# Patient Record
Sex: Male | Born: 1950 | Race: White | Hispanic: No | Marital: Single | Smoking: Former smoker
Health system: Southern US, Community
[De-identification: ages and names within clinical notes are randomized; demographics above are authoritative.]

## PROBLEM LIST (undated history)

## (undated) DIAGNOSIS — F1010 Alcohol abuse, uncomplicated: Secondary | ICD-10-CM

## (undated) DIAGNOSIS — I10 Essential (primary) hypertension: Secondary | ICD-10-CM

## (undated) DIAGNOSIS — K859 Acute pancreatitis without necrosis or infection, unspecified: Secondary | ICD-10-CM

## (undated) HISTORY — PX: PANCREAS SURGERY: SHX731

## (undated) HISTORY — PX: KNEE SURGERY: SHX244

---

## 2010-11-09 ENCOUNTER — Emergency Department (HOSPITAL_COMMUNITY)
Admission: EM | Admit: 2010-11-09 | Discharge: 2010-11-09 | Payer: Self-pay | Source: Home / Self Care | Admitting: Emergency Medicine

## 2011-01-17 ENCOUNTER — Emergency Department (HOSPITAL_BASED_OUTPATIENT_CLINIC_OR_DEPARTMENT_OTHER)
Admission: EM | Admit: 2011-01-17 | Discharge: 2011-01-17 | Disposition: A | Discharge status: Home / Self Care | Payer: BC Managed Care – PPO | Attending: Emergency Medicine | Admitting: Emergency Medicine

## 2011-01-17 DIAGNOSIS — R197 Diarrhea, unspecified: Secondary | ICD-10-CM | POA: Insufficient documentation

## 2011-01-17 DIAGNOSIS — R1110 Vomiting, unspecified: Secondary | ICD-10-CM | POA: Insufficient documentation

## 2011-01-17 DIAGNOSIS — F1010 Alcohol abuse, uncomplicated: Secondary | ICD-10-CM | POA: Insufficient documentation

## 2011-01-17 DIAGNOSIS — E860 Dehydration: Secondary | ICD-10-CM | POA: Insufficient documentation

## 2011-01-17 DIAGNOSIS — E119 Type 2 diabetes mellitus without complications: Secondary | ICD-10-CM | POA: Insufficient documentation

## 2011-01-17 DIAGNOSIS — F17200 Nicotine dependence, unspecified, uncomplicated: Secondary | ICD-10-CM | POA: Insufficient documentation

## 2011-01-17 LAB — CBC
HCT: 45 % (ref 39.0–52.0)
MCH: 33 pg (ref 26.0–34.0)
MCV: 90.5 fL (ref 78.0–100.0)
RBC: 4.97 MIL/uL (ref 4.22–5.81)
RDW: 12.3 % (ref 11.5–15.5)
WBC: 7.4 10*3/uL (ref 4.0–10.5)

## 2011-01-17 LAB — URINALYSIS, ROUTINE W REFLEX MICROSCOPIC
Bilirubin Urine: NEGATIVE
Ketones, ur: 15 mg/dL — AB
Protein, ur: 100 mg/dL — AB
Urobilinogen, UA: 0.2 mg/dL (ref 0.0–1.0)

## 2011-01-17 LAB — COMPREHENSIVE METABOLIC PANEL
Alkaline Phosphatase: 188 U/L — ABNORMAL HIGH (ref 39–117)
BUN: 20 mg/dL (ref 6–23)
Chloride: 97 mEq/L (ref 96–112)
Creatinine, Ser: 0.7 mg/dL (ref 0.4–1.5)
Glucose, Bld: 227 mg/dL — ABNORMAL HIGH (ref 70–99)
Potassium: 5 mEq/L (ref 3.5–5.1)
Total Bilirubin: 0.8 mg/dL (ref 0.3–1.2)

## 2011-01-17 LAB — DIFFERENTIAL
Eosinophils Absolute: 0 10*3/uL (ref 0.0–0.7)
Eosinophils Relative: 1 % (ref 0–5)
Lymphocytes Relative: 19 % (ref 12–46)
Lymphs Abs: 1.4 10*3/uL (ref 0.7–4.0)
Monocytes Relative: 9 % (ref 3–12)

## 2011-01-17 LAB — URINE MICROSCOPIC-ADD ON

## 2011-01-17 LAB — GLUCOSE, CAPILLARY
Glucose-Capillary: 246 mg/dL — ABNORMAL HIGH (ref 70–99)
Glucose-Capillary: 198 mg/dL — ABNORMAL HIGH (ref 70–99)

## 2011-01-17 LAB — POCT TOXICOLOGY PANEL

## 2011-01-17 LAB — ETHANOL: Alcohol, Ethyl (B): 241 mg/dL — ABNORMAL HIGH (ref 0–10)

## 2011-01-17 LAB — LIPASE, BLOOD: Lipase: 37 U/L (ref 23–300)

## 2011-01-19 LAB — URINE CULTURE
Colony Count: NO GROWTH
Culture  Setup Time: 201202122012

## 2012-07-06 ENCOUNTER — Inpatient Hospital Stay (HOSPITAL_COMMUNITY)
Admission: EM | Admit: 2012-07-06 | Discharge: 2012-07-11 | DRG: 750 | Disposition: A | Discharge status: Home / Self Care | Payer: BC Managed Care – PPO | Attending: Internal Medicine | Admitting: Internal Medicine

## 2012-07-06 ENCOUNTER — Encounter (HOSPITAL_COMMUNITY): Payer: Self-pay | Admitting: *Deleted

## 2012-07-06 DIAGNOSIS — Z794 Long term (current) use of insulin: Secondary | ICD-10-CM

## 2012-07-06 DIAGNOSIS — F10931 Alcohol use, unspecified with withdrawal delirium: Principal | ICD-10-CM | POA: Diagnosis present

## 2012-07-06 DIAGNOSIS — R195 Other fecal abnormalities: Secondary | ICD-10-CM | POA: Diagnosis present

## 2012-07-06 DIAGNOSIS — J690 Pneumonitis due to inhalation of food and vomit: Secondary | ICD-10-CM | POA: Diagnosis present

## 2012-07-06 DIAGNOSIS — Z9119 Patient's noncompliance with other medical treatment and regimen: Secondary | ICD-10-CM

## 2012-07-06 DIAGNOSIS — F1020 Alcohol dependence, uncomplicated: Secondary | ICD-10-CM | POA: Diagnosis present

## 2012-07-06 DIAGNOSIS — E119 Type 2 diabetes mellitus without complications: Secondary | ICD-10-CM | POA: Diagnosis present

## 2012-07-06 DIAGNOSIS — W64XXXA Exposure to other animate mechanical forces, initial encounter: Secondary | ICD-10-CM | POA: Diagnosis present

## 2012-07-06 DIAGNOSIS — F17200 Nicotine dependence, unspecified, uncomplicated: Secondary | ICD-10-CM | POA: Diagnosis present

## 2012-07-06 DIAGNOSIS — W5503XA Scratched by cat, initial encounter: Secondary | ICD-10-CM

## 2012-07-06 DIAGNOSIS — Z79899 Other long term (current) drug therapy: Secondary | ICD-10-CM

## 2012-07-06 DIAGNOSIS — F1010 Alcohol abuse, uncomplicated: Secondary | ICD-10-CM | POA: Diagnosis present

## 2012-07-06 DIAGNOSIS — Z91199 Patient's noncompliance with other medical treatment and regimen due to unspecified reason: Secondary | ICD-10-CM

## 2012-07-06 DIAGNOSIS — H109 Unspecified conjunctivitis: Secondary | ICD-10-CM | POA: Diagnosis present

## 2012-07-06 DIAGNOSIS — IMO0002 Reserved for concepts with insufficient information to code with codable children: Secondary | ICD-10-CM | POA: Diagnosis present

## 2012-07-06 DIAGNOSIS — E871 Hypo-osmolality and hyponatremia: Secondary | ICD-10-CM | POA: Diagnosis present

## 2012-07-06 DIAGNOSIS — F10231 Alcohol dependence with withdrawal delirium: Principal | ICD-10-CM | POA: Diagnosis present

## 2012-07-06 LAB — CBC WITH DIFFERENTIAL/PLATELET
Basophils Absolute: 0 10*3/uL (ref 0.0–0.1)
Basophils Relative: 1 % (ref 0–1)
Eosinophils Relative: 1 % (ref 0–5)
HCT: 40.8 % (ref 39.0–52.0)
MCHC: 36 g/dL (ref 30.0–36.0)
Monocytes Absolute: 0.3 10*3/uL (ref 0.1–1.0)
Neutro Abs: 3 10*3/uL (ref 1.7–7.7)
Platelets: 138 10*3/uL — ABNORMAL LOW (ref 150–400)
RDW: 12.7 % (ref 11.5–15.5)

## 2012-07-06 LAB — POCT I-STAT, CHEM 8
Calcium, Ion: 0.89 mmol/L — ABNORMAL LOW (ref 1.12–1.23)
Glucose, Bld: 154 mg/dL — ABNORMAL HIGH (ref 70–99)
HCT: 45 % (ref 39.0–52.0)
Hemoglobin: 15.3 g/dL (ref 13.0–17.0)
Potassium: 4.6 mEq/L (ref 3.5–5.1)

## 2012-07-06 MED ORDER — SODIUM CHLORIDE 0.9 % IV BOLUS (SEPSIS)
1000.0000 mL | Freq: Once | INTRAVENOUS | Status: AC
  Administered 2012-07-06: 1000 mL via INTRAVENOUS

## 2012-07-06 NOTE — ED Notes (Signed)
ONG:[id number]<WU> Expected date:<BR> Expected time:<BR> Means of arrival:<BR> Comments:<BR> EMS/found unresponsive

## 2012-07-06 NOTE — ED Notes (Signed)
Per EMS: pt coming from home. Pt works at [employer], employee called 911 because pt had not shown up to work in three days. Upon EMS arrival pt was sitting on couch watching TV. Pt was very lethargic, slowed to respond, cgb 156, smell of ETOH. Pt admitted to 3-4 glasses of wine today. Pt is alert and oriented. EMS reports positive orthostatic changes. Pt given 700 mL of NS en route. Pt has no complaints. EMS reports negative stroke scale

## 2012-07-06 NOTE — ED Provider Notes (Addendum)
History     CSN: [id number]  Arrival date & time 07/06/12  2025   First MD Initiated Contact with Patient 07/06/12 2136      Chief Complaint  Patient presents with  . Dehydration    (Consider location/radiation/quality/duration/timing/severity/associated sxs/prior treatment) HPI Comments: Patient states, that he was placed on FLMA. by his employer.  On Sunday because of labile blood sugars that he is treating on his own by changing his insulin which he states he does not need a prescription for, his employer became concerned because he did not show up to work today or for the past 2, days.  When EMS arrived, they could see the patient.  Sitting, in the chair, but he was not responding to the air.  [place], police arrived, and broke in the door.  Patient was responsive but very blas about the intrusion.  He admits to drinking 3-4 glasses of wine a day.  He denies any fever or headache, weakness, nausea, vomiting, diarrhea, URI symptoms, chest pain, cough.  States his blood sugar has been between 140 and 160.   The history is provided by the patient.    Past Medical History  Diagnosis Date  . Diabetes mellitus     History reviewed. No pertinent past surgical history.  History reviewed. No pertinent family history.  History  Substance Use Topics  . Smoking status: Current Everyday Smoker  . Smokeless tobacco: Not on file  . Alcohol Use: Yes      Review of Systems  Constitutional: Negative for fever.  HENT: Negative for congestion, sore throat and rhinorrhea.   Gastrointestinal: Negative for nausea, vomiting, abdominal pain, diarrhea and constipation.  Genitourinary: Negative for dysuria.  Musculoskeletal: Negative for myalgias and joint swelling.  Skin: Positive for wound.  Neurological: Positive for headaches. Negative for dizziness, speech difficulty and weakness.  Psychiatric/Behavioral: Negative for confusion.    Allergies  Review of patient's allergies indicates no  known allergies.  Home Medications   Current Outpatient Rx  Name Route Sig Dispense Refill  . INSULIN ISOPHANE HUMAN 100 UNIT/ML [place] SUSP Subcutaneous Inject 6 Units into the skin 2 (two) times daily.    . INSULIN REGULAR HUMAN 100 UNIT/ML IJ SOLN Subcutaneous Inject 10 Units into the skin 2 (two) times daily before a meal.      BP 130/69  Pulse 73  Temp 97.9 F (36.6 C) (Oral)  Resp 16  SpO2 95%  Physical Exam  Constitutional: He is oriented to person, place, and time. He appears well-developed and well-nourished. No distress.       Patient is very disheveled  HENT:  Head: Normocephalic.  Eyes: Pupils are equal, round, and reactive to light.  Neck: Normal range of motion.  Cardiovascular: Normal rate.   Pulmonary/Chest: Effort normal.  Abdominal: Soft. There is no tenderness.  Musculoskeletal: Normal range of motion. He exhibits no edema.  Neurological: He is alert and oriented to person, place, and time.  Skin: Skin is warm. He is not diaphoretic.       There are multiple scratches on the anterior surface of the right, forearm.  Patient states this is from his cat  Psychiatric: He has a normal mood and affect.    ED Course  Procedures (including critical care time)  Labs Reviewed  CBC WITH DIFFERENTIAL - Abnormal; Notable for the following:    Platelets 138 (*)     All other components within normal limits  ETHANOL - Abnormal; Notable for the following:  Alcohol, Ethyl (B) 345 (*)     All other components within normal limits  POCT I-STAT, CHEM 8 - Abnormal; Notable for the following:    Sodium 130 (*)     Chloride 94 (*)     Glucose, Bld 154 (*)     Calcium, Ion 0.89 (*)     All other components within normal limits  GLUCOSE, CAPILLARY - Abnormal; Notable for the following:    Glucose-Capillary 153 (*)     All other components within normal limits  ETHANOL   No results found.   No diagnosis found. ED ECG REPORT   Date: 09/07/2012  EKG Time: 8:30 PM   Rate: 85  Rhythm: normal sinus rhythm,  there are no previous tracings available for comparison  Axis: normal  Intervals:none  ST&T Change:borderline T wave changes  Narrative Interpretation: borderline EKG             MDM   Call 345, will hydrate patient and redraw alcohol level in the morning, when he is sober.  Discharge home        Arman Filter, NP 07/07/12 0981  Arman Filter, NP 09/07/12 2031

## 2012-07-07 ENCOUNTER — Encounter (HOSPITAL_COMMUNITY): Payer: Self-pay

## 2012-07-07 DIAGNOSIS — IMO0002 Reserved for concepts with insufficient information to code with codable children: Secondary | ICD-10-CM

## 2012-07-07 DIAGNOSIS — W64XXXA Exposure to other animate mechanical forces, initial encounter: Secondary | ICD-10-CM

## 2012-07-07 DIAGNOSIS — E871 Hypo-osmolality and hyponatremia: Secondary | ICD-10-CM

## 2012-07-07 DIAGNOSIS — F1010 Alcohol abuse, uncomplicated: Secondary | ICD-10-CM | POA: Diagnosis present

## 2012-07-07 DIAGNOSIS — E119 Type 2 diabetes mellitus without complications: Secondary | ICD-10-CM

## 2012-07-07 LAB — URINE MICROSCOPIC-ADD ON

## 2012-07-07 LAB — GLUCOSE, CAPILLARY: Glucose-Capillary: 152 mg/dL — ABNORMAL HIGH (ref 70–99)

## 2012-07-07 LAB — URINALYSIS, ROUTINE W REFLEX MICROSCOPIC
Bilirubin Urine: NEGATIVE
Nitrite: NEGATIVE
Protein, ur: 100 mg/dL — AB
Urobilinogen, UA: 1 mg/dL (ref 0.0–1.0)

## 2012-07-07 MED ORDER — CIPROFLOXACIN HCL 0.3 % OP SOLN
2.0000 [drp] | OPHTHALMIC | Status: DC
  Filled 2012-07-07: qty 2.5

## 2012-07-07 MED ORDER — THIAMINE HCL 100 MG/ML IJ SOLN
INTRAVENOUS | Status: DC
  Administered 2012-07-07: 15:00:00 via INTRAVENOUS
  Filled 2012-07-07 (×2): qty 1000

## 2012-07-07 MED ORDER — ADULT MULTIVITAMIN W/MINERALS CH
1.0000 | ORAL_TABLET | Freq: Every day | ORAL | Status: DC
  Administered 2012-07-07 – 2012-07-11 (×5): 1 via ORAL
  Filled 2012-07-07 (×5): qty 1

## 2012-07-07 MED ORDER — CIPROFLOXACIN HCL 0.3 % OP SOLN
2.0000 [drp] | OPHTHALMIC | Status: DC
  Administered 2012-07-09 – 2012-07-11 (×11): 2 [drp] via OPHTHALMIC
  Filled 2012-07-07: qty 2.5

## 2012-07-07 MED ORDER — AMOXICILLIN-POT CLAVULANATE 875-125 MG PO TABS
1.0000 | ORAL_TABLET | Freq: Once | ORAL | Status: AC
  Administered 2012-07-07: 1 via ORAL
  Filled 2012-07-07: qty 1

## 2012-07-07 MED ORDER — SODIUM CHLORIDE 0.9 % IV SOLN
INTRAVENOUS | Status: DC
  Administered 2012-07-08: 06:00:00 via INTRAVENOUS

## 2012-07-07 MED ORDER — LORAZEPAM 1 MG PO TABS
1.0000 mg | ORAL_TABLET | Freq: Once | ORAL | Status: AC
  Administered 2012-07-07: 1 mg via ORAL
  Filled 2012-07-07: qty 1

## 2012-07-07 MED ORDER — AMOXICILLIN-POT CLAVULANATE 500-125 MG PO TABS: 1.0000 | ORAL_TABLET | Freq: Three times a day (TID) | ORAL | Status: DC

## 2012-07-07 MED ORDER — DEXTROSE 5 % IV SOLN
500.0000 mg | Freq: Once | INTRAVENOUS | Status: AC
  Administered 2012-07-07: 500 mg via INTRAVENOUS
  Filled 2012-07-07: qty 500

## 2012-07-07 MED ORDER — ONDANSETRON HCL 4 MG/2ML IJ SOLN: 4.0000 mg | Freq: Four times a day (QID) | INTRAMUSCULAR | Status: DC | PRN

## 2012-07-07 MED ORDER — TETANUS-DIPHTH-ACELL PERTUSSIS 5-2.5-18.5 LF-MCG/0.5 IM SUSP
0.5000 mL | Freq: Once | INTRAMUSCULAR | Status: AC
  Administered 2012-07-07: 0.5 mL via INTRAMUSCULAR
  Filled 2012-07-07: qty 0.5

## 2012-07-07 MED ORDER — LORAZEPAM 2 MG/ML IJ SOLN
0.0000 mg | Freq: Four times a day (QID) | INTRAMUSCULAR | Status: AC
  Administered 2012-07-07 – 2012-07-08 (×2): 1 mg via INTRAVENOUS
  Filled 2012-07-07 (×2): qty 1

## 2012-07-07 MED ORDER — DEXTROSE 5 % IV SOLN
500.0000 mg | INTRAVENOUS | Status: DC
  Administered 2012-07-08: 500 mg via INTRAVENOUS
  Filled 2012-07-07 (×2): qty 500

## 2012-07-07 MED ORDER — CHLORDIAZEPOXIDE HCL 5 MG PO CAPS
10.0000 mg | ORAL_CAPSULE | Freq: Four times a day (QID) | ORAL | Status: DC
  Administered 2012-07-07 – 2012-07-09 (×11): 10 mg via ORAL
  Filled 2012-07-07 (×2): qty 2
  Filled 2012-07-07 (×4): qty 1
  Filled 2012-07-07: qty 2
  Filled 2012-07-07: qty 1
  Filled 2012-07-07: qty 2
  Filled 2012-07-07: qty 1
  Filled 2012-07-07: qty 2
  Filled 2012-07-07 (×2): qty 1

## 2012-07-07 MED ORDER — HYDROCODONE-ACETAMINOPHEN 5-325 MG PO TABS: 1.0000 | ORAL_TABLET | ORAL | Status: DC | PRN

## 2012-07-07 MED ORDER — SODIUM CHLORIDE 0.9 % IV SOLN
3.0000 g | Freq: Four times a day (QID) | INTRAVENOUS | Status: DC
  Administered 2012-07-07 – 2012-07-09 (×8): 3 g via INTRAVENOUS
  Filled 2012-07-07 (×10): qty 3

## 2012-07-07 MED ORDER — SODIUM CHLORIDE 0.9 % IV SOLN: INTRAVENOUS | Status: DC

## 2012-07-07 MED ORDER — THIAMINE HCL 100 MG/ML IJ SOLN
INTRAVENOUS | Status: DC
  Filled 2012-07-07 (×2): qty 1000

## 2012-07-07 MED ORDER — LORAZEPAM 2 MG/ML IJ SOLN: 1.0000 mg | Freq: Four times a day (QID) | INTRAMUSCULAR | Status: DC | PRN

## 2012-07-07 MED ORDER — SODIUM CHLORIDE 0.9 % IV BOLUS (SEPSIS)
1000.0000 mL | Freq: Once | INTRAVENOUS | Status: AC
  Administered 2012-07-07: 1000 mL via INTRAVENOUS

## 2012-07-07 MED ORDER — SODIUM CHLORIDE 0.9 % IJ SOLN
3.0000 mL | Freq: Two times a day (BID) | INTRAMUSCULAR | Status: DC
  Administered 2012-07-07 – 2012-07-11 (×5): 3 mL via INTRAVENOUS

## 2012-07-07 MED ORDER — FOLIC ACID 1 MG PO TABS
1.0000 mg | ORAL_TABLET | Freq: Every day | ORAL | Status: DC
  Administered 2012-07-07: 1 mg via ORAL
  Filled 2012-07-07: qty 1

## 2012-07-07 MED ORDER — THIAMINE HCL 100 MG/ML IJ SOLN: 100.0000 mg | Freq: Every day | INTRAMUSCULAR | Status: DC

## 2012-07-07 MED ORDER — LORAZEPAM 1 MG PO TABS: 1.0000 mg | ORAL_TABLET | Freq: Four times a day (QID) | ORAL | Status: AC | PRN

## 2012-07-07 MED ORDER — PNEUMOCOCCAL VAC POLYVALENT 25 MCG/0.5ML IJ INJ
0.5000 mL | INJECTION | INTRAMUSCULAR | Status: AC
  Administered 2012-07-08: 0.5 mL via INTRAMUSCULAR
  Filled 2012-07-07: qty 0.5

## 2012-07-07 MED ORDER — CIPROFLOXACIN HCL 0.3 % OP SOLN
2.0000 [drp] | OPHTHALMIC | Status: AC
  Administered 2012-07-07 – 2012-07-09 (×22): 2 [drp] via OPHTHALMIC
  Filled 2012-07-07 (×2): qty 2.5

## 2012-07-07 MED ORDER — GUAIFENESIN-DM 100-10 MG/5ML PO SYRP: 5.0000 mL | ORAL_SOLUTION | ORAL | Status: DC | PRN

## 2012-07-07 MED ORDER — ALBUTEROL SULFATE (5 MG/ML) 0.5% IN NEBU: 2.5000 mg | INHALATION_SOLUTION | RESPIRATORY_TRACT | Status: DC | PRN

## 2012-07-07 MED ORDER — VITAMIN B-1 100 MG PO TABS
100.0000 mg | ORAL_TABLET | Freq: Every day | ORAL | Status: DC
  Administered 2012-07-07: 100 mg via ORAL
  Filled 2012-07-07: qty 1

## 2012-07-07 MED ORDER — LORAZEPAM 2 MG/ML IJ SOLN
0.0000 mg | Freq: Two times a day (BID) | INTRAMUSCULAR | Status: DC
  Administered 2012-07-09: 1 mg via INTRAVENOUS
  Filled 2012-07-07: qty 1

## 2012-07-07 MED ORDER — INSULIN ASPART 100 UNIT/ML ~~LOC~~ SOLN
0.0000 [IU] | SUBCUTANEOUS | Status: DC
  Administered 2012-07-07: 2 [IU] via SUBCUTANEOUS

## 2012-07-07 MED ORDER — LORAZEPAM 2 MG/ML IJ SOLN
1.0000 mg | Freq: Once | INTRAMUSCULAR | Status: AC
  Administered 2012-07-07: 1 mg via INTRAVENOUS
  Filled 2012-07-07: qty 1

## 2012-07-07 MED ORDER — ONDANSETRON HCL 4 MG PO TABS: 4.0000 mg | ORAL_TABLET | Freq: Four times a day (QID) | ORAL | Status: DC | PRN

## 2012-07-07 NOTE — H&P (Addendum)
[facility]                                                                                    Patient Demographics  Benjamin [name], is a 61 y.o. male  CSN: [id number]  MRN: [id number]  DOB - [birth year]  Admit Date - 07/06/2012  Outpatient Primary MD for the patient is No primary provider on file.   With History of -  Past Medical History  Diagnosis Date  . Diabetes mellitus       History reviewed. No pertinent past surgical history.  in for   Chief Complaint  Patient presents with  . Dehydration     HPI  Benjamin [name]  is a 61 y.o. male, history of severe alcohol abuse who drinks about 1-1/2-2 bottles of vodka everyday who was brought by EMS yesterday after he did not show for work for couple of days, his employer then contacted the authorities, EMS and police broke his house door and found patient sitting in a chair did not have any subjective complaints however he appeared to be intoxicated, he was brought to [facility] where his serum alcohol level was 345, he should and does admit to drinking 1-1/2-2 bottles of vodka daily the past several years, he also says that he has been attacked by his cat and that his cat has been eating his flesh from time to time, various scratch marks were noted on his body all over. His lab work was suggestive of hyponatremia mild leukocytosis with possible aspiration pneumonia.  Patient was to be discharged home but he appeared to be going in early DTs, likely due to heavy alcohol intake on a daily basis which has lowered his DT threshold.   Review of Systems  -  although unreliable but current review of systems is negative  In addition to the HPI above,   No Fever-chills, No Headache, No changes with Vision or hearing, No problems swallowing food or Liquids, No Chest pain, Cough or Shortness of Breath, No Abdominal pain, No Nausea or Vommitting, Bowel movements are regular, No Blood in stool or Urine, No dysuria, No new skin  rashes or bruises, No new joints pains-aches,  No new weakness, tingling, numbness in any extremity, No recent weight gain or loss, No polyuria, polydypsia or polyphagia, No significant Mental Stressors.  A full 10 point Review of Systems was done, except as stated above, all other Review of Systems were negative.   Social History History  Substance Use Topics  . Smoking status: Current Everyday Smoker  . Smokeless tobacco: Not on file  . Alcohol Use: Yes     Family History His father was an alcoholic per patient.  Prior to Admission medications   Medication Sig Start Date End Date Taking? Authorizing Provider  insulin NPH (HUMULIN N,NOVOLIN N) 100 UNIT/ML injection Inject 6 Units into the skin 2 (two) times daily.   Yes Historical Provider, MD  insulin regular (NOVOLIN R,HUMULIN R) 100 units/mL injection Inject 10 Units into the skin 2 (two) times daily before a meal.   Yes Historical Provider, MD  amoxicillin-clavulanate (AUGMENTIN) 500-125 MG per tablet Take 1 tablet (500 mg  total) by mouth every 8 (eight) hours. 07/07/12 07/17/12  Dorthula Matas, PA    No Known Allergies  Physical Exam  Vitals  Blood pressure 159/80, pulse 89, temperature 97.9 F (36.6 C), temperature source Oral, resp. rate 21, SpO2 98.00%.   1. General poorly kept middle-aged Caucasian male lying in bed in NAD,    2. Normal affect and insight, Not Suicidal or Homicidal, Awake Alert, Oriented X 2  3. No F.N deficits, ALL C.Nerves Intact, Strength 5/5 all 4 extremities, Sensation intact all 4 extremities, Plantars down going.  4. Ears appear Normal, he has evidence of bilateral conjunctivitis with severe crusting in both eyes, Conjunctivae clear, PERRLA. Moist Oral Mucosa.  5. Supple Neck, No JVD, No cervical lymphadenopathy appriciated, No Carotid Bruits.  6. Symmetrical Chest wall movement, Good air movement bilaterally, CTAB.  7. RRR, No Gallops, Rubs or Murmurs, No Parasternal Heave.  8.  Positive Bowel Sounds, Abdomen Soft, Non tender, No organomegaly appriciated,No rebound -guarding or rigidity.  9.  No Cyanosis, Normal Skin Turgor, multiple chronic skin aberrations noted in all 4 extremities mostly in his right arm  10. Good muscle tone,  joints appear normal , no effusions, Normal ROM.  11. No Palpable Lymph Nodes in Neck or Axillae    Data Review  CBC  Lab 07/06/12 2232 07/06/12 2215  WBC -- 4.2  HGB 15.3 14.7  HCT 45.0 40.8  PLT -- 138*  MCV -- 90.5  MCH -- 32.6  MCHC -- 36.0  RDW -- 12.7  LYMPHSABS -- 0.8  MONOABS -- 0.3  EOSABS -- 0.0  BASOSABS -- 0.0  BANDABS -- --   ------------------------------------------------------------------------------------------------------------------  Chemistries   Lab 07/06/12 2232  NA 130*  K 4.6  CL 94*  CO2 --  GLUCOSE 154*  BUN 11  CREATININE 1.20  CALCIUM --  MG --  AST --  ALT --  ALKPHOS --  BILITOT --   ------------------------------------------------------------------------------------------------------------------ CrCl is unknown because there is no height on file for the current visit. ------------------------------------------------------------------------------------------------------------------ No results found for this basename: TSH,T4TOTAL,FREET3,T3FREE,THYROIDAB in the last 72 hours   Coagulation profile No results found for this basename: INR:5,PROTIME:5 in the last 168 hours ------------------------------------------------------------------------------------------------------------------- No results found for this basename: DDIMER:2 in the last 72 hours -------------------------------------------------------------------------------------------------------------------  Cardiac Enzymes No results found for this basename: CK:3,CKMB:3,TROPONINI:3,MYOGLOBIN:3 in the last 168  hours ------------------------------------------------------------------------------------------------------------------ No components found with this basename: POCBNP:3   ---------------------------------------------------------------------------------------------------------------  Urinalysis    Component Value Date/Time   COLORURINE YELLOW 01/17/2011 1427   APPEARANCEUR CLEAR 01/17/2011 1427   LABSPEC 1.016 01/17/2011 1427   PHURINE 5.0 01/17/2011 1427   HGBUR MODERATE* 01/17/2011 1427   BILIRUBINUR NEGATIVE 01/17/2011 1427   KETONESUR 15* 01/17/2011 1427   PROTEINUR 100* 01/17/2011 1427   UROBILINOGEN 0.2 01/17/2011 1427   NITRITE NEGATIVE 01/17/2011 1427   LEUKOCYTESUR NEGATIVE 01/17/2011 1427       Assessment & Plan   1. Early DTs in a patient who drinks heavy amounts of alcohol on a daily basis- plan is to keep the patient and step-down overnight, he will be placed on IV banana bag, on scheduled oral Librium along with IV Ativan per CIWA protocol, fall and aspiration precautions were now n.p.o. except for medications. Have counseled the patient to quit alcohol and smoking.    2. Aspiration pneumonia - continue aspiration precautions, n.p.o. except medications, will place him on IV Unasyn and azithromycin. He is afebrile if temperature is over 100 blood cultures will be obtained,  sputum cultures if he produces sputum.   3. Diabetes mellitus type 2- for now every 6 hours sliding scale insulin.   4. Hyponatremia- secondary to heavy alcohol use, will check serum osmolality, urine osmolality and urine sodium, gentle IV fluids, repeat BMP in the morning.   5. Severe bilateral conjunctivitis- patient will be placed on Cipro eyedrops every 2 hours while awake.    DVT Prophylaxis  SCDs   AM Labs Ordered, also please review Full Orders  Family Communication: Admission, patients condition and plan of care including tests being ordered have been discussed with the patient who  indicates understanding and agree with the plan and Code Status.  Code Status full  Disposition Plan: home  Time spent in minutes : 35  Condition GUARDED   Susa Raring K M.D on 07/07/2012 at 1:50 PM  Between 7am to 7pm - Pager - [phone]  After 7pm go to [email or web address] - password [email or web address]  And look for the night coverage person covering me after hours  [facility] Office  [phone]

## 2012-07-07 NOTE — Consult Note (Signed)
WOC consult Note Reason for Consult:multiple scratches and abraisons (Patient states he has a cat which occasionally scratches and bites him.  The cat has had "all of its shots".) Noted that patient has poor hygiene and will benefit from our standard care provision of bathing and moisturzing. Wound type:traumatic Pressure Ulcer POA: No Measurement:4cm x 2.5cm x .2cm Wound RUE:[id number] thickness skin injury, moist, pink, granulating Drainage (amount, consistency, odor) scant amount serosanguinous exudate Periwound:intact with scattered healing scratches noted. Dressing procedure/placement/frequency: a moisture retentive dressing is indicated over the most severe of scratches-the right FA presents with partial thickness tissue loss.  I will continue the previously placed petrolatum dressing and have ordered the same, daily.  Other superficial areas will benefit from cleansing and lubrication using our house emollient products. I will not follow.  Please re-consult if needed. Thanks, Ladona Mow, MSN, RN, [facility], CWOCN [phone])

## 2012-07-07 NOTE — Progress Notes (Signed)
Benjamin [name], is a 61 y.o. male,   MRN: [id number]  -  DOB - 07-19-1951  Outpatient Primary MD for the patient is No primary provider on file.  in for    Chief Complaint  Patient presents with  . Dehydration     Blood pressure 159/80, pulse 89, temperature 97.9 F (36.6 C), temperature source Oral, resp. rate 21, SpO2 98.00%.  Active Problems:  Hyponatremia  ETOH abuse  Diabetes mellitus  61 yo hx DM, was found in home yesterday with altered mental status. Had not been to work for 3 days so employer sent someone to home to check on him. Was found altered and transported to ED. ETOH level 345. Given fluids and plan was to discharge in am. This am developed tremors, LE weakness, HTN, tachycardia, agitation. CIWA protocol initiated. In addition, sodium 130. Pt also with several wounds to arms reportedly from cat scratches and bites. Augmentin started in Ed.   Will admit to SD   [name], NP

## 2012-07-07 NOTE — Progress Notes (Addendum)
ANTIBIOTIC CONSULT NOTE - INITIAL  Pharmacy Consult for Unasyn Indication: r/o PNA (possible aspiration)  No Known Allergies  Patient Measurements:     Vital Signs: BP: 159/80 mmHg (08/02 1310) Pulse Rate: 89  (08/02 1310) Intake/Output from previous day:   Intake/Output from this shift:    Labs:  [facility] 07/06/12 2232 07/06/12 2215  WBC -- 4.2  HGB 15.3 14.7  PLT -- 138*  LABCREA -- --  CREATININE 1.20 --   CrCl is unknown because there is no height on file for the current visit. No results found for this basename: VANCOTROUGH:2,VANCOPEAK:2,VANCORANDOM:2,GENTTROUGH:2,GENTPEAK:2,GENTRANDOM:2,TOBRATROUGH:2,TOBRAPEAK:2,TOBRARND:2,AMIKACINPEAK:2,AMIKACINTROU:2,AMIKACIN:2, in the last 72 hours   Microbiology: No results found for this or any previous visit (from the past 720 hour(s)).  Medical History: Past Medical History  Diagnosis Date  . Diabetes mellitus     Medications:  Anti-infectives     Start     Dose/Rate Route Frequency Ordered Stop   07/07/12 1400   Ampicillin-Sulbactam (UNASYN) 3 g in sodium chloride 0.9 % 100 mL IVPB        3 g 100 mL/hr over 60 Minutes Intravenous Every 6 hours 07/07/12 1350     07/07/12 0900  amoxicillin-clavulanate (AUGMENTIN) 875-125 MG per tablet 1 tablet       1 tablet Oral  Once 07/07/12 0856 07/07/12 0915   07/07/12 0000   amoxicillin-clavulanate (AUGMENTIN) 500-125 MG per tablet  Status:  Discontinued        1 tablet Oral Every 8 hours 07/07/12 0953 07/07/12    07/07/12 0000  amoxicillin-clavulanate (AUGMENTIN) 500-125 MG per tablet       1 tablet Oral Every 8 hours 07/07/12 0955 07/17/12 2359         Assessment:  61 yo male found at home, intoxicated, semi-responsive.   Numerous cat bites/scratches noted. Pt has hx DM  Unasyn ordered for r/o PNA, risk of aspiration with intoxication.  Renal fx wnl, Cl about 67 ml/min  Augmentin 875 mg po given in ED this am  Unable to d/c patient due to tremors. Admit for  detox.  Goal of Therapy:  Medication dose/schedule appropriate for renal function, treatment.  Plan:  Unasyn 3gm q6h Follow renal function  Otho Bellows PharmD Pager [phone] 07/07/2012,1:52 PM   RN released consult which stated patient needs Unasyn and Azithromycin for CAP with aspiration.  Patient already on Unasyn as above and Azithromycin 500 mg IV x 1 ordered by MD for 1400 today (not charted as given yet).  Plan: Azithromycin 500 mg IV q24h (first dose tomorrow at 1400).  F/u LOT.   Geoffry Paradise, PharmD, BCPS Pager: [phone] 3:15 PM

## 2012-07-07 NOTE — ED Notes (Signed)
Pt still shaking to the point he is unable to walk.  Could get to standing position, but was unsteady and fell back onto bed after one step.  Will inform PA.

## 2012-07-07 NOTE — ED Provider Notes (Signed)
Contact info from patients employer Emergency Contact listed in his applications Name unknown (sister maybe?) [phone]  Employer ; [employer]: [phone]     Pt handed off to me by Sabino Dick, NP.  Pt brought to ER by EMS after employer became concerned about him not showing up to work for three days. Pt was drunk in house. Upon arrival to ED very intoxicated. Cat scratches to arms noted. Pt drinks 1 bottle of pinot Grigio per day.  9:41 AM 07/07/2012  Pt says he is feeling shaky, i believe he is withdrawing from alcohol. Symptoms are very mild. Will order Ativan. Pt informs me that his cat is weird about his cat food and whenever his bowl is empty he chew and scratches on his arms. Pt is not currently taking abx and tetanus status is unknown. Pt is diabetic as well, which is concerning for multiple wounds to arms. I offered patient detox from alcohol and he declined but will think about it. Pt given resources for alcohol detox.   [employer] informed me that pt had streaks of blood in stool. Hemoccult test ordered and it is positive. Hemoglobin is 14.7. Will give patient referral to GI. No abdominal pain.    Given in ED by me: Augmentin Tetanus shot Ativan   Dx: Cat scratches Alcohol abuse Diabetes Type I Hemoccult positive   12:30 PM When patient was supposed to be discharged the patient was too shaky and unable to walk. His ride, which is his employer does not feel comfortable taking him home. Pt unable to ambulate, therefore being admitted for alcohol withdrawal.   I have initiated withdrawal protocol.   Admit to [facility], Team 8, Dr. Virginia Rochester, Step-Down   Dorthula Matas, PA 07/07/12 [address], [place] 07/07/12 1651

## 2012-07-07 NOTE — ED Notes (Signed)
Pt shaking unable to walk at this time.

## 2012-07-08 LAB — GLUCOSE, CAPILLARY
Glucose-Capillary: 113 mg/dL — ABNORMAL HIGH (ref 70–99)
Glucose-Capillary: 114 mg/dL — ABNORMAL HIGH (ref 70–99)
Glucose-Capillary: 195 mg/dL — ABNORMAL HIGH (ref 70–99)

## 2012-07-08 LAB — HEMOGLOBIN A1C
Hgb A1c MFr Bld: 8.4 % — ABNORMAL HIGH (ref <5.7)
Mean Plasma Glucose: 194 mg/dL — ABNORMAL HIGH (ref <117)

## 2012-07-08 LAB — CBC
HCT: 33.9 % — ABNORMAL LOW (ref 39.0–52.0)
Hemoglobin: 12.1 g/dL — ABNORMAL LOW (ref 13.0–17.0)
MCH: 32.8 pg (ref 26.0–34.0)
MCHC: 35.5 g/dL (ref 30.0–36.0)
MCV: 92.4 fL (ref 78.0–100.0)

## 2012-07-08 LAB — OSMOLALITY: Osmolality: 274 mOsm/kg — ABNORMAL LOW (ref 275–300)

## 2012-07-08 LAB — BASIC METABOLIC PANEL
BUN: 9 mg/dL (ref 6–23)
Chloride: 91 mEq/L — ABNORMAL LOW (ref 96–112)
Glucose, Bld: 115 mg/dL — ABNORMAL HIGH (ref 70–99)
Potassium: 3.4 mEq/L — ABNORMAL LOW (ref 3.5–5.1)

## 2012-07-08 LAB — OSMOLALITY, URINE: Osmolality, Ur: 492 mOsm/kg (ref 390–1090)

## 2012-07-08 LAB — TSH: TSH: 2.164 u[IU]/mL (ref 0.350–4.500)

## 2012-07-08 MED ORDER — CLONIDINE HCL 0.2 MG PO TABS
0.2000 mg | ORAL_TABLET | Freq: Three times a day (TID) | ORAL | Status: DC
  Administered 2012-07-08 – 2012-07-09 (×2): 0.2 mg via ORAL
  Filled 2012-07-08 (×4): qty 1

## 2012-07-08 MED ORDER — FUROSEMIDE 20 MG PO TABS
20.0000 mg | ORAL_TABLET | Freq: Once | ORAL | Status: AC
  Administered 2012-07-08: 20 mg via ORAL
  Filled 2012-07-08: qty 1

## 2012-07-08 MED ORDER — INSULIN ASPART 100 UNIT/ML ~~LOC~~ SOLN
0.0000 [IU] | Freq: Three times a day (TID) | SUBCUTANEOUS | Status: DC
  Administered 2012-07-08: 2 [IU] via SUBCUTANEOUS
  Administered 2012-07-08 – 2012-07-09 (×2): 5 [IU] via SUBCUTANEOUS
  Administered 2012-07-09: 2 [IU] via SUBCUTANEOUS
  Administered 2012-07-09: 5 [IU] via SUBCUTANEOUS
  Administered 2012-07-10: 9 [IU] via SUBCUTANEOUS
  Administered 2012-07-10 (×2): 3 [IU] via SUBCUTANEOUS
  Administered 2012-07-11: 5 [IU] via SUBCUTANEOUS
  Administered 2012-07-11: 2 [IU] via SUBCUTANEOUS

## 2012-07-08 MED ORDER — MAGNESIUM SULFATE IN D5W 10-5 MG/ML-% IV SOLN
1.0000 g | Freq: Once | INTRAVENOUS | Status: AC
  Administered 2012-07-08: 1 g via INTRAVENOUS
  Filled 2012-07-08: qty 100

## 2012-07-08 MED ORDER — VITAMIN B-1 100 MG PO TABS
100.0000 mg | ORAL_TABLET | Freq: Every day | ORAL | Status: DC
  Administered 2012-07-08 – 2012-07-11 (×4): 100 mg via ORAL
  Filled 2012-07-08 (×4): qty 1

## 2012-07-08 MED ORDER — FOLIC ACID 1 MG PO TABS
1.0000 mg | ORAL_TABLET | Freq: Every day | ORAL | Status: DC
  Administered 2012-07-08 – 2012-07-11 (×4): 1 mg via ORAL
  Filled 2012-07-08 (×4): qty 1

## 2012-07-08 MED ORDER — DEXTROSE 50 % IV SOLN
INTRAVENOUS | Status: AC
  Administered 2012-07-08: 25 mL
  Filled 2012-07-08: qty 50

## 2012-07-08 NOTE — Evaluation (Signed)
Clinical/Bedside Swallow Evaluation Patient Details  Name: Benjamin [name] MRN: [id number] Date of Birth: 03-May-1951  Today's Date: 07/08/2012 Time: 1150- 1200; 10 minutes    Past Medical History:  Past Medical History  Diagnosis Date  . Diabetes mellitus    Past Surgical History: History reviewed. No pertinent past surgical history. HPI:  61 y.o. male, history of severe alcohol abuse with early alcohol withdrawal, aspiration pna, hyponatremia.     Assessment / Plan / Recommendation Clinical Impression  Pt with no evidence of an oropharyngeal dysphagia as an etiology of possible aspiration pna.  Rec continuing current diet/thin liquids.  no f/u recommended.    Diet Recommendation Regular;Thin liquid   Medication Administration: Whole meds with liquid Supervision: Patient able to self feed    Other  Recommendations   none  Follow Up Recommendations  None     Benjamin [name] 07/08/2012,12:00 PM

## 2012-07-08 NOTE — Progress Notes (Signed)
[facility]                                                                                Patient Demographics  Benjamin [name], is a 61 y.o. male  YTK:[id number]  [id number]  DOB - [birth year]  Admit date - 07/06/2012  Admitting Physician Hollice Espy, MD  Outpatient Primary MD for the patient is No primary provider on file.  LOS - 2   Chief Complaint  Patient presents with  . Dehydration        Assessment & Plan    1. Early DTs in a patient who drinks heavy amounts of alcohol on a daily basis- much better on Po Librium, no IV Ativan needed, change to Po Vitamins, CIWA protocol.    2. Aspiration pneumonia - continue aspiration precautions, more alert awake, Po diet with assistance, will place him on IV Unasyn and azithromycin. Follow cultures.     3. Diabetes mellitus type 2- change to QAC-HS sliding scale.     4. Hyponatremia- secondary to heavy alcohol use, Ur Na >100, stop IVF, fluid restriction, gentle lasix x 1.     5. Severe bilateral conjunctivitis- patient will be placed on Cipro eyedrops continue.Imrpoved     6. One loose BM last night - apparently C. difficile PCR has been sent to rule out C. Difficile, no further loose stools since then, doubt C. difficile clinically we'll monitor.     7. Multiple skin abrasions continue wound care as before. Wound care following.     Code Status: Full  Family Communication: Fair  Disposition Plan: Home    Procedures    Consults  None   Antibiotics  Unasyn and azithro  - 07-07-12   Time Spent in minutes  35   DVT Prophylaxis  SCDs     Susa Raring K M.D on 07/08/2012 at 7:35 AM  Between 7am to 7pm - Pager - [phone]  After 7pm go to [email or web address] - password [email or web address]  And look for the night coverage person covering for me after hours  [facility] Office  [phone]    Subjective:   Benjamin [name] today has, No headache, No chest pain, No abdominal  pain - No Nausea, No new weakness tingling or numbness, No Cough - SOB. 1 loose BM 07-07-12  Objective:   Filed Vitals:   07/07/12 2200 07/08/12 0000 07/08/12 0400 07/08/12 0600  BP: 132/70 144/67 140/72 143/68  Pulse: 82 72 67 74  Temp:  99.4 F (37.4 C) 98.2 F (36.8 C)   TempSrc:  Oral Oral   Resp: 17 18 17 16   Height:      Weight:  69.1 kg (152 lb 5.4 oz)    SpO2: 97% 96% 98% 97%    Wt Readings from Last 3 Encounters:  07/08/12 69.1 kg (152 lb 5.4 oz)     Intake/Output Summary (Last 24 hours) at 07/08/12 0735 Last data filed at 07/08/12 0600  Gross per 24 hour  Intake   1400 ml  Output   1850 ml  Net   -450 ml    Exam Awake Alert, Oriented X 3, No new F.N deficits, Normal affect [place].AT,PERRAL Supple Neck,No  JVD, No cervical lymphadenopathy appriciated.  Symmetrical Chest wall movement, Good air movement bilaterally, CTAB RRR,No Gallops,Rubs or new Murmurs, No Parasternal Heave +ve B.Sounds, Abd Soft, Non tender, No organomegaly appriciated, No rebound - guarding or rigidity. No Cyanosis, Clubbing or edema, No new Rash or bruise, multiple abrasions stable   Data Review   CBC  Lab 07/08/12 0345 07/06/12 2232 07/06/12 2215  WBC 3.4* -- 4.2  HGB 12.1* 15.3 14.7  HCT 33.9* 45.0 40.8  PLT 83* -- 138*  MCV 92.4 -- 90.5  MCH 32.8 -- 32.6  MCHC 35.5 -- 36.0  RDW 12.8 -- 12.7  LYMPHSABS -- -- 0.8  MONOABS -- -- 0.3  EOSABS -- -- 0.0  BASOSABS -- -- 0.0  BANDABS -- -- --    Chemistries   Lab 07/08/12 0345 07/07/12 1510 07/06/12 2232  NA 128* -- 130*  K 3.4* -- 4.6  CL 91* -- 94*  CO2 21 -- --  GLUCOSE 115* -- 154*  BUN 9 -- 11  CREATININE 0.47* -- 1.20  CALCIUM 7.7* -- --  MG -- 1.7 --  AST -- -- --  ALT -- -- --  ALKPHOS -- -- --  BILITOT -- -- --   ------------------------------------------------------------------------------------------------------------------ estimated creatinine clearance is 97.2 ml/min (by C-G formula based on Cr of  0.47). ------------------------------------------------------------------------------------------------------------------  [facility] 07/07/12 1510  HGBA1C 8.4*   ------------------------------------------------------------------------------------------------------------------ No results found for this basename: CHOL:2,HDL:2,LDLCALC:2,TRIG:2,CHOLHDL:2,LDLDIRECT:2 in the last 72 hours ------------------------------------------------------------------------------------------------------------------  Basename 07/07/12 1510  TSH 2.164  T4TOTAL --  T3FREE --  THYROIDAB --   ------------------------------------------------------------------------------------------------------------------ No results found for this basename: VITAMINB12:2,FOLATE:2,FERRITIN:2,TIBC:2,IRON:2,RETICCTPCT:2 in the last 72 hours  Coagulation profile  Lab 07/08/12 0345  INR 0.96  PROTIME --    No results found for this basename: DDIMER:2 in the last 72 hours  Cardiac Enzymes No results found for this basename: CK:3,CKMB:3,TROPONINI:3,MYOGLOBIN:3 in the last 168 hours ------------------------------------------------------------------------------------------------------------------ No components found with this basename: POCBNP:3  Micro Results Recent Results (from the past 240 hour(s))  MRSA PCR SCREENING     Status: Normal   Collection Time   07/07/12  2:19 PM      Component Value Range Status Comment   MRSA by PCR NEGATIVE  NEGATIVE Final     Radiology Reports No results found.  Scheduled Meds:   . amoxicillin-clavulanate  1 tablet Oral Once  . ampicillin-sulbactam (UNASYN) IV  3 g Intravenous Q6H  . azithromycin  500 mg Intravenous Once  . azithromycin  500 mg Intravenous Q24H  . chlordiazePOXIDE  10 mg Oral QID  . ciprofloxacin  2 drop Both Eyes Q2H  . ciprofloxacin  2 drop Both Eyes Q4H  . dextrose      . insulin aspart  0-9 Units Subcutaneous Q4H  . LORazepam  0-4 mg Intravenous Q6H    Followed by  . LORazepam  0-4 mg Intravenous Q12H  . LORazepam  1 mg Intravenous Once  . LORazepam  1 mg Oral Once  . LORazepam  1 mg Oral Once  . magnesium sulfate 1 - 4 g bolus IVPB  1 g Intravenous Once  . multivitamin with minerals  1 tablet Oral Daily  . pneumococcal 23 valent vaccine  0.5 mL Intramuscular Tomorrow-1000  . banana bag IV fluid 1000 mL   Intravenous Q24H  . sodium chloride  3 mL Intravenous Q12H  . TDaP  0.5 mL Intramuscular Once  . DISCONTD: sodium chloride   Intravenous STAT  . DISCONTD: sodium chloride   Intravenous Q24H  .  DISCONTD: ciprofloxacin  2 drop Both Eyes Q2H  . DISCONTD: folic acid  1 mg Oral Daily  . DISCONTD: thiamine  100 mg Intravenous Daily  . DISCONTD: thiamine  100 mg Oral Daily   Continuous Infusions:   . DISCONTD: banana bag IV fluid 1000 mL 100 mL/hr at 07/07/12 1500  . DISCONTD: banana bag IV fluid 1000 mL     PRN Meds:.albuterol, guaiFENesin-dextromethorphan, HYDROcodone-acetaminophen, LORazepam, LORazepam, ondansetron (ZOFRAN) IV, ondansetron

## 2012-07-08 NOTE — Progress Notes (Addendum)
Benjamin [name] 1240 TRANSFERRED TO 1431- NO CHANGE IN CONDITION- FOR TELE.- ON RA. REPORT CALLED TO TESS RN. C-DIFF NEG.- ? PINK EYE- ON CONTACT.

## 2012-07-09 LAB — BASIC METABOLIC PANEL
BUN: 10 mg/dL (ref 6–23)
GFR calc non Af Amer: >90 mL/min (ref >90)
Glucose, Bld: 209 mg/dL — ABNORMAL HIGH (ref 70–99)
Potassium: 3.1 mEq/L — ABNORMAL LOW (ref 3.5–5.1)

## 2012-07-09 LAB — GLUCOSE, CAPILLARY
Glucose-Capillary: 179 mg/dL — ABNORMAL HIGH (ref 70–99)
Glucose-Capillary: 285 mg/dL — ABNORMAL HIGH (ref 70–99)

## 2012-07-09 LAB — OSMOLALITY: Osmolality: 277 mOsm/kg (ref 275–300)

## 2012-07-09 LAB — SODIUM, URINE, RANDOM: Sodium, Ur: 106 mEq/L

## 2012-07-09 MED ORDER — AMOXICILLIN-POT CLAVULANATE 875-125 MG PO TABS
1.0000 | ORAL_TABLET | Freq: Two times a day (BID) | ORAL | Status: DC
  Administered 2012-07-09 – 2012-07-11 (×5): 1 via ORAL
  Filled 2012-07-09 (×6): qty 1

## 2012-07-09 MED ORDER — GATORADE (BH): 240.0000 mL | Freq: Four times a day (QID) | ORAL | Status: DC

## 2012-07-09 MED ORDER — FUROSEMIDE 10 MG/ML IJ SOLN
20.0000 mg | Freq: Once | INTRAMUSCULAR | Status: AC
  Administered 2012-07-09: 20 mg via INTRAVENOUS
  Filled 2012-07-09: qty 2

## 2012-07-09 MED ORDER — CLONIDINE HCL 0.1 MG PO TABS
0.1000 mg | ORAL_TABLET | Freq: Three times a day (TID) | ORAL | Status: DC
  Administered 2012-07-09: 1 mg via ORAL
  Administered 2012-07-09 – 2012-07-11 (×4): 0.1 mg via ORAL
  Filled 2012-07-09 (×8): qty 1

## 2012-07-09 MED ORDER — AZITHROMYCIN 250 MG PO TABS
250.0000 mg | ORAL_TABLET | Freq: Every day | ORAL | Status: DC
  Administered 2012-07-09 – 2012-07-11 (×3): 250 mg via ORAL
  Filled 2012-07-09 (×3): qty 1

## 2012-07-09 MED ORDER — MAGNESIUM SULFATE IN D5W 10-5 MG/ML-% IV SOLN
1.0000 g | Freq: Once | INTRAVENOUS | Status: AC
  Administered 2012-07-09: 1 g via INTRAVENOUS
  Filled 2012-07-09: qty 100

## 2012-07-09 MED ORDER — POTASSIUM CHLORIDE CRYS ER 20 MEQ PO TBCR
40.0000 meq | EXTENDED_RELEASE_TABLET | Freq: Two times a day (BID) | ORAL | Status: AC
  Administered 2012-07-09 (×2): 40 meq via ORAL
  Filled 2012-07-09 (×2): qty 2

## 2012-07-09 MED ORDER — MAGNESIUM SULFATE 50 % IJ SOLN: 1.0000 g | Freq: Once | INTRAMUSCULAR | Status: DC

## 2012-07-09 NOTE — Progress Notes (Signed)
[facility]                                                                                Patient Demographics  Benjamin [name], is a 61 y.o. male  ZHY:[id number]  [id number]:[id number]  DOB - [birth year]  Admit date - 07/06/2012  Admitting Physician Hollice Espy, MD  Outpatient Primary MD for the patient is No primary provider on file.  LOS - 3   Chief Complaint  Patient presents with  . Dehydration        Assessment & Plan    1. Early DTs in a patient who drinks heavy amounts of alcohol on a daily basis- much better on Po Librium, no IV Ativan needed, change to Po Vitamins, CIWA protocol. Much stable DT seem to be in good control.     2. Aspiration pneumonia - continue aspiration precautions, more alert awake, Po diet with assistance, will place him on IV Unasyn and azithromycin. Follow cultures.     3. Diabetes mellitus type 2- change to QAC-HS sliding scale.  CBG (last 3)   Basename 07/09/12 0805 07/08/12 2041 07/08/12 1644  GLUCAP 179* 227* 257*    Lab Results  Component Value Date   HGBA1C 8.4* 07/07/2012        4. 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    5. Severe bilateral conjunctivitis- patient will be placed on Cipro eyedrops continue, much improved now.     6. One loose BM last night - resolved, C. difficile negative.    7. Multiple skin abrasions continue wound care as before. Wound care following.     Code Status: Full  Family Communication: Fair  Disposition Plan: Home    Procedures    Consults  None   Antibiotics  Unasyn and azithro  - 07-07-12   Time Spent in minutes  35   DVT Prophylaxis  SCDs     Susa Raring K M.D on 07/09/2012 at 11:18  AM  Between 7am to 7pm - Pager - [phone]  After 7pm go to [email or web address] - password [email or web address]  And look for the night coverage person covering for me after hours  [facility] Office  [phone]    Subjective:   Benjamin [name] today has, No headache, No chest pain, No abdominal pain - No Nausea, No new weakness tingling or numbness, No Cough - SOB. 1 loose BM 07-07-12  Objective:   Filed Vitals:   07/08/12 1200 07/08/12 1259 07/08/12 2204 07/09/12 0529  BP:  160/84 161/81 151/79  Pulse:  95 77 62  Temp: 98.4 F (36.9 C) 98.2 F (36.8 C) 98.7 F (37.1 C) 98.4 F (36.9 C)  TempSrc: Oral Oral Oral Oral  Resp:  20 18 20   Height:      Weight:    63.6 kg (140 lb 3.4 oz)  SpO2:  96% 97% 98%    Wt Readings from Last 3 Encounters:  07/09/12 63.6 kg (140 lb 3.4 oz)     Intake/Output Summary (Last 24 hours) at 07/09/12 1118 Last data filed at 07/09/12 1100  Gross per 24 hour  Intake   1460 ml  Output   4101 ml  Net  -2641 ml    Exam Awake Alert, Oriented X 3, No new F.N deficits, Normal affect [place].AT,PERRAL Supple Neck,No JVD, No cervical lymphadenopathy appriciated.  Symmetrical Chest wall movement, Good air movement bilaterally, CTAB RRR,No Gallops,Rubs or new Murmurs, No Parasternal Heave +ve B.Sounds, Abd Soft, Non tender, No organomegaly appriciated, No rebound - guarding or rigidity. No Cyanosis, Clubbing or edema, No new Rash or bruise, multiple abrasions stable   Data Review   CBC  Lab 07/08/12 0345 07/06/12 2232 07/06/12 2215  WBC 3.4* -- 4.2  HGB 12.1* 15.3 14.7  HCT 33.9* 45.0 40.8  PLT 83* -- 138*  MCV 92.4 -- 90.5  MCH 32.8 -- 32.6  MCHC 35.5 -- 36.0  RDW 12.8 -- 12.7  LYMPHSABS -- -- 0.8  MONOABS -- -- 0.3  EOSABS -- -- 0.0  BASOSABS -- -- 0.0  BANDABS -- -- --    Chemistries   Lab 07/09/12 0428 07/08/12 0345 07/07/12 1510 07/06/12 2232  NA 125* 128* -- 130*  K 3.1* 3.4* -- 4.6  CL 87* 91* -- 94*  CO2 25 21 -- --  GLUCOSE  209* 115* -- 154*  BUN 10 9 -- 11  CREATININE 0.45* 0.47* -- 1.20  CALCIUM 8.5 7.7* -- --  MG 1.5 -- 1.7 --  AST -- -- -- --  ALT -- -- -- --  ALKPHOS -- -- -- --  BILITOT -- -- -- --   ------------------------------------------------------------------------------------------------------------------ estimated creatinine clearance is 89.4 ml/min (by C-G formula based on Cr of 0.45). ------------------------------------------------------------------------------------------------------------------  [facility] 07/07/12 1510  HGBA1C 8.4*   ------------------------------------------------------------------------------------------------------------------ No results found for this basename: CHOL:2,HDL:2,LDLCALC:2,TRIG:2,CHOLHDL:2,LDLDIRECT:2 in the last 72 hours ------------------------------------------------------------------------------------------------------------------  Basename 07/07/12 1510  TSH 2.164  T4TOTAL --  T3FREE --  THYROIDAB --   ------------------------------------------------------------------------------------------------------------------ No results found for this basename: VITAMINB12:2,FOLATE:2,FERRITIN:2,TIBC:2,IRON:2,RETICCTPCT:2 in the last 72 hours  Coagulation profile  Lab 07/08/12 0345  INR 0.96  PROTIME --    No results found for this basename: DDIMER:2 in the last 72 hours  Cardiac Enzymes No results found for this basename: CK:3,CKMB:3,TROPONINI:3,MYOGLOBIN:3 in the last 168 hours ------------------------------------------------------------------------------------------------------------------ No components found with this basename: POCBNP:3  Micro Results Recent Results (from the past 240 hour(s))  MRSA PCR SCREENING     Status: Normal   Collection Time   07/07/12  2:19 PM      Component Value Range Status Comment   MRSA by PCR NEGATIVE  NEGATIVE Final   CULTURE, BLOOD (ROUTINE X 2)     Status: Normal (Preliminary result)   Collection Time    07/07/12  3:10 PM      Component Value Range Status Comment   Specimen Description BLOOD LEFT ARM   Final    Special Requests BOTTLES DRAWN AEROBIC AND ANAEROBIC    Final    Culture  Setup Time 07/07/2012 20:45   Final    Culture     Final    Value:        BLOOD CULTURE RECEIVED NO GROWTH TO DATE CULTURE WILL BE HELD FOR 5 DAYS BEFORE ISSUING A FINAL NEGATIVE REPORT   Report Status PENDING   Incomplete   CULTURE, BLOOD (ROUTINE X 2)     Status: Normal (Preliminary result)  Collection Time   07/07/12  3:14 PM      Component Value Range Status Comment   Specimen Description BLOOD LEFT ARM   Final    Special Requests BOTTLES DRAWN AEROBIC ONLY    Final    Culture  Setup Time 07/07/2012 20:47   Final    Culture     Final    Value:        BLOOD CULTURE RECEIVED NO GROWTH TO DATE CULTURE WILL BE HELD FOR 5 DAYS BEFORE ISSUING A FINAL NEGATIVE REPORT   Report Status PENDING   Incomplete   CLOSTRIDIUM DIFFICILE BY PCR     Status: Normal   Collection Time   07/07/12  5:12 PM      Component Value Range Status Comment   C difficile by pcr NEGATIVE  NEGATIVE Final     Radiology Reports No results found.  Scheduled Meds:    . amoxicillin-clavulanate  1 tablet Oral BID  . azithromycin  250 mg Oral Daily  . chlordiazePOXIDE  10 mg Oral QID  . ciprofloxacin  2 drop Both Eyes Q2H  . ciprofloxacin  2 drop Both Eyes Q4H  . cloNIDine  0.2 mg Oral TID  . folic acid  1 mg Oral Daily  . insulin aspart  0-9 Units Subcutaneous TID WC  . LORazepam  0-4 mg Intravenous Q6H   Followed by  . LORazepam  0-4 mg Intravenous Q12H  . magnesium sulfate  1 g Intravenous Once  . magnesium sulfate 1 - 4 g bolus IVPB  1 g Intravenous Once  . multivitamin with minerals  1 tablet Oral Daily  . potassium chloride  40 mEq Oral BID  . sodium chloride  3 mL Intravenous Q12H  . thiamine  100 mg Oral Daily  . DISCONTD: ampicillin-sulbactam (UNASYN) IV  3 g Intravenous Q6H  . DISCONTD: azithromycin  500 mg  Intravenous Q24H   Continuous Infusions:  PRN Meds:.albuterol, guaiFENesin-dextromethorphan, HYDROcodone-acetaminophen, LORazepam, LORazepam, ondansetron (ZOFRAN) IV, ondansetron

## 2012-07-10 LAB — GLUCOSE, CAPILLARY: Glucose-Capillary: 380 mg/dL — ABNORMAL HIGH (ref 70–99)

## 2012-07-10 LAB — BASIC METABOLIC PANEL
BUN: 14 mg/dL (ref 6–23)
Chloride: 91 mEq/L — ABNORMAL LOW (ref 96–112)
Creatinine, Ser: 0.63 mg/dL (ref 0.50–1.35)
GFR calc non Af Amer: >90 mL/min (ref >90)
Glucose, Bld: 258 mg/dL — ABNORMAL HIGH (ref 70–99)
Potassium: 4.5 mEq/L (ref 3.5–5.1)

## 2012-07-10 MED ORDER — CHLORDIAZEPOXIDE HCL 5 MG PO CAPS
10.0000 mg | ORAL_CAPSULE | Freq: Two times a day (BID) | ORAL | Status: DC
  Administered 2012-07-10 – 2012-07-11 (×3): 10 mg via ORAL
  Filled 2012-07-10: qty 1
  Filled 2012-07-10: qty 2
  Filled 2012-07-10 (×2): qty 1
  Filled 2012-07-10: qty 2

## 2012-07-10 MED ORDER — INSULIN GLARGINE 100 UNIT/ML ~~LOC~~ SOLN
8.0000 [IU] | Freq: Every day | SUBCUTANEOUS | Status: DC
  Administered 2012-07-10 – 2012-07-11 (×2): 8 [IU] via SUBCUTANEOUS

## 2012-07-10 MED ORDER — FUROSEMIDE 10 MG/ML IJ SOLN
20.0000 mg | Freq: Once | INTRAMUSCULAR | Status: AC
  Administered 2012-07-10: 20 mg via INTRAVENOUS
  Filled 2012-07-10: qty 2

## 2012-07-10 NOTE — Progress Notes (Signed)
   CARE MANAGEMENT NOTE 07/10/2012  Patient:  Benjamin [name], Benjamin [name]   Account Number:  [id number]  Date Initiated:  07/10/2012  Documentation initiated by:  Jiles Crocker  Subjective/Objective Assessment:   ADMITTED WITH EARLY DT, Aspiration pneumonia     Action/Plan:   INDEPENDENT PRIOR TO ADMISSION, CONTINUES TO WORK, HAS PRIVATE INSURANCE; SOC WORK REFERRAL FOR ETOH ABUSE   Anticipated DC Date:  07/13/2012   Anticipated DC Plan:  HOME/SELF CARE  In-house referral  Clinical Social Worker      DC Planning Services  CM consult               Status of service:  In process, will continue to follow Medicare Important Message given?  NA - LOS <3 / Initial given by admissions (If response is "NO", the following Medicare IM given date fields will be blank)  Per UR Regulation:  Reviewed for med. necessity/level of care/duration of stay  Comments:  07/10/2012- B Pharrah Rottman RN, BSN, MHA

## 2012-07-10 NOTE — Progress Notes (Signed)
[facility]                                                                                Patient Demographics  Benjamin [name], is a 61 y.o. male  [id number]:[id number]  QVZ:[id number]  DOB - 1951-07-17  Admit date - 07/06/2012  Admitting Physician Hollice Espy, MD  Outpatient Primary MD for the patient is No primary provider on file.  LOS - 4   Chief Complaint  Patient presents with  . Dehydration        Assessment & Plan    1. Early DTs in a patient who drinks heavy amounts of alcohol on a daily basis- much better on Po Librium, changed to Po Vitamins, monitor per CIWA protocol. DTs seemed to have resolved, Reduce Librium does DC IV Ativan.     2. Aspiration pneumonia - continue aspiration precautions, more alert awake, Po diet with assistance, will place him on IV Unasyn and azithromycin. Follow cultures. Clinically much better.     3. Diabetes mellitus type 2- change to QAC-HS sliding scale. Added low-dose Lantus for better control.  CBG (last 3)   Basename 07/10/12 0724 07/09/12 2208 07/09/12 1659  GLUCAP 242* 285* 243*    Lab Results  Component Value Date   HGBA1C 8.4* 07/07/2012        4. 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 Sodium is improving.     5. Severe bilateral conjunctivitis- patient will be placed on Cipro eyedrops continue, much improved now.     6. One loose BM - resolved, C. difficile negative.    7. Multiple skin abrasions continue wound care as before. Wound care following.     Code Status: Full  Family Communication: Fair  Disposition Plan: Home    Procedures    Consults  None   Antibiotics Augmentin and azithromycin from  07-07-2012   Time Spent in minutes  35   DVT Prophylaxis  SCDs     Leroy Sea M.D on 07/10/2012 at 10:45 AM  Between 7am to 7pm - Pager - [phone]  After 7pm go to [email or web address] - password [email or web address]  And look for the night coverage person covering for me after hours  [facility] Office  [phone]    Subjective:   Benjamin [name] today has, No headache, No chest pain, No abdominal pain - No Nausea, No new weakness tingling or numbness, No Cough - SOB.   Objective:   Filed Vitals:   07/09/12 0529 07/09/12 1345 07/09/12 2210 07/10/12 0610  BP: 151/79 106/63 149/78 147/82  Pulse: 62 78 67 63  Temp: 98.4 F (36.9 C) 98.4 F (36.9 C) 98.3 F (36.8 C) 98.3 F (36.8 C)  TempSrc: Oral Oral Oral Oral  Resp: 20 16 16 16   Height:      Weight: 63.6 kg (140 lb 3.4 oz)   63.5 kg (139 lb 15.9 oz)  SpO2: 98% 98%  97% 98%    Wt Readings from Last 3 Encounters:  07/10/12 63.5 kg (139 lb 15.9 oz)     Intake/Output Summary (Last 24 hours) at 07/10/12 1045 Last data filed at 07/10/12 0900  Gross per 24 hour  Intake   1720 ml  Output   2651 ml  Net   -931 ml    Exam Awake Alert, Oriented X 3, No new F.N deficits, Normal affect [place].AT,PERRAL Supple Neck,No JVD, No cervical lymphadenopathy appriciated.  Symmetrical Chest wall movement, Good air movement bilaterally, CTAB RRR,No Gallops,Rubs or new Murmurs, No Parasternal Heave +ve B.Sounds, Abd Soft, Non tender, No organomegaly appriciated, No rebound - guarding or rigidity. No Cyanosis, Clubbing or edema, No new Rash or bruise, multiple abrasions stable   Data Review   CBC  Lab 07/08/12 0345 07/06/12 2232 07/06/12 2215  WBC 3.4* -- 4.2  HGB 12.1* 15.3 14.7  HCT 33.9* 45.0 40.8  PLT 83* -- 138*  MCV 92.4 -- 90.5  MCH 32.8 -- 32.6  MCHC 35.5 -- 36.0  RDW 12.8 -- 12.7  LYMPHSABS -- -- 0.8  MONOABS -- -- 0.3  EOSABS -- -- 0.0  BASOSABS -- -- 0.0  BANDABS -- -- --    Chemistries   Lab 07/10/12 0425  07/09/12 0428 07/08/12 0345 07/07/12 1510 07/06/12 2232  NA 128* 125* 128* -- 130*  K 4.5 3.1* 3.4* -- 4.6  CL 91* 87* 91* -- 94*  CO2 28 25 21  -- --  GLUCOSE 258* 209* 115* -- 154*  BUN 14 10 9  -- 11  CREATININE 0.63 0.45* 0.47* -- 1.20  CALCIUM 8.9 8.5 7.7* -- --  MG -- 1.5 -- 1.7 --  AST -- -- -- -- --  ALT -- -- -- -- --  ALKPHOS -- -- -- -- --  BILITOT -- -- -- -- --   ------------------------------------------------------------------------------------------------------------------ estimated creatinine clearance is 89.3 ml/min (by C-G formula based on Cr of 0.63). ------------------------------------------------------------------------------------------------------------------  [facility] 07/07/12 1510  HGBA1C 8.4*   ------------------------------------------------------------------------------------------------------------------ No results found for this basename: CHOL:2,HDL:2,LDLCALC:2,TRIG:2,CHOLHDL:2,LDLDIRECT:2 in the last 72 hours ------------------------------------------------------------------------------------------------------------------  Basename 07/07/12 1510  TSH 2.164  T4TOTAL --  T3FREE --  THYROIDAB --   ------------------------------------------------------------------------------------------------------------------ No results found for this basename: VITAMINB12:2,FOLATE:2,FERRITIN:2,TIBC:2,IRON:2,RETICCTPCT:2 in the last 72 hours  Coagulation profile  Lab 07/08/12 0345  INR 0.96  PROTIME --    No results found for this basename: DDIMER:2 in the last 72 hours  Cardiac Enzymes No results found for this basename: CK:3,CKMB:3,TROPONINI:3,MYOGLOBIN:3 in the last 168 hours ------------------------------------------------------------------------------------------------------------------ No components found with this basename: POCBNP:3  Micro Results Recent Results (from the past 240 hour(s))  MRSA PCR SCREENING     Status: Normal   Collection Time    07/07/12  2:19 PM      Component Value Range Status Comment   MRSA by PCR NEGATIVE  NEGATIVE Final   CULTURE, BLOOD (ROUTINE X 2)     Status: Normal (Preliminary result)   Collection Time   07/07/12  3:10 PM      Component Value Range Status Comment   Specimen Description BLOOD LEFT ARM   Final    Special Requests BOTTLES DRAWN AEROBIC AND ANAEROBIC    Final    Culture  Setup Time 07/07/2012 20:45   Final    Culture     Final    Value:        BLOOD CULTURE RECEIVED NO GROWTH TO DATE CULTURE WILL BE HELD FOR 5 DAYS BEFORE ISSUING A  FINAL NEGATIVE REPORT   Report Status PENDING   Incomplete   CULTURE, BLOOD (ROUTINE X 2)     Status: Normal (Preliminary result)   Collection Time   07/07/12  3:14 PM      Component Value Range Status Comment   Specimen Description BLOOD LEFT ARM   Final    Special Requests BOTTLES DRAWN AEROBIC ONLY    Final    Culture  Setup Time 07/07/2012 20:47   Final    Culture     Final    Value:        BLOOD CULTURE RECEIVED NO GROWTH TO DATE CULTURE WILL BE HELD FOR 5 DAYS BEFORE ISSUING A FINAL NEGATIVE REPORT   Report Status PENDING   Incomplete   CLOSTRIDIUM DIFFICILE BY PCR     Status: Normal   Collection Time   07/07/12  5:12 PM      Component Value Range Status Comment   C difficile by pcr NEGATIVE  NEGATIVE Final     Radiology Reports No results found.  Scheduled Meds:    . amoxicillin-clavulanate  1 tablet Oral BID  . azithromycin  250 mg Oral Daily  . chlordiazePOXIDE  10 mg Oral BID  . ciprofloxacin  2 drop Both Eyes Q2H  . ciprofloxacin  2 drop Both Eyes Q4H  . cloNIDine  0.1 mg Oral TID  . folic acid  1 mg Oral Daily  . furosemide  20 mg Intravenous Once  . furosemide  20 mg Intravenous Once  . insulin aspart  0-9 Units Subcutaneous TID WC  . insulin glargine  8 Units Subcutaneous Daily  . LORazepam  0-4 mg Intravenous Q6H  . magnesium sulfate 1 - 4 g bolus IVPB  1 g Intravenous Once  . multivitamin with minerals  1 tablet Oral Daily   . potassium chloride  40 mEq Oral BID  . sodium chloride  3 mL Intravenous Q12H  . thiamine  100 mg Oral Daily  . DISCONTD: chlordiazePOXIDE  10 mg Oral QID  . DISCONTD: cloNIDine  0.2 mg Oral TID  . DISCONTD: gatorade (BH)  240 mL Oral QID  . DISCONTD: LORazepam  0-4 mg Intravenous Q12H  . DISCONTD: magnesium sulfate  1 g Intravenous Once   Continuous Infusions:  PRN Meds:.albuterol, guaiFENesin-dextromethorphan, HYDROcodone-acetaminophen, LORazepam, ondansetron (ZOFRAN) IV, ondansetron, DISCONTD: LORazepam

## 2012-07-11 LAB — BASIC METABOLIC PANEL
BUN: 16 mg/dL (ref 6–23)
CO2: 27 mEq/L (ref 19–32)
Chloride: 94 mEq/L — ABNORMAL LOW (ref 96–112)
Creatinine, Ser: 0.58 mg/dL (ref 0.50–1.35)
Glucose, Bld: 183 mg/dL — ABNORMAL HIGH (ref 70–99)

## 2012-07-11 LAB — GLUCOSE, CAPILLARY: Glucose-Capillary: 265 mg/dL — ABNORMAL HIGH (ref 70–99)

## 2012-07-11 MED ORDER — ADULT MULTIVITAMIN W/MINERALS CH: 1.0000 | ORAL_TABLET | Freq: Every day | ORAL | Status: AC

## 2012-07-11 MED ORDER — FOLIC ACID 1 MG PO TABS: 1.0000 mg | ORAL_TABLET | Freq: Every day | ORAL | Status: DC

## 2012-07-11 MED ORDER — CIPROFLOXACIN HCL 0.3 % OP SOLN: 2.0000 [drp] | OPHTHALMIC | Status: AC

## 2012-07-11 MED ORDER — CLONIDINE HCL 0.1 MG PO TABS: 0.1000 mg | ORAL_TABLET | Freq: Three times a day (TID) | ORAL | Status: DC

## 2012-07-11 MED ORDER — THIAMINE HCL 100 MG PO TABS: 100.0000 mg | ORAL_TABLET | Freq: Every day | ORAL | Status: DC

## 2012-07-11 MED ORDER — FUROSEMIDE 10 MG/ML IJ SOLN
20.0000 mg | Freq: Once | INTRAMUSCULAR | Status: AC
  Administered 2012-07-11: 20 mg via INTRAVENOUS
  Filled 2012-07-11: qty 2

## 2012-07-11 MED ORDER — AZITHROMYCIN 250 MG PO TABS: 250.0000 mg | ORAL_TABLET | Freq: Every day | ORAL | Status: DC

## 2012-07-11 MED ORDER — CHLORDIAZEPOXIDE HCL 5 MG PO CAPS: ORAL_CAPSULE | ORAL | Status: DC

## 2012-07-11 MED ORDER — AMOXICILLIN-POT CLAVULANATE 500-125 MG PO TABS: 1.0000 | ORAL_TABLET | Freq: Three times a day (TID) | ORAL | Status: AC

## 2012-07-11 NOTE — Evaluation (Signed)
Physical Therapy Evaluation Patient Details Name: Benjamin [name] MRN: [id number] DOB: 01-17-1951 Today's Date: 07/11/2012 Time: 4098-1191 PT Time Calculation (min): 11 min  PT Assessment / Plan / Recommendation Clinical Impression  Pt admitted for ETOH abuse and hyponatremia.  Pt doing well with mobility however does present with shaky UEs and LEs but steady with RW during ambulation.  Pt would benefit from acute PT services to maximize independence with ambulation safely prior to d/c home alone.  Pt will likely progress quickly.    PT Assessment  Patient needs continued PT services    Follow Up Recommendations  No PT follow up    Barriers to Discharge        Equipment Recommendations  None recommended by PT    Recommendations for Other Services     Frequency Min 3X/week    Precautions / Restrictions Precautions Precautions: Fall   Pertinent Vitals/Pain No pain      Mobility  Bed Mobility Bed Mobility: Supine to Sit;Sit to Supine Supine to Sit: 6: Modified independent (Device/Increase time) Sit to Supine: 6: Modified independent (Device/Increase time) Transfers Transfers: Stand to Sit;Sit to Stand Sit to Stand: 5: Supervision;From bed Stand to Sit: 5: Supervision;To bed Details for Transfer Assistance: used good technique, shaky UEs Ambulation/Gait Ambulation/Gait Assistance: 5: Supervision Ambulation Distance (Feet): 400 Feet Assistive device: Rolling walker Ambulation/Gait Assistance Details: pt with shaky UEs and LEs so used RW to steady, no unsteadiness observed with RW and pt agreeable to use RW at home Gait Pattern: Step-through pattern Gait velocity: decreased    Exercises     PT Diagnosis: Difficulty walking  PT Problem List: Decreased activity tolerance;Decreased mobility;Decreased knowledge of use of DME PT Treatment Interventions: DME instruction;Gait training;Therapeutic activities;Therapeutic exercise;Stair training;Patient/family education;Functional  mobility training;Balance training   PT Goals Acute Rehab PT Goals PT Goal Formulation: With patient Time For Goal Achievement: 07/18/12 Potential to Achieve Goals: Good Pt will go Sit to Stand: with modified independence PT Goal: Sit to Stand - Progress: Goal set today Pt will go Stand to Sit: with modified independence PT Goal: Stand to Sit - Progress: Goal set today Pt will Ambulate: >150 feet;with modified independence;with least restrictive assistive device PT Goal: Ambulate - Progress: Goal set today Pt will Perform Home Exercise Program: with supervision, verbal cues required/provided PT Goal: Perform Home Exercise Program - Progress: Goal set today  Visit Information  Last PT Received On: 07/11/12 Assistance Needed: +1    Subjective Data  Subjective: I was up walking yesterday.   Prior Functioning  Home Living Lives With: Alone Available Help at Discharge: Family;Available PRN/intermittently (reports brother with be in/out to check on him) Type of Home: House Home Access: Level entry Home Layout: One level Home Adaptive Equipment: Walker - rolling Prior Function Level of Independence: Independent Communication Communication: No difficulties    Cognition  Overall Cognitive Status: Appears within functional limits for tasks assessed/performed Arousal/Alertness: Awake/alert Orientation Level: Appears intact for tasks assessed Behavior During Session: Flat affect    Extremity/Trunk Assessment Right Upper Extremity Assessment RUE ROM/Strength/Tone: [facility] for tasks assessed Left Upper Extremity Assessment LUE ROM/Strength/Tone: WFL for tasks assessed Right Lower Extremity Assessment RLE ROM/Strength/Tone: [facility] for tasks assessed Left Lower Extremity Assessment LLE ROM/Strength/Tone: WFL for tasks assessed   Balance    End of Session PT - End of Session Equipment Utilized During Treatment: Gait belt Activity Tolerance: Patient tolerated treatment well Patient left:  in bed;with call bell/phone within reach  GP     [name],KATHrine E 07/11/2012,  12:32 PM Pager: [phone]

## 2012-07-11 NOTE — ED Provider Notes (Signed)
Medical screening examination/treatment/procedure(s) were performed by non-physician practitioner and as supervising physician I was immediately available for consultation/collaboration.  Fatih Stalvey, MD 07/11/12 1505 

## 2012-07-11 NOTE — ED Provider Notes (Signed)
Medical screening examination/treatment/procedure(s) were performed by non-physician practitioner and as supervising physician I was immediately available for consultation/collaboration.  Wetona Viramontes, MD 07/11/12 1502 

## 2012-07-11 NOTE — Discharge Summary (Addendum)
[facility]                                                                                   Benjamin [name], is a 61 y.o. male  DOB 1951-11-10  MRN [id number].  Admission date:  07/06/2012  Discharge Date:  07/11/2012  Primary MD  No primary provider on file.  Admitting Physician  Hollice Espy, MD  Admission Diagnosis  Alcohol abuse [305.00] Hyponatremia [276.1] Positive fecal occult blood test [792.1] Diabetes mellitus [250.00] Cat scratch of multiple sites [919.0, E906.8] dehydration;etoh  Discharge Diagnosis     Principal Problem:  *ETOH abuse Active Problems:  Hyponatremia  Diabetes mellitus       Past Medical History  Diagnosis Date  . Diabetes mellitus     History reviewed. No pertinent past surgical history.   Recommendations for primary care physician for things to follow:   These check patient's CBC BMP in a week along with 2 view chest x-ray.   Discharge Diagnoses:   Principal Problem:  *ETOH abuse Active Problems:  Hyponatremia  Diabetes mellitus    Discharge Condition: stable   Diet recommendation: See Discharge Instructions below   Consults PT, OT, case management   History of present illness and  Hospital Course:  See H&P, Labs, Consult and Test reports for all details in brief, patient was admitted for  early DTs along with aspiration pneumonia patient was treated with IV fluids along with initially IV Ativan and IV antibiotics, was seen by speech PT OT and case management, he was gradually transitioned to oral antibiotics around with oral Librium, he is now DT free has not required IV Ativan at all in the last 2 days, nice any cough fever shortness of breath. He does use walker at home which he has been advised to continue using.  Patient does have excessive water and fluid intake induced hyponatremia which is much improved with diet fluid restriction and gentle, sodium is up to 131 today he will get another dose of Lasix  and I have requested him to continue monitoring strict fluid restriction at home, case management has been requested to find patient primary care physician, patient has been requested to follow with her primary care physician in a week although he is extremely noncompliant have counseled him repeatedly to  follow with a physician on regular basis.    Patient did have some skin abrasions due to his cat scratching on a multiple time, abrasions or much better no signs of active cellulitis or infection.   He should also had bilateral conjunctivitis which is almost resolved with Cipro eye drops.   He was checked for Hemoccult stool in the ER, his stool was positive for Hemoccult blood, his H&H is stable and he did not have any evidence of frank bleeding or melena, I have requested him to follow with GI physician as an outpatient basis for evaluation of Hemoccult blood loss .   And has diabetes mellitus type 2 and I think he is very noncompliant and unreliable with acute checks, this time I'm discharging home on his home regimen of insulin with instructions to do Accu-Cheks q. a.c. at bedtime.  Lab Results  Component Value Date   HGBA1C 8.4* 07/07/2012    CBG (last 3)   Basename 07/11/12 0724 07/10/12 2216 07/10/12 1646  GLUCAP 172* 156* 243*        Today   Subjective:   Benjamin [name] today has no headache,no chest abdominal pain,no new weakness tingling or numbness, feels much better wants to go home today.    Objective:   Blood pressure 149/80, pulse 65, temperature 98.1 F (36.7 C), temperature source Oral, resp. rate 18, height 5\' 10"  (1.778 m), weight 62.2 kg (137 lb 2 oz), SpO2 97.00%.   Intake/Output Summary (Last 24 hours) at 07/11/12 1143 Last data filed at 07/11/12 0612  Gross per 24 hour  Intake    840 ml  Output   1550 ml  Net   -710 ml    Exam Awake Alert, Oriented *3, No new F.N deficits, Normal affect Logan.AT,PERRAL Supple Neck,No JVD, No cervical lymphadenopathy  appriciated.  Symmetrical Chest wall movement, Good air movement bilaterally, CTAB RRR,No Gallops,Rubs or new Murmurs, No Parasternal Heave +ve B.Sounds, Abd Soft, Non tender, No organomegaly appriciated, No rebound -guarding or rigidity. No Cyanosis, Clubbing or edema, No new Rash or bruise, much healed abrasions on his arms and legs  Data Review     Recent Results (from the past 240 hour(s))  MRSA PCR SCREENING     Status: Normal   Collection Time   07/07/12  2:19 PM      Component Value Range Status Comment   MRSA by PCR NEGATIVE  NEGATIVE Final   CULTURE, BLOOD (ROUTINE X 2)     Status: Normal (Preliminary result)   Collection Time   07/07/12  3:10 PM      Component Value Range Status Comment   Specimen Description BLOOD LEFT ARM   Final    Special Requests BOTTLES DRAWN AEROBIC AND ANAEROBIC    Final    Culture  Setup Time 07/07/2012 20:45   Final    Culture     Final    Value:        BLOOD CULTURE RECEIVED NO GROWTH TO DATE CULTURE WILL BE HELD FOR 5 DAYS BEFORE ISSUING A FINAL NEGATIVE REPORT   Report Status PENDING   Incomplete   CULTURE, BLOOD (ROUTINE X 2)     Status: Normal (Preliminary result)   Collection Time   07/07/12  3:14 PM      Component Value Range Status Comment   Specimen Description BLOOD LEFT ARM   Final    Special Requests BOTTLES DRAWN AEROBIC ONLY    Final    Culture  Setup Time 07/07/2012 20:47   Final    Culture     Final    Value:        BLOOD CULTURE RECEIVED NO GROWTH TO DATE CULTURE WILL BE HELD FOR 5 DAYS BEFORE ISSUING A FINAL NEGATIVE REPORT   Report Status PENDING   Incomplete   CLOSTRIDIUM DIFFICILE BY PCR     Status: Normal   Collection Time   07/07/12  5:12 PM      Component Value Range Status Comment   C difficile by pcr NEGATIVE  NEGATIVE Final      CBC w Diff: Lab Results  Component Value Date   WBC 3.4* 07/08/2012   HGB 12.1* 07/08/2012   HCT 33.9* 07/08/2012   PLT 83* 07/08/2012   LYMPHOPCT 20 07/06/2012   MONOPCT 7 07/06/2012    EOSPCT 1 07/06/2012  BASOPCT 1 07/06/2012    CMP: Lab Results  Component Value Date   NA 131* 07/11/2012   K 3.5 07/11/2012   CL 94* 07/11/2012   CO2 27 07/11/2012   BUN 16 07/11/2012   CREATININE 0.58 07/11/2012   PROT 7.9 01/17/2011   ALBUMIN 4.8 01/17/2011   BILITOT 0.8 01/17/2011   ALKPHOS 188* 01/17/2011   AST 50* 01/17/2011   ALT 35 01/17/2011  .   Discharge Instructions     Follow with Primary MD in 7 days   Get CBC, CMP, checked 7 days by Primary MD and again as instructed by your Primary MD. Get a 2 view Chest X ray done next visit.  Get Medicines reviewed and adjusted.   Accuchecks 4 times/day, Once in AM empty stomach and then before each meal. Log in all results and show them to your Prim.MD in 3 days. If any glucose reading is under 80 or above 300 call your Prim MD immidiately. Follow Low glucose instructions for glucose under 80 as instructed.   Please request your Prim.MD to go over all Hospital Tests and Procedure/Radiological results at the follow up, please get all Hospital records sent to your Prim MD by signing hospital release before you go home.  Activity: As tolerated with Full fall precautions use walker/cane & assistance as needed   Diet:  Heart healthy low carbohydrate diet with Aspiration precautions, to not drink more than 1.5 L of fluids total in a day.   Disposition Home    If you experience worsening of your admission symptoms, develop shortness of breath, life threatening emergency, suicidal or homicidal thoughts you must seek medical attention immediately by calling 911 or calling your MD immediately  if symptoms less severe.  You Must read complete instructions/literature along with all the possible adverse reactions/side effects for all the Medicines you take and that have been prescribed to you. Take any new Medicines after you have completely understood and accpet all the possible adverse reactions/side effects.   Do not drive if your were admitted  for syncope or siezures until you have seen by Primary MD or a Neurologist and advised to drive.  Do not drive when taking Pain medications.    Do not take more than prescribed Pain, Sleep and Anxiety Medications  Special Instructions: If you have smoked or chewed Tobacco  in the last 2 yrs please stop smoking, stop any regular Alcohol  and or any Recreational drug use.  Wear Seat belts while driving.  Follow-up Information    Follow up with Rob Bunting, MD. Call in 1 week. (Follow for trace blood in her stool)    Contact information:   [address] [place] [address] [phone]       Follow up with Your primary care provider has told by case manager. Schedule an appointment as soon as possible for a visit in 1 week.           Discharge Medications   Medication List  As of 07/11/2012 11:43 AM   START taking these medications         amoxicillin-clavulanate 500-125 MG per tablet   Commonly known as: AUGMENTIN   Take 1 tablet (500 mg total) by mouth every 8 (eight) hours.      azithromycin 250 MG tablet   Commonly known as: ZITHROMAX   Take 1 tablet (250 mg total) by mouth daily.      chlordiazePOXIDE 5 MG capsule  Commonly known as: LIBRIUM   Please dispense as written- take 2 pills for 3 days, then take one pill for 3 days then stop.      ciprofloxacin 0.3 % ophthalmic solution   Commonly known as: CILOXAN   Place 2 drops into both eyes every 4 (four) hours. Administer 1 drop, every 2 hours, while awake, for 2 days. Then 1 drop, every 4 hours, while awake, for the next 5 days.      cloNIDine 0.1 MG tablet   Commonly known as: CATAPRES   Take 1 tablet (0.1 mg total) by mouth 3 (three) times daily.      folic acid 1 MG tablet   Commonly known as: FOLVITE   Take 1 tablet (1 mg total) by mouth daily.      multivitamin with minerals Tabs   Take 1 tablet by mouth daily.      thiamine 100 MG tablet   Take 1 tablet (100 mg  total) by mouth daily.         CONTINUE taking these medications         insulin NPH 100 UNIT/ML injection   Commonly known as: HUMULIN N,NOVOLIN N      insulin regular 100 units/mL injection   Commonly known as: NOVOLIN R,HUMULIN R          Where to get your medications    These are the prescriptions that you need to pick up. We sent them to a specific pharmacy, so you will need to go there to get them.   [id number] - [place], [place] - [address]    [address] [place] [address]    Phone: [phone]        amoxicillin-clavulanate 500-125 MG per tablet   azithromycin 250 MG tablet   cloNIDine 0.1 MG tablet   folic acid 1 MG tablet   multivitamin with minerals Tabs   thiamine 100 MG tablet         You may get these medications from any pharmacy.         chlordiazePOXIDE 5 MG capsule   ciprofloxacin 0.3 % ophthalmic solution               Total Time in preparing paper work, data evaluation and todays exam - 35 minutes  Leroy Sea M.D on 07/11/2012 at 11:43 AM  [facility] Office  [phone]

## 2012-07-11 NOTE — Progress Notes (Signed)
Talked to patient about finding a Primary Care Physician, patient was going to [facility] but stated that his physician is no longer there and does not remember his name; A list of [facility] MD given to the patient to decide which physician that he wants to follow up with. CM offered to provide assistance with making an apt for the patient but he declined and stated the he would call and make an apt with a physician. Encouraged patient to call today or in the am for follow up hospital care; B Tish Frederickson, BSN, [place]

## 2012-07-13 LAB — CULTURE, BLOOD (ROUTINE X 2): Culture: NO GROWTH

## 2012-09-07 NOTE — ED Provider Notes (Signed)
Medical screening examination/treatment/procedure(s) were performed by non-physician practitioner and as supervising physician I was immediately available for consultation/collaboration.  Diallo Ponder, MD 09/07/12 2240 

## 2013-06-17 ENCOUNTER — Encounter (HOSPITAL_COMMUNITY): Payer: Self-pay | Admitting: *Deleted

## 2013-06-17 ENCOUNTER — Inpatient Hospital Stay (HOSPITAL_COMMUNITY)
Admission: EM | Admit: 2013-06-17 | Discharge: 2013-06-21 | DRG: 177 | Disposition: A | Discharge status: Home / Self Care | Payer: BC Managed Care – PPO | Attending: Internal Medicine | Admitting: Internal Medicine

## 2013-06-17 DIAGNOSIS — K269 Duodenal ulcer, unspecified as acute or chronic, without hemorrhage or perforation: Principal | ICD-10-CM | POA: Diagnosis present

## 2013-06-17 DIAGNOSIS — T39095A Adverse effect of salicylates, initial encounter: Secondary | ICD-10-CM | POA: Diagnosis present

## 2013-06-17 DIAGNOSIS — Z794 Long term (current) use of insulin: Secondary | ICD-10-CM

## 2013-06-17 DIAGNOSIS — M545 Low back pain, unspecified: Secondary | ICD-10-CM | POA: Diagnosis present

## 2013-06-17 DIAGNOSIS — M549 Dorsalgia, unspecified: Secondary | ICD-10-CM

## 2013-06-17 DIAGNOSIS — K922 Gastrointestinal hemorrhage, unspecified: Secondary | ICD-10-CM | POA: Diagnosis present

## 2013-06-17 DIAGNOSIS — E871 Hypo-osmolality and hyponatremia: Secondary | ICD-10-CM | POA: Diagnosis present

## 2013-06-17 DIAGNOSIS — E876 Hypokalemia: Secondary | ICD-10-CM | POA: Diagnosis present

## 2013-06-17 DIAGNOSIS — K279 Peptic ulcer, site unspecified, unspecified as acute or chronic, without hemorrhage or perforation: Secondary | ICD-10-CM | POA: Diagnosis present

## 2013-06-17 DIAGNOSIS — IMO0002 Reserved for concepts with insufficient information to code with codable children: Secondary | ICD-10-CM | POA: Diagnosis present

## 2013-06-17 DIAGNOSIS — F1010 Alcohol abuse, uncomplicated: Secondary | ICD-10-CM | POA: Diagnosis present

## 2013-06-17 DIAGNOSIS — E1165 Type 2 diabetes mellitus with hyperglycemia: Secondary | ICD-10-CM | POA: Diagnosis present

## 2013-06-17 DIAGNOSIS — F17200 Nicotine dependence, unspecified, uncomplicated: Secondary | ICD-10-CM | POA: Diagnosis present

## 2013-06-17 DIAGNOSIS — Z833 Family history of diabetes mellitus: Secondary | ICD-10-CM

## 2013-06-17 DIAGNOSIS — D649 Anemia, unspecified: Secondary | ICD-10-CM | POA: Diagnosis present

## 2013-06-17 DIAGNOSIS — I10 Essential (primary) hypertension: Secondary | ICD-10-CM | POA: Diagnosis present

## 2013-06-17 DIAGNOSIS — D500 Iron deficiency anemia secondary to blood loss (chronic): Secondary | ICD-10-CM | POA: Diagnosis present

## 2013-06-17 DIAGNOSIS — E119 Type 2 diabetes mellitus without complications: Secondary | ICD-10-CM | POA: Diagnosis present

## 2013-06-17 HISTORY — DX: Alcohol abuse, uncomplicated: F10.10

## 2013-06-17 HISTORY — DX: Acute pancreatitis without necrosis or infection, unspecified: K85.90

## 2013-06-17 HISTORY — DX: Essential (primary) hypertension: I10

## 2013-06-17 LAB — CBC WITH DIFFERENTIAL/PLATELET
HCT: 18.7 % — ABNORMAL LOW (ref 39.0–52.0)
Hemoglobin: 6.7 g/dL — CL (ref 13.0–17.0)
Lymphocytes Relative: 16 % (ref 12–46)
Lymphs Abs: 0.9 10*3/uL (ref 0.7–4.0)
Monocytes Absolute: 0.7 10*3/uL (ref 0.1–1.0)
Monocytes Relative: 12 % (ref 3–12)
Neutro Abs: 3.9 10*3/uL (ref 1.7–7.7)
WBC: 5.5 10*3/uL (ref 4.0–10.5)

## 2013-06-17 LAB — COMPREHENSIVE METABOLIC PANEL
BUN: 33 mg/dL — ABNORMAL HIGH (ref 6–23)
CO2: 23 mEq/L (ref 19–32)
Chloride: 93 mEq/L — ABNORMAL LOW (ref 96–112)
Creatinine, Ser: 0.75 mg/dL (ref 0.50–1.35)
GFR calc non Af Amer: >90 mL/min (ref >90)
Total Bilirubin: 0.2 mg/dL — ABNORMAL LOW (ref 0.3–1.2)

## 2013-06-17 LAB — ETHANOL: Alcohol, Ethyl (B): <11 mg/dL (ref 0–11)

## 2013-06-17 LAB — LIPASE, BLOOD: Lipase: 6 U/L — ABNORMAL LOW (ref 11–59)

## 2013-06-17 MED ORDER — ONDANSETRON HCL 4 MG/2ML IJ SOLN
4.0000 mg | Freq: Once | INTRAMUSCULAR | Status: AC
  Administered 2013-06-17: 4 mg via INTRAVENOUS
  Filled 2013-06-17: qty 2

## 2013-06-17 MED ORDER — MORPHINE SULFATE 4 MG/ML IJ SOLN
4.0000 mg | Freq: Once | INTRAMUSCULAR | Status: AC
  Administered 2013-06-17: 4 mg via INTRAVENOUS
  Filled 2013-06-17: qty 1

## 2013-06-17 MED ORDER — SODIUM CHLORIDE 0.9 % IV BOLUS (SEPSIS)
1000.0000 mL | Freq: Once | INTRAVENOUS | Status: AC
  Administered 2013-06-17: 1000 mL via INTRAVENOUS

## 2013-06-17 NOTE — ED Notes (Signed)
Per PA change acuity to 3.

## 2013-06-17 NOTE — ED Notes (Signed)
Per EMS: pt states that he is having back pain. Pt states that the last time he had back pain he was diagnosed with pancreas problems. Pt ambulatory.

## 2013-06-17 NOTE — ED Provider Notes (Signed)
History    CSN: [id number] Arrival date & time 06/17/13  2052  First MD Initiated Contact with Patient 06/17/13 2107     Chief Complaint  Patient presents with  . Back Pain   (Consider location/radiation/quality/duration/timing/severity/associated sxs/prior Treatment) HPI Comments: Patient presents today with a chief complaint of back pain.  He reports that the pain has been present for the past 3-4 days and is gradually worsening.  Pain located across his lower back.  He has taken Tylenol for the pain without relief. He describes the pain as a sharp pain.  He denies any back injury or trauma.  He states that the pain feels similar to when he has had Pancreatitis in the past.   He reports that in the past he drank a bottle of wine daily.  However, he denies any alcohol use in the past month.  However, family states that they think the patient has continued to drink.  He denies nausea, vomiting, bowel/bladder incontinence, numbness/tingling, fever, or chills.    The history is provided by the patient.   Past Medical History  Diagnosis Date  . Diabetes mellitus    Past Surgical History  Procedure Laterality Date  . Knee surgery    . Pancreas surgery     History reviewed. No pertinent family history. History  Substance Use Topics  . Smoking status: Current Every Day Smoker  . Smokeless tobacco: Never Used  . Alcohol Use: Yes    Review of Systems  Constitutional: Negative for fever and chills.  Respiratory: Negative for shortness of breath.   Gastrointestinal: Negative for nausea, vomiting and abdominal pain.  Musculoskeletal: Positive for back pain.  Neurological: Negative for dizziness, weakness, light-headedness and numbness.  All other systems reviewed and are negative.    Allergies  Tetanus toxoids  Home Medications   Current Outpatient Rx  Name  Route  Sig  Dispense  Refill  . cloNIDine (CATAPRES) 0.1 MG tablet   Oral   Take 1 tablet (0.1 mg total) by mouth 3  (three) times daily.   60 tablet   0   . insulin NPH (HUMULIN N,NOVOLIN N) 100 UNIT/ML injection   Subcutaneous   Inject 6 Units into the skin 2 (two) times daily.         . insulin regular (NOVOLIN R,HUMULIN R) 100 units/mL injection   Subcutaneous   Inject 10 Units into the skin 2 (two) times daily before a meal.         . Multiple Vitamin (MULTIVITAMIN WITH MINERALS) TABS   Oral   Take 1 tablet by mouth daily.   30 tablet   0   . zolpidem (AMBIEN) 5 MG tablet   Oral   Take 5 mg by mouth at bedtime as needed for sleep.          BP 103/61  Pulse 93  Temp(Src) 98 F (36.7 C) (Oral)  Resp 23  SpO2 100% Physical Exam  Nursing note and vitals reviewed. Constitutional: He appears well-developed and well-nourished. No distress.  HENT:  Head: Normocephalic and atraumatic.  Cardiovascular: Normal rate, regular rhythm and normal heart sounds.   Pulmonary/Chest: Effort normal and breath sounds normal.  Abdominal: Soft. Bowel sounds are normal. He exhibits no distension and no mass. There is tenderness in the right upper quadrant, epigastric area and left upper quadrant. There is no rebound and no guarding.  Genitourinary: Guaiac positive stool.  No gross rectal bleeding visualized with rectal exam  Musculoskeletal:  Thoracic back: He exhibits normal range of motion, no tenderness, no bony tenderness, no swelling, no edema and no deformity.       Lumbar back: He exhibits tenderness. He exhibits normal range of motion, no swelling, no edema and no deformity.       Arms: Tenderness to palpation  Neurological: He is alert. He has normal strength. No sensory deficit.  Skin: Skin is warm and dry. He is not diaphoretic.  Psychiatric: He has a normal mood and affect.    ED Course  Procedures (including critical care time) Labs Reviewed  CBC WITH DIFFERENTIAL  COMPREHENSIVE METABOLIC PANEL  LIPASE, BLOOD  ETHANOL  URINALYSIS, ROUTINE W REFLEX MICROSCOPIC   No  results found. No diagnosis found.  10:40 PM Patient found to be anemic with a hemoglobin of 6.7.  He denies hematemesis or hematochezia.  He does report that he has had dark colored stool.  Discussed hemoglobin results with Dr. Ignacia Palma.  Hold clot and type and screen ordered.  Hemoccult will also be performed.  11:30 PM Discussed with Dr Betti Cruz with [facility] who has agreed to admit the patient.  He is requesting that a repeat CBC be ordered to recheck the hemoglobin to ensure that the reading was not a lab error.  MDM  Patient presenting with back pain for the past 3-4 days.  He denies injury to the back.  Patient with a history of Alcoholism and Pancreatitis.  Labs show a Hemoglobin of 6.7.  Last hemoglobin 12.1 on 07/08/12.  Patient denies hematochezia or hematemesis, but has had dark stool.  Hemoccult positive. Patient has a Lipase of 6 at this time.   Patient also found to be hyponatremic.  Patient admitted to [facility] for further management and work up of the anemia.  Pascal Lux [place], PA-C 06/18/13 1420

## 2013-06-17 NOTE — ED Notes (Signed)
Occult blood card-Pos

## 2013-06-17 NOTE — ED Notes (Signed)
Pt is unable to give urine specimen. Pt has been advise to call tech for assistance. The tech reported to nurse in charge.

## 2013-06-17 NOTE — ED Provider Notes (Signed)
MSE was initiated and I personally evaluated the patient and placed orders (if any) at  9:12 PM on June 17, 2013.  Patient presenting with upper back pain with same exact presentation as previous pancreatitis flare ups. Patient endorsed these symptoms were the same. Patient will need further resources, including but not limited to labs and IV given his history and similar presentation of pancreatitis.   The patient appears stable so that the remainder of the MSE may be completed by another provider.  Jeannetta Ellis, PA-C 06/17/13 2114

## 2013-06-18 ENCOUNTER — Inpatient Hospital Stay (HOSPITAL_COMMUNITY): Payer: BC Managed Care – PPO

## 2013-06-18 ENCOUNTER — Encounter (HOSPITAL_COMMUNITY): Payer: Self-pay | Admitting: Internal Medicine

## 2013-06-18 DIAGNOSIS — E876 Hypokalemia: Secondary | ICD-10-CM

## 2013-06-18 DIAGNOSIS — E871 Hypo-osmolality and hyponatremia: Secondary | ICD-10-CM

## 2013-06-18 DIAGNOSIS — F1010 Alcohol abuse, uncomplicated: Secondary | ICD-10-CM

## 2013-06-18 DIAGNOSIS — M545 Low back pain: Secondary | ICD-10-CM | POA: Diagnosis present

## 2013-06-18 DIAGNOSIS — I10 Essential (primary) hypertension: Secondary | ICD-10-CM | POA: Diagnosis present

## 2013-06-18 DIAGNOSIS — D649 Anemia, unspecified: Secondary | ICD-10-CM | POA: Diagnosis present

## 2013-06-18 LAB — GLUCOSE, CAPILLARY
Glucose-Capillary: 103 mg/dL — ABNORMAL HIGH (ref 70–99)
Glucose-Capillary: 104 mg/dL — ABNORMAL HIGH (ref 70–99)
Glucose-Capillary: 121 mg/dL — ABNORMAL HIGH (ref 70–99)
Glucose-Capillary: 129 mg/dL — ABNORMAL HIGH (ref 70–99)

## 2013-06-18 LAB — CBC WITH DIFFERENTIAL/PLATELET
Basophils Absolute: 0 10*3/uL (ref 0.0–0.1)
Eosinophils Absolute: 0 10*3/uL (ref 0.0–0.7)
Eosinophils Relative: 0 % (ref 0–5)
HCT: 17.8 % — ABNORMAL LOW (ref 39.0–52.0)
MCH: 32.2 pg (ref 26.0–34.0)
MCHC: 36 g/dL (ref 30.0–36.0)
MCV: 89.4 fL (ref 78.0–100.0)
Monocytes Absolute: 0.6 10*3/uL (ref 0.1–1.0)
Platelets: 275 10*3/uL (ref 150–400)
RDW: 15.5 % (ref 11.5–15.5)
WBC: 5.2 10*3/uL (ref 4.0–10.5)

## 2013-06-18 LAB — CBC
HCT: 30.6 % — ABNORMAL LOW (ref 39.0–52.0)
Hemoglobin: 10.7 g/dL — ABNORMAL LOW (ref 13.0–17.0)
Hemoglobin: 9 g/dL — ABNORMAL LOW (ref 13.0–17.0)
MCH: 30.9 pg (ref 26.0–34.0)
MCV: 88 fL (ref 78.0–100.0)
RBC: 2.91 MIL/uL — ABNORMAL LOW (ref 4.22–5.81)
RDW: 16.8 % — ABNORMAL HIGH (ref 11.5–15.5)
WBC: 5.7 10*3/uL (ref 4.0–10.5)

## 2013-06-18 LAB — URINALYSIS, ROUTINE W REFLEX MICROSCOPIC
Hgb urine dipstick: NEGATIVE
Ketones, ur: 15 mg/dL — AB
Nitrite: NEGATIVE
Urobilinogen, UA: 0.2 mg/dL (ref 0.0–1.0)
pH: 5 (ref 5.0–8.0)

## 2013-06-18 LAB — COMPREHENSIVE METABOLIC PANEL
BUN: 25 mg/dL — ABNORMAL HIGH (ref 6–23)
Calcium: 8.2 mg/dL — ABNORMAL LOW (ref 8.4–10.5)
GFR calc Af Amer: >90 mL/min (ref >90)
Glucose, Bld: 129 mg/dL — ABNORMAL HIGH (ref 70–99)
Total Protein: 4.9 g/dL — ABNORMAL LOW (ref 6.0–8.3)

## 2013-06-18 LAB — IRON AND TIBC
Saturation Ratios: 21 % (ref 20–55)
TIBC: 211 ug/dL — ABNORMAL LOW (ref 215–435)

## 2013-06-18 LAB — RETICULOCYTES: Retic Ct Pct: 9.7 % — ABNORMAL HIGH (ref 0.4–3.1)

## 2013-06-18 LAB — MAGNESIUM: Magnesium: 1.9 mg/dL (ref 1.5–2.5)

## 2013-06-18 LAB — URINE MICROSCOPIC-ADD ON

## 2013-06-18 LAB — FERRITIN: Ferritin: 420 ng/mL — ABNORMAL HIGH (ref 22–322)

## 2013-06-18 LAB — HEMOGLOBIN A1C: Hgb A1c MFr Bld: 8.6 % — ABNORMAL HIGH (ref <5.7)

## 2013-06-18 MED ORDER — ONDANSETRON HCL 4 MG PO TABS: 4.0000 mg | ORAL_TABLET | Freq: Four times a day (QID) | ORAL | Status: DC | PRN

## 2013-06-18 MED ORDER — SODIUM CHLORIDE 0.9 % IV SOLN: INTRAVENOUS | Status: DC

## 2013-06-18 MED ORDER — ZOLPIDEM TARTRATE 5 MG PO TABS
5.0000 mg | ORAL_TABLET | Freq: Once | ORAL | Status: AC
  Administered 2013-06-18: 5 mg via ORAL
  Filled 2013-06-18: qty 1

## 2013-06-18 MED ORDER — ACETAMINOPHEN 650 MG RE SUPP: 650.0000 mg | Freq: Four times a day (QID) | RECTAL | Status: DC | PRN

## 2013-06-18 MED ORDER — ONDANSETRON HCL 4 MG/2ML IJ SOLN: 4.0000 mg | Freq: Four times a day (QID) | INTRAMUSCULAR | Status: DC | PRN

## 2013-06-18 MED ORDER — FOLIC ACID 1 MG PO TABS
1.0000 mg | ORAL_TABLET | Freq: Every day | ORAL | Status: DC
Start: 2013-06-18 — End: 2013-06-21
  Administered 2013-06-19 – 2013-06-21 (×3): 1 mg via ORAL
  Filled 2013-06-18 (×4): qty 1

## 2013-06-18 MED ORDER — SODIUM CHLORIDE 0.9 % IV SOLN
INTRAVENOUS | Status: AC
  Administered 2013-06-18 (×2): via INTRAVENOUS

## 2013-06-18 MED ORDER — ACETAMINOPHEN 325 MG PO TABS: 650.0000 mg | ORAL_TABLET | Freq: Four times a day (QID) | ORAL | Status: DC | PRN

## 2013-06-18 MED ORDER — PANTOPRAZOLE SODIUM 40 MG IV SOLR
40.0000 mg | Freq: Two times a day (BID) | INTRAVENOUS | Status: DC
  Administered 2013-06-18 – 2013-06-19 (×4): 40 mg via INTRAVENOUS
  Filled 2013-06-18 (×5): qty 40

## 2013-06-18 MED ORDER — POTASSIUM CHLORIDE CRYS ER 20 MEQ PO TBCR
40.0000 meq | EXTENDED_RELEASE_TABLET | Freq: Once | ORAL | Status: AC
  Administered 2013-06-18: 40 meq via ORAL
  Filled 2013-06-18: qty 2

## 2013-06-18 MED ORDER — MORPHINE SULFATE 2 MG/ML IJ SOLN
1.0000 mg | INTRAMUSCULAR | Status: DC | PRN
  Administered 2013-06-18 (×2): 1 mg via INTRAVENOUS
  Filled 2013-06-18 (×3): qty 1

## 2013-06-18 MED ORDER — CHLORHEXIDINE GLUCONATE 0.12 % MT SOLN
OROMUCOSAL | Status: AC
  Filled 2013-06-18: qty 15

## 2013-06-18 MED ORDER — INSULIN NPH (HUMAN) (ISOPHANE) 100 UNIT/ML ~~LOC~~ SUSP
6.0000 [IU] | Freq: Two times a day (BID) | SUBCUTANEOUS | Status: DC
  Administered 2013-06-18 – 2013-06-21 (×6): 6 [IU] via SUBCUTANEOUS
  Filled 2013-06-18 (×3): qty 10

## 2013-06-18 MED ORDER — THIAMINE HCL 100 MG/ML IJ SOLN
Freq: Once | INTRAVENOUS | Status: AC
  Administered 2013-06-18: 02:00:00 via INTRAVENOUS
  Filled 2013-06-18: qty 1000

## 2013-06-18 MED ORDER — CHLORHEXIDINE GLUCONATE 0.12 % MT SOLN
15.0000 mL | Freq: Two times a day (BID) | OROMUCOSAL | Status: DC
  Administered 2013-06-18 – 2013-06-20 (×6): 15 mL via OROMUCOSAL
  Filled 2013-06-18 (×5): qty 15

## 2013-06-18 MED ORDER — INSULIN ASPART 100 UNIT/ML ~~LOC~~ SOLN
0.0000 [IU] | SUBCUTANEOUS | Status: DC
  Administered 2013-06-18 – 2013-06-19 (×2): 2 [IU] via SUBCUTANEOUS
  Administered 2013-06-19: 3 [IU] via SUBCUTANEOUS
  Administered 2013-06-20: 2 [IU] via SUBCUTANEOUS
  Administered 2013-06-20: 1 [IU] via SUBCUTANEOUS
  Administered 2013-06-20: 2 [IU] via SUBCUTANEOUS
  Administered 2013-06-20: 1 [IU] via SUBCUTANEOUS
  Administered 2013-06-21: 3 [IU] via SUBCUTANEOUS
  Administered 2013-06-21: 2 [IU] via SUBCUTANEOUS
  Administered 2013-06-21: 3 [IU] via SUBCUTANEOUS

## 2013-06-18 MED ORDER — VITAMIN B-1 100 MG PO TABS
100.0000 mg | ORAL_TABLET | Freq: Every day | ORAL | Status: DC
  Administered 2013-06-19 – 2013-06-21 (×3): 100 mg via ORAL
  Filled 2013-06-18 (×4): qty 1

## 2013-06-18 MED ORDER — BIOTENE DRY MOUTH MT LIQD
15.0000 mL | Freq: Two times a day (BID) | OROMUCOSAL | Status: DC
  Administered 2013-06-18 – 2013-06-20 (×5): 15 mL via OROMUCOSAL

## 2013-06-18 NOTE — ED Provider Notes (Signed)
Medical screening examination/treatment/procedure(s) were performed by non-physician practitioner and as supervising physician I was immediately available for consultation/collaboration.   Carleene Cooper III, MD 06/18/13 [phone]

## 2013-06-18 NOTE — ED Notes (Signed)
Pt. Back from Xray. 6N informed Pt. On way to their unit.

## 2013-06-18 NOTE — Progress Notes (Signed)
Blood end time for 1st unit -0714.

## 2013-06-18 NOTE — Consult Note (Signed)
Referring Provider: No ref. provider found Primary Care Physician:  Cain Saupe, MD Primary Gastroenterologist:  Dr. Evette Cristal  Reason for Consultation:  Posthemorrhagic anemia  HPI: Benjamin [name] is a 62 y.o. male admitted to the hospital yesterday because of back pain and found to be severely anemic, with a hemoglobin dropping to 6.4 and heme positive stool. No previous history of GI bleeding. No exposure to aspirin or nonsteroidal anti-inflammatory drugs. Hemoglobin was around 12 about a month earlier.   Endoscopy and colonoscopy 9 months ago by Dr. Evette Cristal were unrevealing except for sigmoid diverticulosis and gastric erythema, a finding which had been noted on a previous endoscopy.   The patient has a history of alcoholic pancreatitis and has been drinking as recently as a month ago, a bottle of wine per day. He does smoke about a pack a day.  The patient did not have prodromal dyspeptic symptoms nor any involuntary weight loss.  His last bowel movement was yesterday.  His hemoglobin, following 2 units of packed cells, has risen supratherapeutically from 6.4 to 10.7 today.   Past Medical History  Diagnosis Date  . Diabetes mellitus   . Hypertension   . Alcohol abuse   . Pancreatitis     Past Surgical History  Procedure Laterality Date  . Knee surgery    . Pancreas surgery    Sigmoid colectomy for perforation, with temporary colostomy, approximately 2001  Prior to Admission medications   Medication Sig Start Date End Date Taking? Authorizing Provider  cloNIDine (CATAPRES) 0.1 MG tablet Take 1 tablet (0.1 mg total) by mouth 3 (three) times daily. 07/11/12 07/11/13 Yes Leroy Sea, MD  insulin NPH (HUMULIN N,NOVOLIN N) 100 UNIT/ML injection Inject 6 Units into the skin 2 (two) times daily.   Yes Historical Provider, MD  insulin regular (NOVOLIN R,HUMULIN R) 100 units/mL injection Inject 10 Units into the skin 2 (two) times daily before a meal.   Yes Historical Provider, MD  Multiple  Vitamin (MULTIVITAMIN WITH MINERALS) TABS Take 1 tablet by mouth daily. 07/11/12  Yes Leroy Sea, MD  zolpidem (AMBIEN) 5 MG tablet Take 5 mg by mouth at bedtime as needed for sleep.   Yes Historical Provider, MD    Current Facility-Administered Medications  Medication Dose Route Frequency Provider Last Rate Last Dose  . 0.9 %  sodium chloride infusion   Intravenous Continuous Cristal Ford, MD 75 mL/hr at 06/18/13 0430    . 0.9 %  sodium chloride infusion   Intravenous Continuous Katy Fitch [place], MD      . acetaminophen (TYLENOL) tablet 650 mg  650 mg Oral Q6H PRN Cristal Ford, MD       Or  . acetaminophen (TYLENOL) suppository 650 mg  650 mg Rectal Q6H PRN Cristal Ford, MD      . antiseptic oral rinse (BIOTENE) solution 15 mL  15 mL Mouth Rinse q12n4p Jessica U Vann, DO   15 mL at 06/18/13 1600  . chlorhexidine (PERIDEX) 0.12 % solution 15 mL  15 mL Mouth Rinse BID Joseph Art, DO   15 mL at 06/18/13 1559  . chlorhexidine (PERIDEX) 0.12 % solution           . folic acid (FOLVITE) tablet 1 mg  1 mg Oral Daily Srikar Cherlynn Kaiser, MD      . insulin aspart (novoLOG) injection 0-9 Units  0-9 Units Subcutaneous Q4H Srikar Cherlynn Kaiser, MD      . insulin NPH (HUMULIN N,NOVOLIN N)  injection 6 Units  6 Units Subcutaneous BID Cristal Ford, MD      . morphine 2 MG/ML injection 1 mg  1 mg Intravenous Q4H PRN Cristal Ford, MD   1 mg at 06/18/13 0858  . ondansetron (ZOFRAN) tablet 4 mg  4 mg Oral Q6H PRN Cristal Ford, MD       Or  . ondansetron (ZOFRAN) injection 4 mg  4 mg Intravenous Q6H PRN Cristal Ford, MD      . pantoprazole (PROTONIX) injection 40 mg  40 mg Intravenous Q12H Cristal Ford, MD   40 mg at 06/18/13 1400  . thiamine (VITAMIN B-1) tablet 100 mg  100 mg Oral Daily Cristal Ford, MD        Allergies as of 06/17/2013 - Review Complete 06/17/2013  Allergen Reaction Noted  . Tetanus toxoids  06/17/2013    Family History  Problem Relation Age of Onset  . Diabetes Mother    . Dementia Father     History   Social History  . Marital Status: Single    Spouse Name: N/A    Number of Children: N/A  . Years of Education: N/A   Occupational History  .  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 Smoking status: Current Every Day Smoker  . Smokeless tobacco: Never Used  . Alcohol Use: Yes  . Drug Use: No  . Sexually Active: No   Other Topics Concern  . Not on file   Social History Narrative  . No narrative on file    Review of Systems: Nausea, vomiting, and hematemesis   Physical Exam: Vital signs in last 24 hours: Temp:  [97.6 F (36.4 C)-98.6 F (37 C)] 98.2 F (36.8 C) (07/14 1655) Pulse Rate:  [69-116] 73 (07/14 1655) Resp:  [13-23] 18 (07/14 1655) BP: (103-137)/(53-76) 126/71 mmHg (07/14 1655) SpO2:  [98 %-100 %] 100 % (07/14 1655) Weight:  [67.2 kg (148 lb 2.4 oz)] 67.2 kg (148 lb 2.4 oz) (07/14 0445) Last BM Date: 06/09/13 General:   Alert,  Well-developed, well-nourished, pleasant and cooperative in NAD Head:  Normocephalic and atraumatic. Eyes:  Sclera clear, no icterus.   Conjunctiva pink. Mouth:   No ulcerations or lesions.  Oropharynx pink & moist. Lungs:  Clear throughout to auscultation.   No wheezes, crackles, or rhonchi. No evident respiratory distress. Heart:   Regular rate and rhythm; no murmurs, clicks, rubs,  or gallops. Abdomen:  Soft, nontender, nontympanitic, and nondistended. No masses, hepatosplenomegaly or ventral hernias noted. Normal bowel sounds, without bruits, guarding, or rebound.   Msk:   Symmetrical without gross deformities. Extremities:   Without clubbing, cyanosis, or edema. Neurologic:  Alert and coherent;  grossly normal neurologically. No evident tremulousness. Skin:  Multiple petechiae. No clubbing, no palmar erythema, no spider angiomata. Psych:   Alert and cooperative. Normal mood and affect.  Intake/Output from previous day: 07/13 0701 - 07/14  0700 In: 282 [Blood:282] Out: -  Intake/Output this shift: Total I/O In: 0  Out: 175 [Urine:175]  Lab Results:  Recent Labs  06/17/13 2136 06/17/13 2343 06/18/13 1552  WBC 5.5 5.2 5.7  HGB 6.7* 6.4* 10.7*  HCT 18.7* 17.8* 30.6*  PLT 298 275 279   BMET  Recent Labs  06/17/13 2136 06/18/13 0840  NA 127* 127*  K 3.3* 3.2*  CL 93* 95*  CO2 23 21  GLUCOSE 117* 129*  BUN  33* 25*  CREATININE 0.75 0.64  CALCIUM 8.2* 8.2*   LFT  Recent Labs  06/18/13 0840  PROT 4.9*  ALBUMIN 2.4*  AST 10  ALT 15  ALKPHOS 112  BILITOT 0.5   PT/INR No results found for this basename: LABPROT, INR,  in the last 72 hours  Studies/Results: Dg Lumbar Spine Complete  06/18/2013   *RADIOLOGY REPORT*  Clinical Data: Lower back pain.  LUMBAR SPINE - COMPLETE 4+ VIEW  Comparison: None.  Findings: There is no evidence of fracture or subluxation. Vertebral bodies demonstrate normal height and alignment. Intervertebral disc spaces are preserved.  The visualized neural foramina are grossly unremarkable in appearance.  The visualized bowel gas pattern is unremarkable in appearance; air and stool are noted within the colon.  The sacroiliac joints are within normal limits.  Scattered vascular calcifications are seen.  IMPRESSION: No evidence of fracture or subluxation along the lumbar spine.   Original Report Authenticated By: Tonia Ghent, M.D.    Impression: 1. Anemia, subacute, without obvious correlative GI bleeding, in the setting of heme positive stool, a history of significant alcohol consumption, and a negative colonoscopy except for a few diverticula less than a year ago.  I wonder about the possibility of portal congestive gastropathy, since he does not really have factors for ulcer disease and this does not sound like a variceal bleed.  Plan: Proceed to endoscopic evaluation tomorrow. Patient agreeable. Further management to deep depend on endoscopic findings.   LOS: 1 day    Frankye Schwegel V  06/18/2013, 6:31 PM

## 2013-06-18 NOTE — Progress Notes (Signed)
[facility] PROGRESS NOTE  Benjamin [name] ZOX:[id number] DOB: February 24, 1951 DOA: 06/17/2013 PCP: Cain Saupe, MD  Assessment/Plan: Anemia due to presumed GI bleed  Patient's hemoglobin is 6.4, was 12.1 on 07/08/2012. Given patient's complaints of black stools, suspect likely upper GI in origin. Patient indicates that he has had an endoscopy about 3-4 months ago, by Dr. Andrey Campanile. Given alcohol use, concern for possible variceal bleed. Cycle CBC every 8 hours. Transfuse patient 2 units of PRBC. Start pantoprazole 40 mg IV twice a day. Patient n.p.o. for any possible procedures in the morning. Consulted [facility]   Diabetes uncontrolled with complications  Sliding scale insulin.   Alcohol abuse  Patient reports last use of alcohol more than a month. Start thiamine and folic acid. If any signs of withdrawal initiate CIWA protocol.   Low back pain  Suspect is likely musculoskeletal.  lumbar x-ray with no abnormalities.   Hypokalemia  Replace as needed. Check magnesium.   Hyponatremia  Suspect is likely due to chronic alcohol use. Continue to monitor.   Hypertension  Patient normotensive. Hold his clonidine for now.   Prophylaxis  SCDs, no heparin products due to concern for possible bleed   Code Status: full Family Communication: patient at bedside Disposition Plan:    Consultants:  [facility]  Procedures:    Antibiotics:    HPI/Subjective: Had an EGD done 4 months ago by Dr. Andrey Campanile  Objective: Filed Vitals:   06/18/13 0445 06/18/13 0545 06/18/13 0714 06/18/13 0819  BP: 113/60 107/62 110/66 131/76  Pulse: 76 77 75 80  Temp: 98.4 F (36.9 C) 98.2 F (36.8 C) 98.6 F (37 C) 97.9 F (36.6 C)  TempSrc:   Oral Oral  Resp: 16 16 16 16   Height: 5\' 10"  (1.778 m)     Weight: 67.2 kg (148 lb 2.4 oz)     SpO2:   100%     Intake/Output Summary (Last 24 hours) at 06/18/13 0836 Last data filed at 06/18/13 0430  Gross per 24 hour  Intake    282 ml  Output      0 ml  Net     282 ml   Filed Weights   06/18/13 0445  Weight: 67.2 kg (148 lb 2.4 oz)    Exam:   General:  A+Ox3, NAD  Cardiovascular: rrr  Respiratory: clear anterior  Abdomen: +BS, soft  Musculoskeletal: moves all 4 ext  Data Reviewed: Basic Metabolic Panel:  Recent Labs Lab 06/17/13 2136  NA 127*  K 3.3*  CL 93*  CO2 23  GLUCOSE 117*  BUN 33*  CREATININE 0.75  CALCIUM 8.2*   Liver Function Tests:  Recent Labs Lab 06/17/13 2136  AST 17  ALT 17  ALKPHOS 117  BILITOT 0.2*  PROT 5.1*  ALBUMIN 2.4*    Recent Labs Lab 06/17/13 2136  LIPASE 6*   No results found for this basename: AMMONIA,  in the last 168 hours CBC:  Recent Labs Lab 06/17/13 2136 06/17/13 2343  WBC 5.5 5.2  NEUTROABS 3.9 3.5  HGB 6.7* 6.4*  HCT 18.7* 17.8*  MCV 89.5 89.4  PLT 298 275   Cardiac Enzymes: No results found for this basename: CKTOTAL, CKMB, CKMBINDEX, TROPONINI,  in the last 168 hours BNP (last 3 results) No results found for this basename: PROBNP,  in the last 8760 hours CBG:  Recent Labs Lab 06/17/13 2304 06/18/13 0127 06/18/13 0504 06/18/13 0816  GLUCAP 105* 103* 121* 128*    No results found for this  or any previous visit (from the past 240 hour(s)).   Studies: Dg Lumbar Spine Complete  06/18/2013   *RADIOLOGY REPORT*  Clinical Data: Lower back pain.  LUMBAR SPINE - COMPLETE 4+ VIEW  Comparison: None.  Findings: There is no evidence of fracture or subluxation. Vertebral bodies demonstrate normal height and alignment. Intervertebral disc spaces are preserved.  The visualized neural foramina are grossly unremarkable in appearance.  The visualized bowel gas pattern is unremarkable in appearance; air and stool are noted within the colon.  The sacroiliac joints are within normal limits.  Scattered vascular calcifications are seen.  IMPRESSION: No evidence of fracture or subluxation along the lumbar spine.   Original Report Authenticated By: Tonia Ghent, M.D.     Scheduled Meds: . antiseptic oral rinse  15 mL Mouth Rinse q12n4p  . chlorhexidine  15 mL Mouth Rinse BID  . folic acid  1 mg Oral Daily  . insulin aspart  0-9 Units Subcutaneous Q4H  . insulin NPH  6 Units Subcutaneous BID  . pantoprazole (PROTONIX) IV  40 mg Intravenous Q12H  . thiamine  100 mg Oral Daily   Continuous Infusions: . sodium chloride 75 mL/hr at 06/18/13 0430    Principal Problem:   Anemia Active Problems:   Hyponatremia   ETOH abuse   Diabetes mellitus   Low back pain   Hypertension   Hypokalemia    Time spent: 35 min    VANN, JESSICA  [facility] Pager [phone]. If 7PM-7AM, please contact night-coverage at [email or web address], password [facility] 06/18/2013, 8:36 AM  LOS: 1 day

## 2013-06-18 NOTE — ED Provider Notes (Signed)
Medical screening examination/treatment/procedure(s) were performed by non-physician practitioner and as supervising physician I was immediately available for consultation/collaboration.   Carleene Cooper III, MD 06/18/13 [phone]

## 2013-06-18 NOTE — H&P (Signed)
Patient's PCP: Cain Saupe, MD  Chief Complaint: Back pain  History of Present Illness: Benjamin [name] is a 62 y.o. Caucasian male with history of hypertension, diabetes, pancreatitis, and alcohol abuse who presents with the above complaints.  Patient reports that his symptoms started one week ago when he noted low back pain which since then has been getting worse.  Denies any recent falls or injuries.  He has had history of pancreatitis, he feels his pain is similar to his pancreatitis pain, as a result he presented to the emergency department for further evaluation.  In the emergency department patient was found to be anemic with hemoglobin of 6.4.  Hospitalist service was asked to admit the patient for further care and management.  Patient denies any NSAID use.  He denies any hematemesis, or any blood in stools.  He does admit over the last month he has noted his stools being more black.  Of note patient has a history of heavy alcohol use drinking 1-1/2 bottles of wine daily.  He reports that he has not had any alcohol use over the last month, and is focused on alcohol cessation.  He denies any recent fevers, chills, nausea, vomiting, chest pain, shortness of breath, abdominal pain, diarrhea, headaches or vision changes.  Review of Systems: All systems reviewed with the patient and positive as per history of present illness, otherwise all other systems are negative.  Past Medical History  Diagnosis Date  . Diabetes mellitus   . Hypertension   . Alcohol abuse   . Pancreatitis    Past Surgical History  Procedure Laterality Date  . Knee surgery    . Pancreas surgery     Family History  Problem Relation Age of Onset  . Diabetes Mother   . Dementia Father    History   Social History  . Marital Status: Single    Spouse Name: N/A    Number of Children: N/A  . Years of Education: N/A   Occupational History  . Not on file.   Social History Main Topics  . Smoking status: Current Every  Day Smoker  . Smokeless tobacco: Never Used  . Alcohol Use: Yes  . Drug Use: No  . Sexually Active: No   Other Topics Concern  . Not on file   Social History Narrative  . No narrative on file   Allergies: Tetanus toxoids  Home Meds: Prior to Admission medications   Medication Sig Start Date End Date Taking? Authorizing Provider  cloNIDine (CATAPRES) 0.1 MG tablet Take 1 tablet (0.1 mg total) by mouth 3 (three) times daily. 07/11/12 07/11/13 Yes Leroy Sea, MD  insulin NPH (HUMULIN N,NOVOLIN N) 100 UNIT/ML injection Inject 6 Units into the skin 2 (two) times daily.   Yes Historical Provider, MD  insulin regular (NOVOLIN R,HUMULIN R) 100 units/mL injection Inject 10 Units into the skin 2 (two) times daily before a meal.   Yes Historical Provider, MD  Multiple Vitamin (MULTIVITAMIN WITH MINERALS) TABS Take 1 tablet by mouth daily. 07/11/12  Yes Leroy Sea, MD  zolpidem (AMBIEN) 5 MG tablet Take 5 mg by mouth at bedtime as needed for sleep.   Yes Historical Provider, MD    Physical Exam: Blood pressure 122/58, pulse 81, temperature 98 F (36.7 C), temperature source Oral, resp. rate 15, SpO2 100.00%. General: Awake, Oriented x3, No acute distress, disheveled and pale in appearance. HEENT: EOMI, Moist mucous membranes Neck: Supple CV: S1 and S2 Lungs: Clear to ascultation bilaterally Abdomen:  Soft, Nontender, Nondistended, +bowel sounds. Ext: Good pulses. Trace edema. No clubbing or cyanosis noted, multiple scratch marks on his hands bilaterally which he attributes to his cat. Neuro: Cranial Nerves II-XII grossly intact. Has 5/5 motor strength in upper and lower extremities. Back: Tender over the lower bilateral paraspinous muscles.  Lab results:  Recent Labs  06/17/13 2136  NA 127*  K 3.3*  CL 93*  CO2 23  GLUCOSE 117*  BUN 33*  CREATININE 0.75  CALCIUM 8.2*    Recent Labs  06/17/13 2136  AST 17  ALT 17  ALKPHOS 117  BILITOT 0.2*  PROT 5.1*  ALBUMIN 2.4*     Recent Labs  06/17/13 2136  LIPASE 6*    Recent Labs  06/17/13 2136 06/17/13 2343  WBC 5.5 5.2  NEUTROABS 3.9 3.5  HGB 6.7* 6.4*  HCT 18.7* 17.8*  MCV 89.5 89.4  PLT 298 275   No results found for this basename: CKTOTAL, CKMB, CKMBINDEX, TROPONINI,  in the last 72 hours No components found with this basename: POCBNP,  No results found for this basename: DDIMER,  in the last 72 hours No results found for this basename: HGBA1C,  in the last 72 hours No results found for this basename: CHOL, HDL, LDLCALC, TRIG, CHOLHDL, LDLDIRECT,  in the last 72 hours No results found for this basename: TSH, T4TOTAL, FREET3, T3FREE, THYROIDAB,  in the last 72 hours No results found for this basename: VITAMINB12, FOLATE, FERRITIN, TIBC, IRON, RETICCTPCT,  in the last 72 hours Imaging results:  No results found. Other results: EKG: Sinus rhythm with first degree AV block, heart rate 81.  Assessment & Plan by Problem: Anemia due to presumed GI bleed Patient's hemoglobin is 6.4, was 12.1 on 07/08/2013.  Given patient's complaints of black stools, suspect likely upper GI in origin.  Patient indicates that he has had an endoscopy about 3-4 months ago, he cannot recall which physician or which group performed the upper endoscopy.  Given alcohol use, concern for possible variceal bleed.  Cycle CBC every 8 hours.  Transfuse patient 2 units of PRBC.  Start pantoprazole 40 mg IV twice a day.  His [facility] physician is [facility], morning rounding physician to consult [facility] in AM.  Patient hemodynamically stable at this time.  Patient n.p.o. for any possible procedures in the morning.  Check anemia panel prior to transfusion.  Diabetes uncontrolled with complications Sliding scale insulin.  Alcohol abuse Patient reports last use of alcohol more than a month.  Start thiamine and folic acid.  If any signs of withdrawal initiate CIWA protocol.  Low back pain Suspect is likely musculoskeletal.  Will get  lumbar x-ray.  Hypokalemia Replace as needed.  Check magnesium in the morning.  Hyponatremia Suspect is likely due to chronic alcohol use.  Continue to monitor.  Hypertension Patient normotensive.  Hold his clonidine for now.  Prophylaxis SCDs, no heparin products due to concern for possible bleed.  CODE STATUS Full code.  Disposition Admit the patient to MedSurg as inpatient.  Time spent on admission, talking to the patient, and coordinating care was: 60 mins.  Jacoya Bauman A, MD 06/18/2013, 12:35 AM

## 2013-06-19 ENCOUNTER — Encounter (HOSPITAL_COMMUNITY)
Admission: EM | Disposition: A | Discharge status: Home / Self Care | Payer: Self-pay | Source: Home / Self Care | Attending: Internal Medicine

## 2013-06-19 ENCOUNTER — Encounter (HOSPITAL_COMMUNITY): Payer: Self-pay | Admitting: *Deleted

## 2013-06-19 DIAGNOSIS — E119 Type 2 diabetes mellitus without complications: Secondary | ICD-10-CM

## 2013-06-19 DIAGNOSIS — M549 Dorsalgia, unspecified: Secondary | ICD-10-CM

## 2013-06-19 HISTORY — PX: ESOPHAGOGASTRODUODENOSCOPY: SHX5428

## 2013-06-19 LAB — GLUCOSE, CAPILLARY
Glucose-Capillary: 180 mg/dL — ABNORMAL HIGH (ref 70–99)
Glucose-Capillary: 103 mg/dL — ABNORMAL HIGH (ref 70–99)
Glucose-Capillary: 182 mg/dL — ABNORMAL HIGH (ref 70–99)

## 2013-06-19 LAB — BASIC METABOLIC PANEL
Chloride: 101 mEq/L (ref 96–112)
GFR calc Af Amer: >90 mL/min (ref >90)
GFR calc non Af Amer: >90 mL/min (ref >90)
Potassium: 3.3 mEq/L — ABNORMAL LOW (ref 3.5–5.1)
Sodium: 134 mEq/L — ABNORMAL LOW (ref 135–145)

## 2013-06-19 LAB — CBC
HCT: 31.5 % — ABNORMAL LOW (ref 39.0–52.0)
Hemoglobin: 11.1 g/dL — ABNORMAL LOW (ref 13.0–17.0)
RBC: 3.61 MIL/uL — ABNORMAL LOW (ref 4.22–5.81)

## 2013-06-19 SURGERY — EGD (ESOPHAGOGASTRODUODENOSCOPY)
Anesthesia: Moderate Sedation

## 2013-06-19 MED ORDER — ZOLPIDEM TARTRATE 5 MG PO TABS
5.0000 mg | ORAL_TABLET | Freq: Once | ORAL | Status: AC
  Administered 2013-06-19: 5 mg via ORAL
  Filled 2013-06-19: qty 1

## 2013-06-19 MED ORDER — MIDAZOLAM HCL 10 MG/2ML IJ SOLN
INTRAMUSCULAR | Status: DC | PRN
  Administered 2013-06-19 (×2): 2.5 mg via INTRAVENOUS

## 2013-06-19 MED ORDER — SODIUM CHLORIDE 0.9 % IV SOLN
INTRAVENOUS | Status: DC
  Administered 2013-06-19: 14:00:00 via INTRAVENOUS

## 2013-06-19 MED ORDER — BUTAMBEN-TETRACAINE-BENZOCAINE 2-2-14 % EX AERO
INHALATION_SPRAY | CUTANEOUS | Status: DC | PRN
  Administered 2013-06-19: 2 via TOPICAL

## 2013-06-19 MED ORDER — FENTANYL CITRATE 0.05 MG/ML IJ SOLN
INTRAMUSCULAR | Status: DC | PRN
  Administered 2013-06-19 (×2): 25 ug via INTRAVENOUS

## 2013-06-19 MED ORDER — DIPHENHYDRAMINE HCL 50 MG/ML IJ SOLN
INTRAMUSCULAR | Status: AC
  Filled 2013-06-19: qty 1

## 2013-06-19 MED ORDER — SUCRALFATE 1 GM/10ML PO SUSP
1.0000 g | Freq: Three times a day (TID) | ORAL | Status: DC
  Administered 2013-06-19 – 2013-06-21 (×9): 1 g via ORAL
  Filled 2013-06-19 (×10): qty 10

## 2013-06-19 MED ORDER — MIDAZOLAM HCL 5 MG/ML IJ SOLN
INTRAMUSCULAR | Status: AC
  Filled 2013-06-19: qty 3

## 2013-06-19 MED ORDER — POTASSIUM CHLORIDE CRYS ER 20 MEQ PO TBCR
40.0000 meq | EXTENDED_RELEASE_TABLET | Freq: Once | ORAL | Status: AC
  Administered 2013-06-19: 40 meq via ORAL
  Filled 2013-06-19: qty 2

## 2013-06-19 MED ORDER — DIPHENHYDRAMINE HCL 50 MG/ML IJ SOLN
INTRAMUSCULAR | Status: DC | PRN
  Administered 2013-06-19 (×2): 12.5 mg via INTRAVENOUS

## 2013-06-19 MED ORDER — FENTANYL CITRATE 0.05 MG/ML IJ SOLN
INTRAMUSCULAR | Status: AC
  Filled 2013-06-19: qty 4

## 2013-06-19 MED ORDER — PANTOPRAZOLE SODIUM 40 MG PO TBEC
40.0000 mg | DELAYED_RELEASE_TABLET | Freq: Two times a day (BID) | ORAL | Status: DC
  Administered 2013-06-19 – 2013-06-21 (×4): 40 mg via ORAL
  Filled 2013-06-19 (×4): qty 1

## 2013-06-19 NOTE — Progress Notes (Signed)
Back from EGD, no changes noted from previous assessment. VSS. Will continue to monitor.

## 2013-06-19 NOTE — Progress Notes (Signed)
[facility] PROGRESS NOTE  Benjamin [name] ZOX:[id number] DOB: Oct 19, 1951 DOA: 06/17/2013 PCP: Cain Saupe, MD  Assessment/Plan: Anemia due to presumed GI bleed  Patient's hemoglobin is 6.4, was 12.1 on 07/08/2012.s/p EGD- appreciate GI consult/Dr. bucchini- discussed with him -CT scan abd r/o perf -repeat EGD -protonix BID until repeat EGD sulcrafate QID x 1 week Clears/ advance as tol  Diabetes uncontrolled with complications  Sliding scale insulin.   Alcohol abuse  Patient reports last use of alcohol more than a month. Start thiamine and folic acid. If any signs of withdrawal initiate CIWA protocol.   Low back pain  Suspect is likely musculoskeletal.  lumbar x-ray with no abnormalities.   Hypokalemia  Replace as needed. Check magnesium.   Hyponatremia  Suspect is likely due to chronic alcohol use. improved  Hypertension  Patient normotensive. Hold his clonidine for now.   Prophylaxis  SCDs, no heparin products   Code Status: full Family Communication: patient at bedside Disposition Plan:    Consultants:  [facility]  Procedures:    Antibiotics:    HPI/Subjective: Had an EGD done 4 months ago by Dr. Andrey Campanile  Objective: Filed Vitals:   06/18/13 2146 06/19/13 0545 06/19/13 1015 06/19/13 1155  BP: 138/64 160/75 163/89 152/82  Pulse: 69 73    Temp: 98.4 F (36.9 C) 98.3 F (36.8 C) 98.6 F (37 C)   TempSrc: Oral Oral Oral   Resp: 19 19 17 17   Height:      Weight:      SpO2: 100% 100% 100% 100%    Intake/Output Summary (Last 24 hours) at 06/19/13 1218 Last data filed at 06/18/13 2356  Gross per 24 hour  Intake      0 ml  Output    500 ml  Net   -500 ml   Filed Weights   06/18/13 0445  Weight: 67.2 kg (148 lb 2.4 oz)    Exam:   General:  A+Ox3, NAD  Cardiovascular: rrr  Respiratory: clear anterior  Abdomen: +BS, soft  Musculoskeletal: moves all 4 ext  Data Reviewed: Basic Metabolic Panel:  Recent Labs Lab 06/17/13 2136  06/18/13 0840 06/19/13 0545  NA 127* 127* 134*  K 3.3* 3.2* 3.3*  CL 93* 95* 101  CO2 23 21 20   GLUCOSE 117* 129* 94  BUN 33* 25* 15  CREATININE 0.75 0.64 0.51  CALCIUM 8.2* 8.2* 8.4  MG  --  1.9  --    Liver Function Tests:  Recent Labs Lab 06/17/13 2136 06/18/13 0840  AST 17 10  ALT 17 15  ALKPHOS 117 112  BILITOT 0.2* 0.5  PROT 5.1* 4.9*  ALBUMIN 2.4* 2.4*    Recent Labs Lab 06/17/13 2136  LIPASE 6*   No results found for this basename: AMMONIA,  in the last 168 hours CBC:  Recent Labs Lab 06/17/13 2136 06/17/13 2343 06/18/13 1552 06/18/13 2130 06/19/13 0545  WBC 5.5 5.2 5.7 6.2 5.4  NEUTROABS 3.9 3.5  --   --   --   HGB 6.7* 6.4* 10.7* 9.0* 11.1*  HCT 18.7* 17.8* 30.6* 25.6* 31.5*  MCV 89.5 89.4 88.2 88.0 87.3  PLT 298 275 279 263 263   Cardiac Enzymes: No results found for this basename: CKTOTAL, CKMB, CKMBINDEX, TROPONINI,  in the last 168 hours BNP (last 3 results) No results found for this basename: PROBNP,  in the last 8760 hours CBG:  Recent Labs Lab 06/18/13 1629 06/18/13 1958 06/18/13 2355 06/19/13 0356 06/19/13 0754  GLUCAP 151* 180*  104* 90 103*    No results found for this or any previous visit (from the past 240 hour(s)).   Studies: Dg Lumbar Spine Complete  06/18/2013   *RADIOLOGY REPORT*  Clinical Data: Lower back pain.  LUMBAR SPINE - COMPLETE 4+ VIEW  Comparison: None.  Findings: There is no evidence of fracture or subluxation. Vertebral bodies demonstrate normal height and alignment. Intervertebral disc spaces are preserved.  The visualized neural foramina are grossly unremarkable in appearance.  The visualized bowel gas pattern is unremarkable in appearance; air and stool are noted within the colon.  The sacroiliac joints are within normal limits.  Scattered vascular calcifications are seen.  IMPRESSION: No evidence of fracture or subluxation along the lumbar spine.   Original Report Authenticated By: Tonia Ghent, M.D.     Scheduled Meds: . [facility] HOLD] antiseptic oral rinse  15 mL Mouth Rinse q12n4p  . [facility] HOLD] chlorhexidine  15 mL Mouth Rinse BID  . [facility] HOLD] folic acid  1 mg Oral Daily  . [[facility] HOLD] insulin aspart  0-9 Units Subcutaneous Q4H  . [MAR HOLD] insulin NPH  6 Units Subcutaneous BID  . [MAR HOLD] pantoprazole (PROTONIX) IV  40 mg Intravenous Q12H  . [facility] HOLD] thiamine  100 mg Oral Daily   Continuous Infusions: . sodium chloride    . sodium chloride      Principal Problem:   Anemia Active Problems:   Hyponatremia   ETOH abuse   Diabetes mellitus   Low back pain   Hypertension   Hypokalemia    Time spent: 35 min    Ishika Chesterfield  [facility] Pager [phone]. If 7PM-7AM, please contact night-coverage at [email or web address], password [facility] 06/19/2013, 12:18 PM  LOS: 2 days

## 2013-06-19 NOTE — Op Note (Signed)
[facility] [address] [place], [address]   ENDOSCOPY PROCEDURE REPORT  PATIENT: Masayoshi, Couzens  MR#: [id number] BIRTHDATE: [birth year] , 61  yrs. old GENDER: Male ENDOSCOPIST:Luwanna Brossman, MD REFERRED BY:  Cain Saupe, MD PROCEDURE DATE:  06/19/2013 PROCEDURE:      upper endoscopy with biopsies ASA CLASS: INDICATIONS:   GI bleed characterized by heme positive stool and severe drop in hemoglobin in the setting of moderate aspirin exposure and heavy ethanol consumption MEDICATION:    Benadryl 25 mg, fentanyl 50 mcg, Versed 5 mg IV TOPICAL ANESTHETIC:  DESCRIPTION OF PROCEDURE:   the patient was brought from his hospital room to the [facility] cone endoscopy unit. He received the above sedation after providing consent and doing an appropriate time out. He remained stable throughout the procedure and tolerated it well.  The Pentax video endoscope was passed under direct vision. The vocal cords looked normal and the esophagus was readily entered.  The esophagus was normal. Specifically, there was no evidence of esophageal varices, nor any Barrett's esophagus, Mallory-Weiss tear, hiatal hernia, ring, reflux esophagitis, or stricture.  The stomach was entered. It contained no significant residual and had normal mucosa, apart from a few tiny, superficial antral ulcerations, specifically without evidence of portal gastropathy. A retroflexed view of the cardia showed no evidence of gastric varices.  The pylorus and duodenal bulb were unremarkable.  However, the post bulbar duodenum was severely and extensively ulcerated, with a great deal of edema and extensive exudate, but without any clear stigmata of hemorrhage. I did not attempt to pass the scope through this area in view of the deformity, stenosis, and deep ulceration.  Prior to removal of the scope, antral biopsies were obtained to check for Helicobacter pylori  infection.     COMPLICATIONS: None  ENDOSCOPIC IMPRESSION:  1. Extensive duodenal ulceration, possibly as a consequence of his aspirin usage and cigarette smoking. This would readily account for his presentation with severe anemia.  2. No evidence of portal hypertension, specifically portal gastropathy, esophageal varices, or gastric varices, despite his heavy ethanol intake.  RECOMMENDATIONS:  1. Await pathology on biopsies and treat Helicobacter pylori if positive 2. Add sucralfate to the patient's regimen for the next week or so, to help "patch" the ulcer 3. I would favor a CT scan to help rule out posterior penetration or even contained perforation, in view of the location of the ulcer, the deepness of the ulcer, and the fact that the patient presented with back pain. 4. Okay for clear liquid diet, with dietary advancement if clear liquids are tolerated. The patient may or may not have functional obstruction in the second portion of his duodenum. If uncertainty persists, we could obtain an upper GI series.    _______________________________ Rosalie DoctorBernette Redbird, MD 06/19/2013 4:37 PM    PATIENT NAME:  Eliga, Arvie MR#: [id number]

## 2013-06-20 ENCOUNTER — Inpatient Hospital Stay (HOSPITAL_COMMUNITY): Payer: BC Managed Care – PPO

## 2013-06-20 ENCOUNTER — Encounter (HOSPITAL_COMMUNITY): Payer: Self-pay | Admitting: Gastroenterology

## 2013-06-20 LAB — TYPE AND SCREEN
ABO/RH(D): O POS
Antibody Screen: NEGATIVE
DAT, IgG: NEGATIVE
Unit division: 0
Unit division: 0
Unit division: 0
Unit division: 0

## 2013-06-20 LAB — CBC
HCT: 30.6 % — ABNORMAL LOW (ref 39.0–52.0)
Hemoglobin: 10.7 g/dL — ABNORMAL LOW (ref 13.0–17.0)
MCH: 31 pg (ref 26.0–34.0)
MCHC: 35 g/dL (ref 30.0–36.0)
MCV: 88.7 fL (ref 78.0–100.0)
Platelets: 291 10*3/uL (ref 150–400)
RBC: 3.45 MIL/uL — ABNORMAL LOW (ref 4.22–5.81)
RDW: 18.1 % — ABNORMAL HIGH (ref 11.5–15.5)
WBC: 6.2 10*3/uL (ref 4.0–10.5)

## 2013-06-20 LAB — BASIC METABOLIC PANEL
CO2: 26 mEq/L (ref 19–32)
Calcium: 8.6 mg/dL (ref 8.4–10.5)
Creatinine, Ser: 0.5 mg/dL (ref 0.50–1.35)
GFR calc non Af Amer: >90 mL/min (ref >90)
Glucose, Bld: 91 mg/dL (ref 70–99)
Sodium: 135 mEq/L (ref 135–145)

## 2013-06-20 LAB — GLUCOSE, CAPILLARY
Glucose-Capillary: 75 mg/dL (ref 70–99)
Glucose-Capillary: 155 mg/dL — ABNORMAL HIGH (ref 70–99)

## 2013-06-20 MED ORDER — ZOLPIDEM TARTRATE 5 MG PO TABS
5.0000 mg | ORAL_TABLET | Freq: Once | ORAL | Status: AC
  Administered 2013-06-20: 5 mg via ORAL
  Filled 2013-06-20: qty 1

## 2013-06-20 MED ORDER — IOHEXOL 300 MG/ML  SOLN
80.0000 mL | Freq: Once | INTRAMUSCULAR | Status: AC | PRN
  Administered 2013-06-20: 80 mL via INTRAVENOUS

## 2013-06-20 MED ORDER — IOHEXOL 300 MG/ML  SOLN
25.0000 mL | INTRAMUSCULAR | Status: AC
  Administered 2013-06-20: 25 mL via ORAL

## 2013-06-20 MED ORDER — CLONIDINE HCL 0.1 MG PO TABS
0.1000 mg | ORAL_TABLET | Freq: Three times a day (TID) | ORAL | Status: DC
Start: 2013-06-20 — End: 2013-06-21
  Administered 2013-06-20 – 2013-06-21 (×3): 0.1 mg via ORAL
  Filled 2013-06-20 (×4): qty 1

## 2013-06-20 NOTE — Progress Notes (Signed)
Addendum:   Pt has office follow-up appt w/ me for Aug 15th at 3:45 pm  Florencia Reasons, M.D. [phone]

## 2013-06-20 NOTE — Progress Notes (Signed)
[facility] PROGRESS NOTE  Benjamin [name] ZOX:[id number] DOB: 1951-01-29 DOA: 06/17/2013 PCP: Cain Saupe, MD  Assessment/Plan: Anemia due to presumed GI bleed  s/p EGD- appreciate GI -CT scan abd r/o perf was negative -repeat EGD at later date to ensure healing -protonix BID until repeat EGD sulcrafate QID x 1 week Clears/ advance as tol  Diabetes uncontrolled with complications  Sliding scale insulin.   Alcohol abuse  Patient reports last use of alcohol more than a month. Start thiamine and folic acid. If any signs of withdrawal initiate CIWA protocol.   Low back pain  Suspect is likely musculoskeletal.  lumbar x-ray with no abnormalities.   Hypokalemia  Replace as needed. Check magnesium.   Hyponatremia  Suspect is likely due to chronic alcohol use. improved  Hypertension  Patient normotensive. Hold his clonidine for now.   Prophylaxis  SCDs, no heparin products   Code Status: full Family Communication: patient at bedside Disposition Plan: home in AM   Consultants:  [facility]  Procedures:  EGD  Antibiotics:    HPI/Subjective: Had an EGD done 4 months ago by Dr. Andrey Campanile Eating clears-ready for more food  Objective: Filed Vitals:   06/19/13 1320 06/19/13 1356 06/19/13 2145 06/20/13 0500  BP: 154/73 159/72 156/79 151/68  Pulse:  81 86 71  Temp:  97.7 F (36.5 C) 97.6 F (36.4 C) 97.4 F (36.3 C)  TempSrc:  Oral Oral Oral  Resp: 19 17 18 18   Height:      Weight:    71.4 kg (157 lb 6.5 oz)  SpO2: 95% 81% 94% 100%    Intake/Output Summary (Last 24 hours) at 06/20/13 1231 Last data filed at 06/20/13 0500  Gross per 24 hour  Intake      0 ml  Output   1150 ml  Net  -1150 ml   Filed Weights   06/18/13 0445 06/20/13 0500  Weight: 67.2 kg (148 lb 2.4 oz) 71.4 kg (157 lb 6.5 oz)    Exam:   General:  A+Ox3, NAD  Cardiovascular: rrr  Respiratory: clear anterior  Abdomen: +BS, soft  Musculoskeletal: moves all 4 ext  Data  Reviewed: Basic Metabolic Panel:  Recent Labs Lab 06/17/13 2136 06/18/13 0840 06/19/13 0545 06/20/13 0505  NA 127* 127* 134* 135  K 3.3* 3.2* 3.3* 3.7  CL 93* 95* 101 101  CO2 23 21 20 26   GLUCOSE 117* 129* 94 91  BUN 33* 25* 15 7  CREATININE 0.75 0.64 0.51 0.50  CALCIUM 8.2* 8.2* 8.4 8.6  MG  --  1.9  --   --    Liver Function Tests:  Recent Labs Lab 06/17/13 2136 06/18/13 0840  AST 17 10  ALT 17 15  ALKPHOS 117 112  BILITOT 0.2* 0.5  PROT 5.1* 4.9*  ALBUMIN 2.4* 2.4*    Recent Labs Lab 06/17/13 2136  LIPASE 6*   No results found for this basename: AMMONIA,  in the last 168 hours CBC:  Recent Labs Lab 06/17/13 2136 06/17/13 2343 06/18/13 1552 06/18/13 2130 06/19/13 0545 06/20/13 0505  WBC 5.5 5.2 5.7 6.2 5.4 6.2  NEUTROABS 3.9 3.5  --   --   --   --   HGB 6.7* 6.4* 10.7* 9.0* 11.1* 10.7*  HCT 18.7* 17.8* 30.6* 25.6* 31.5* 30.6*  MCV 89.5 89.4 88.2 88.0 87.3 88.7  PLT 298 275 279 263 263 291   Cardiac Enzymes: No results found for this basename: CKTOTAL, CKMB, CKMBINDEX, TROPONINI,  in the last 168  hours BNP (last 3 results) No results found for this basename: PROBNP,  in the last 8760 hours CBG:  Recent Labs Lab 06/19/13 2005 06/19/13 2356 06/20/13 0351 06/20/13 0847 06/20/13 1210  GLUCAP 202* 124* 75 93 141*    No results found for this or any previous visit (from the past 240 hour(s)).   Studies: Ct Abdomen W Contrast  06/20/2013   *RADIOLOGY REPORT*  Clinical Data: Severe duodenal ulcers, rule out perforation  CT ABDOMEN WITH CONTRAST  Technique:  Multidetector CT imaging of the abdomen was performed following the standard protocol during bolus administration of intravenous contrast.  Contrast: 80mL OMNIPAQUE IOHEXOL 300 MG/ML  SOLN 80 ml  Comparison: None.  Findings: There are tiny bilateral pleural effusions.  There is mild bibasilar atelectasis.  There are no acute musculoskeletal findings.  The liver, spleen, and adrenal glands are  normal.  There are a few tiny punctate nonobstructing bilateral renal calculi.  The aorta is calcified.  There is calcification throughout the pancreas suggesting chronic pancreatitis.  There is enhancing of the gallbladder wall of the gallbladder appears otherwise normal.  In the caudate lobe of the liver, there is a 812 mm low attenuation oval structure with average attenuation value of 13.  There is severe wall thickening involving the postbulb are duodenum down to the junction of the descending and horizontal portions of the duodenum.  There is mild surrounding inflammatory change.  There is no free air or free fluid in the abdomen.  There are numerous mildly enlarged left upper quadrant mesenteric lymph nodes.  IMPRESSION: Consistent with clinical history, there is severe inflammatory change involving the duodenum.  There is no evidence of perforation.  Enhancement of the gallbladder wall.  This is a nonspecific finding but does not necessarily indicate cholecystitis.  This may be secondary to the adjacent duodenal inflammatory process.  Tiny nonobstructing renal stones.  12 mm indeterminate caudate lobe hepatic lesion possibly a cyst or hemangioma but which is too small to characterize on this examination, and would require dedicated liver protocol CT or preferably MRI to do so.  Evidence of chronic pancreatitis.   Original Report Authenticated By: Esperanza Heir, M.D.    Scheduled Meds: . antiseptic oral rinse  15 mL Mouth Rinse q12n4p  . chlorhexidine  15 mL Mouth Rinse BID  . folic acid  1 mg Oral Daily  . insulin aspart  0-9 Units Subcutaneous Q4H  . insulin NPH  6 Units Subcutaneous BID  . pantoprazole  40 mg Oral BID  . sucralfate  1 g Oral TID WC & HS  . thiamine  100 mg Oral Daily   Continuous Infusions:    Principal Problem:   Anemia Active Problems:   Hyponatremia   ETOH abuse   Diabetes mellitus   Low back pain   Hypertension   Hypokalemia    Time spent: 35  min    Telesforo Brosnahan  [facility] Pager [phone]. If 7PM-7AM, please contact night-coverage at [email or web address], password [facility] 06/20/2013, 12:31 PM  LOS: 3 days

## 2013-06-20 NOTE — Progress Notes (Signed)
No BM for 24 hrs.     Hgb 11.1 to 10.7, but BUN has dropped from 15 to 7 during the same interval.   Tolerated clr liq diet w/out n/v/pain  For solid food today.  CT neg for evid of perforation.  IMPR:  Quiescent UGIB from extnesive duod ulceration  PLAN:  Await gastric bx's, assess tolerance of solid food, follow cbc, consider dischg tomorrow.  Florencia Reasons, M.D. [phone]

## 2013-06-21 DIAGNOSIS — K279 Peptic ulcer, site unspecified, unspecified as acute or chronic, without hemorrhage or perforation: Secondary | ICD-10-CM | POA: Diagnosis present

## 2013-06-21 DIAGNOSIS — D649 Anemia, unspecified: Secondary | ICD-10-CM

## 2013-06-21 DIAGNOSIS — K922 Gastrointestinal hemorrhage, unspecified: Secondary | ICD-10-CM | POA: Diagnosis present

## 2013-06-21 DIAGNOSIS — E119 Type 2 diabetes mellitus without complications: Secondary | ICD-10-CM

## 2013-06-21 LAB — GLUCOSE, CAPILLARY
Glucose-Capillary: 103 mg/dL — ABNORMAL HIGH (ref 70–99)
Glucose-Capillary: 159 mg/dL — ABNORMAL HIGH (ref 70–99)
Glucose-Capillary: 85 mg/dL (ref 70–99)

## 2013-06-21 LAB — CBC
HCT: 26.8 % — ABNORMAL LOW (ref 39.0–52.0)
Hemoglobin: 9.3 g/dL — ABNORMAL LOW (ref 13.0–17.0)
MCH: 30.6 pg (ref 26.0–34.0)
MCV: 88.2 fL (ref 78.0–100.0)
RBC: 3.04 MIL/uL — ABNORMAL LOW (ref 4.22–5.81)

## 2013-06-21 MED ORDER — THIAMINE HCL 100 MG PO TABS: 100.0000 mg | ORAL_TABLET | Freq: Every day | ORAL | Status: AC

## 2013-06-21 MED ORDER — FOLIC ACID 1 MG PO TABS: 1.0000 mg | ORAL_TABLET | Freq: Every day | ORAL | Status: DC

## 2013-06-21 MED ORDER — PANTOPRAZOLE SODIUM 40 MG PO TBEC: 40.0000 mg | DELAYED_RELEASE_TABLET | Freq: Two times a day (BID) | ORAL | Status: DC

## 2013-06-21 MED ORDER — SUCRALFATE 1 GM/10ML PO SUSP: 1.0000 g | Freq: Three times a day (TID) | ORAL | Status: DC

## 2013-06-21 NOTE — Discharge Summary (Signed)
Physician Discharge Summary  Patient ID: Arieh Bogue MRN: [id number] DOB/AGE: 1951/10/16 62 y.o.  Admit date: 06/17/2013 Discharge date: 06/21/2013  Primary Care Physician:  Cain Saupe, MD  Discharge Diagnoses:    . Anemia . Low back pain . Hypertension . Diabetes mellitus . Hyponatremia . ETOH abuse . Hypokalemia . PUD (peptic ulcer disease) . GI bleed  Consults:  Gastroenterology, Dr. Marjorie Smolder  Recommendations for Outpatient Follow-up:  Patient was recommended to stop aspirin, smoking, NSAIDs  He will likely need repeat EGD per gastroenterology outpatient in 4-6 weeks   Allergies:   Allergies  Allergen Reactions  . Tetanus Toxoids     rash     Discharge Medications:   Medication List    STOP taking these medications       insulin regular 100 units/mL injection  Commonly known as:  NOVOLIN R,HUMULIN R      TAKE these medications       cloNIDine 0.1 MG tablet  Commonly known as:  CATAPRES  Take 1 tablet (0.1 mg total) by mouth 3 (three) times daily.     folic acid 1 MG tablet  Commonly known as:  FOLVITE  Take 1 tablet (1 mg total) by mouth daily.     insulin NPH 100 UNIT/ML injection  Commonly known as:  HUMULIN N,NOVOLIN N  Inject 6 Units into the skin 2 (two) times daily.     multivitamin with minerals Tabs  Take 1 tablet by mouth daily.     pantoprazole 40 MG tablet  Commonly known as:  PROTONIX  Take 1 tablet (40 mg total) by mouth 2 (two) times daily.     sucralfate 1 GM/10ML suspension  Commonly known as:  CARAFATE  Take 10 mLs (1 g total) by mouth 4 (four) times daily -  with meals and at bedtime.     thiamine 100 MG tablet  Take 1 tablet (100 mg total) by mouth daily.     zolpidem 5 MG tablet  Commonly known as:  AMBIEN  Take 5 mg by mouth at bedtime as needed for sleep.         Brief H and P: For complete details please refer to admission H and P, but in brief Amario Longmore is a 62 y.o. Caucasian male with history of hypertension,  diabetes, pancreatitis, and alcohol abuse who presents with the above complaints. Patient reported that his symptoms started one week ago when he noted low back pain which since then has been getting worse. Denied any recent falls or injuries. He has had history of pancreatitis, he feels his pain is similar to his pancreatitis pain, as a result he presented to the emergency department for further evaluation. In the emergency department patient was found to be anemic with hemoglobin of 6.4. Hospitalist service was asked to admit the patient for further care and management. He does admit over the last month he has noted his stools being more black. Of note patient has a history of heavy alcohol use drinking 1-1/2 bottles of wine daily.   Hospital Course:   Anemia due to presumed GI bleed : Hemoglobin was 6.4 at the time of admission, patient was transfused. GI was consulted and he underwent endoscopy. The patient was found to have extensive duodenal ulceration possibly as a consequence of his aspirin usage and cigarette smoking. Otherwise no evidence of portal hypertension, portal gastropathy or varices despite his alcohol use. CT scan of the abdomen was done which was negative for any perforation. Patient  was placed on BID Protonix and sucralfate. He is tolerating regular diet. He will likely have repeat EGD at later date to ensure healing. He has an appointment with Dr. Marjorie Smolder on 8/12/10/12.  Diabetes uncontrolled with complications  Continue insulin  Alcohol abuse the patient was strongly recommended alcohol cessation and placed on thiamine, folic acid.:   Low back pain Suspect is likely musculoskeletal. lumbar x-ray with no abnormalities.   Hyponatremia  Suspect is likely due to chronic alcohol use. improved    Day of Discharge BP 130/66  Pulse 72  Temp(Src) 98.2 F (36.8 C) (Oral)  Resp 20  Ht 5\' 10"  (1.778 m)  Wt 71.4 kg (157 lb 6.5 oz)  BMI 22.59 kg/m2  SpO2 100%  Physical  Exam: General: Alert and awake oriented x3 not in any acute distress. CVS: S1-S2 clear no murmur rubs or gallops Chest: clear to auscultation bilaterally, no wheezing rales or rhonchi Abdomen: soft nontender, nondistended, normal bowel sounds Extremities: no cyanosis, clubbing or edema noted bilaterally Neuro: Cranial nerves II-XII intact, no focal neurological deficits   The results of significant diagnostics from this hospitalization (including imaging, microbiology, ancillary and laboratory) are listed below for reference.    LAB RESULTS: Basic Metabolic Panel:  Recent Labs Lab 06/18/13 0840 06/19/13 0545 06/20/13 0505  NA 127* 134* 135  K 3.2* 3.3* 3.7  CL 95* 101 101  CO2 21 20 26   GLUCOSE 129* 94 91  BUN 25* 15 7  CREATININE 0.64 0.51 0.50  CALCIUM 8.2* 8.4 8.6  MG 1.9  --   --    Liver Function Tests:  Recent Labs Lab 06/17/13 2136 06/18/13 0840  AST 17 10  ALT 17 15  ALKPHOS 117 112  BILITOT 0.2* 0.5  PROT 5.1* 4.9*  ALBUMIN 2.4* 2.4*    Recent Labs Lab 06/17/13 2136  LIPASE 6*   No results found for this basename: AMMONIA,  in the last 168 hours CBC:  Recent Labs Lab 06/17/13 2343  06/20/13 0505 06/21/13 0535  WBC 5.2  < > 6.2 4.1  NEUTROABS 3.5  --   --   --   HGB 6.4*  < > 10.7* 9.3*  HCT 17.8*  < > 30.6* 26.8*  MCV 89.4  < > 88.7 88.2  PLT 275  < > 291 263  < > = values in this interval not displayed. Cardiac Enzymes: No results found for this basename: CKTOTAL, CKMB, CKMBINDEX, TROPONINI,  in the last 168 hours BNP: No components found with this basename: POCBNP,  CBG:  Recent Labs Lab 06/21/13 0431 06/21/13 0823  GLUCAP 85 103*    Significant Diagnostic Studies:  Dg Lumbar Spine Complete  06/18/2013   *RADIOLOGY REPORT*  Clinical Data: Lower back pain.  LUMBAR SPINE - COMPLETE 4+ VIEW  Comparison: None.  Findings: There is no evidence of fracture or subluxation. Vertebral bodies demonstrate normal height and alignment.  Intervertebral disc spaces are preserved.  The visualized neural foramina are grossly unremarkable in appearance.  The visualized bowel gas pattern is unremarkable in appearance; air and stool are noted within the colon.  The sacroiliac joints are within normal limits.  Scattered vascular calcifications are seen.  IMPRESSION: No evidence of fracture or subluxation along the lumbar spine.   Original Report Authenticated By: Tonia Ghent, M.D.    2D ECHO:   Disposition and Follow-up:     Discharge Orders   Future Orders Complete By Expires     Diet Carb Modified  As directed  Discharge instructions  As directed     Comments:      Please stop smoking, Aspirin or any NSAIDS (alleve, ibuprofen).    Increase activity slowly  As directed         DISPOSITION: Home  DIET: Carb modified diet  ACTIVITY: As tolerated    DISCHARGE FOLLOW-UP Follow-up Information   Follow up with Florencia Reasons, MD On 07/20/2013. (at 3:45PM)    Contact information:   [address]                         [place] [address] [phone]       Follow up with FULP, CAMMIE, MD. Schedule an appointment as soon as possible for a visit in 2 weeks. (for hospital follow-up)    Contact information:   [address] [place], [place] [address] [place] [address] [phone]       Time spent on Discharge: 40 minutes   Signed:   Lot Medford M.D. [facility] 06/21/2013, 10:17 AM Pager: [phone]

## 2013-06-21 NOTE — Care Management Note (Signed)
  Page 1 of 1   06/21/2013     2:56:36 PM   CARE MANAGEMENT NOTE 06/21/2013  Patient:  Benjamin [name], Benjamin [name]   Account Number:  [id number]  Date Initiated:  06/21/2013  Documentation initiated by:  Ronny Flurry  Subjective/Objective Assessment:     Action/Plan:   Anticipated DC Date:  06/21/2013   Anticipated DC Plan:           Choice offered to / List presented to:             Status of service:   Medicare Important Message given?   (If response is "NO", the following Medicare IM given date fields will be blank) Date Medicare IM given:   Date Additional Medicare IM given:    Discharge Disposition:    Per UR Regulation:    If discussed at Long Length of Stay Meetings, dates discussed:    Comments:  06-21-13 PT recommending SNF . Spoke to patient . Patient states he cannot afford SNF and states that he wants to go home . Patient does live alone , but his brother lives close by and he has several neighbors that are available if he needs anything.  He states he was hospitalized before and recommended to go to SNF , however he chose not to and he got stronger on his own.  Offered to set up home health , provided list of agencies , however patient declined . Explained to patient he could pick a home health agency  , I would make referral and than agency would " run his insurance " to find co pay . Patient declined . Patient wants to go home with no home health .  Paged attending  Ronny Flurry RN BSN [phone]

## 2013-06-21 NOTE — Evaluation (Signed)
Physical Therapy Evaluation Patient Details Name: Benjamin [name] MRN: [id number] DOB: 04-23-1951 Today's Date: 06/21/2013 Time: 7829-5621 PT Time Calculation (min): 26 min  PT Assessment / Plan / Recommendation History of Present Illness  Benjamin [name] is a 62 y.o. Caucasian male with history of hypertension, diabetes, pancreatitis, and alcohol abuse who presents with the above complaints.  Patient reports that his symptoms started one week ago when he noted low back pain which since then has been getting worse.  Denies any recent falls or injuries.  He has had history of pancreatitis, he feels his pain is similar to his pancreatitis pain, as a result he presented to the emergency department for further evaluation.  In the emergency department patient was found to be anemic with hemoglobin of 6.4.  Hospitalist service was asked to admit the patient for further care and management  Pt underwent EGD and found heavy ulceration in bulbar portion of the duodenum.  Clinical Impression  Pt admitted with back pain, but determined to have extremely low Hgb.  EGD showed duodenal ulcerations that have been managed. Pt currently with functional limitations due to the deficits listed below (see PT Problem List). He also really doesn't have anyone close by that he can count on consistently.   Pt will benefit from skilled PT to increase their independence and safety with mobility to allow discharge to the venue listed below.      PT Assessment  Patient needs continued PT services    Follow Up Recommendations  SNF;Other (comment) (short- term for rehab)    Does the patient have the potential to tolerate intense rehabilitation      Barriers to Discharge Decreased caregiver support      Equipment Recommendations  Rolling walker with 5" wheels    Recommendations for Other Services     Frequency Min 3X/week    Precautions / Restrictions Precautions Precautions: Fall Restrictions Weight Bearing Restrictions: No    Pertinent Vitals/Pain       Mobility  Bed Mobility Bed Mobility: Not assessed Transfers Transfers: Sit to Stand;Stand to Sit Sit to Stand: With upper extremity assist;From chair/3-in-1;4: Min guard Stand to Sit: To chair/3-in-1;4: Min guard Details for Transfer Assistance: guarded, but no assist needed Ambulation/Gait Ambulation/Gait Assistance: 4: Min guard Ambulation Distance (Feet): 100 Feet Assistive device: None Ambulation/Gait Assistance Details: mildly unsteady wide BOS, decrease hip flexion Gait Pattern: Step-through pattern;Wide base of support;Decreased hip/knee flexion - right;Decreased hip/knee flexion - left Gait velocity: slower Stairs: Yes Stairs Assistance: 4: Min assist Stairs Assistance Details (indicate cue type and reason): needs assist on stairs for safety, but does not have any steps that he must negotiate daily. Stair Management Technique: One rail Right;Step to pattern;Forwards Number of Stairs: 3 Wheelchair Mobility Wheelchair Mobility: No    Exercises     PT Diagnosis: Generalized weakness;Difficulty walking  PT Problem List: Decreased strength;Decreased activity tolerance;Decreased balance;Decreased mobility;Decreased knowledge of use of DME PT Treatment Interventions: Gait training;Functional mobility training;Therapeutic activities;Balance training;Patient/family education     PT Goals(Current goals can be found in the care plan section) Acute Rehab PT Goals Patient Stated Goal: Ultimately to get back home PT Goal Formulation: With patient Time For Goal Achievement: 06/28/13 Potential to Achieve Goals: Good  Visit Information  Last PT Received On: 06/21/13 Assistance Needed: +1 History of Present Illness: Benjamin [name] is a 62 y.o. Caucasian male with history of hypertension, diabetes, pancreatitis, and alcohol abuse who presents with the above complaints.  Patient reports that his symptoms started one week  ago when he noted low back pain  which since then has been getting worse.  Denies any recent falls or injuries.  He has had history of pancreatitis, he feels his pain is similar to his pancreatitis pain, as a result he presented to the emergency department for further evaluation.  In the emergency department patient was found to be anemic with hemoglobin of 6.4.  Hospitalist service was asked to admit the patient for further care and management  Pt underwent EGD and found heavy ulceration in bulbar portion of the duodenum.       Prior Functioning  Home Living Family/patient expects to be discharged to:: Private residence Living Arrangements: Alone Available Help at Discharge: Family;Available PRN/intermittently Type of Home: House Home Access: Level entry Home Layout: One level Home Equipment: Walker - standard (access to RW) Prior Function Level of Independence: Independent Communication Communication: No difficulties    Cognition  Cognition Arousal/Alertness: Awake/alert Behavior During Therapy: WFL for tasks assessed/performed Overall Cognitive Status: Within Functional Limits for tasks assessed    Extremity/Trunk Assessment Upper Extremity Assessment Upper Extremity Assessment: Generalized weakness Lower Extremity Assessment Lower Extremity Assessment: Generalized weakness (bil hip flexors ar 3- to 3/5, stronger distally)   Balance Balance Balance Assessed: Yes  End of Session PT - End of Session Activity Tolerance: Patient tolerated treatment well Patient left: in chair;with call bell/phone within reach Nurse Communication: Mobility status  GP     Benjamin [name], Eliseo Gum 06/21/2013, 12:47 PM 06/21/2013  [place] Bing, PT [phone]  (pager)

## 2013-06-21 NOTE — Progress Notes (Signed)
Just noted that the patient was seen by physical therapy is recommended skilled nursing facility, updated social worker to expedite disposition. Will cancel DC home.   Treavon Castilleja M.D. [facility] 06/21/2013, 12:56 PM  Pager: [phone]

## 2013-06-21 NOTE — Clinical Social Work Note (Signed)
Clinical Social Worker received referral for SNF placement. CSW reviewed chart and patient is planned to go home. CSW signing off.   Rozetta Nunnery MSW, [employer] [phone]

## 2014-01-03 ENCOUNTER — Other Ambulatory Visit: Payer: Self-pay | Admitting: *Deleted

## 2014-01-03 ENCOUNTER — Encounter: Payer: Self-pay | Admitting: Vascular Surgery

## 2014-01-03 DIAGNOSIS — E1159 Type 2 diabetes mellitus with other circulatory complications: Secondary | ICD-10-CM

## 2014-01-03 DIAGNOSIS — R0989 Other specified symptoms and signs involving the circulatory and respiratory systems: Secondary | ICD-10-CM

## 2014-01-24 ENCOUNTER — Encounter: Payer: Self-pay | Admitting: Vascular Surgery

## 2014-01-25 ENCOUNTER — Encounter (HOSPITAL_COMMUNITY): Payer: BC Managed Care – PPO

## 2014-01-25 ENCOUNTER — Encounter: Payer: BC Managed Care – PPO | Admitting: Vascular Surgery

## 2014-01-28 ENCOUNTER — Encounter (HOSPITAL_COMMUNITY): Payer: Self-pay | Admitting: Emergency Medicine

## 2014-01-28 ENCOUNTER — Emergency Department (HOSPITAL_COMMUNITY): Payer: BC Managed Care – PPO

## 2014-01-28 ENCOUNTER — Inpatient Hospital Stay (HOSPITAL_COMMUNITY)
Admission: EM | Admit: 2014-01-28 | Discharge: 2014-02-11 | DRG: 981 | Disposition: A | Discharge status: Skilled Nursing Facility | Payer: BC Managed Care – PPO | Attending: Internal Medicine | Admitting: Internal Medicine

## 2014-01-28 DIAGNOSIS — D500 Iron deficiency anemia secondary to blood loss (chronic): Secondary | ICD-10-CM | POA: Diagnosis present

## 2014-01-28 DIAGNOSIS — E119 Type 2 diabetes mellitus without complications: Secondary | ICD-10-CM | POA: Diagnosis present

## 2014-01-28 DIAGNOSIS — E1110 Type 2 diabetes mellitus with ketoacidosis without coma: Secondary | ICD-10-CM | POA: Diagnosis present

## 2014-01-28 DIAGNOSIS — E860 Dehydration: Secondary | ICD-10-CM | POA: Diagnosis present

## 2014-01-28 DIAGNOSIS — M6282 Rhabdomyolysis: Secondary | ICD-10-CM | POA: Diagnosis present

## 2014-01-28 DIAGNOSIS — F17200 Nicotine dependence, unspecified, uncomplicated: Secondary | ICD-10-CM | POA: Diagnosis present

## 2014-01-28 DIAGNOSIS — E1310 Other specified diabetes mellitus with ketoacidosis without coma: Principal | ICD-10-CM | POA: Diagnosis present

## 2014-01-28 DIAGNOSIS — I959 Hypotension, unspecified: Secondary | ICD-10-CM

## 2014-01-28 DIAGNOSIS — I10 Essential (primary) hypertension: Secondary | ICD-10-CM | POA: Diagnosis present

## 2014-01-28 DIAGNOSIS — T68XXXA Hypothermia, initial encounter: Secondary | ICD-10-CM

## 2014-01-28 DIAGNOSIS — G9340 Encephalopathy, unspecified: Secondary | ICD-10-CM

## 2014-01-28 DIAGNOSIS — W06XXXA Fall from bed, initial encounter: Secondary | ICD-10-CM | POA: Diagnosis present

## 2014-01-28 DIAGNOSIS — K279 Peptic ulcer, site unspecified, unspecified as acute or chronic, without hemorrhage or perforation: Secondary | ICD-10-CM | POA: Diagnosis present

## 2014-01-28 DIAGNOSIS — E873 Alkalosis: Secondary | ICD-10-CM | POA: Diagnosis present

## 2014-01-28 DIAGNOSIS — S42213A Unspecified displaced fracture of surgical neck of unspecified humerus, initial encounter for closed fracture: Secondary | ICD-10-CM

## 2014-01-28 DIAGNOSIS — S42309A Unspecified fracture of shaft of humerus, unspecified arm, initial encounter for closed fracture: Secondary | ICD-10-CM

## 2014-01-28 DIAGNOSIS — K859 Acute pancreatitis without necrosis or infection, unspecified: Secondary | ICD-10-CM | POA: Diagnosis not present

## 2014-01-28 DIAGNOSIS — Y92009 Unspecified place in unspecified non-institutional (private) residence as the place of occurrence of the external cause: Secondary | ICD-10-CM

## 2014-01-28 DIAGNOSIS — F1010 Alcohol abuse, uncomplicated: Secondary | ICD-10-CM | POA: Diagnosis present

## 2014-01-28 DIAGNOSIS — F10939 Alcohol use, unspecified with withdrawal, unspecified: Secondary | ICD-10-CM | POA: Diagnosis present

## 2014-01-28 DIAGNOSIS — Z833 Family history of diabetes mellitus: Secondary | ICD-10-CM

## 2014-01-28 DIAGNOSIS — J690 Pneumonitis due to inhalation of food and vomit: Secondary | ICD-10-CM

## 2014-01-28 DIAGNOSIS — E872 Acidosis, unspecified: Secondary | ICD-10-CM | POA: Diagnosis present

## 2014-01-28 DIAGNOSIS — D649 Anemia, unspecified: Secondary | ICD-10-CM

## 2014-01-28 DIAGNOSIS — N390 Urinary tract infection, site not specified: Secondary | ICD-10-CM | POA: Diagnosis present

## 2014-01-28 DIAGNOSIS — F10121 Alcohol abuse with intoxication delirium: Secondary | ICD-10-CM

## 2014-01-28 DIAGNOSIS — F10239 Alcohol dependence with withdrawal, unspecified: Secondary | ICD-10-CM | POA: Diagnosis present

## 2014-01-28 DIAGNOSIS — Z794 Long term (current) use of insulin: Secondary | ICD-10-CM

## 2014-01-28 DIAGNOSIS — F10229 Alcohol dependence with intoxication, unspecified: Secondary | ICD-10-CM | POA: Diagnosis present

## 2014-01-28 LAB — LIPASE, BLOOD: Lipase: 238 U/L — ABNORMAL HIGH (ref 11–59)

## 2014-01-28 LAB — ETHANOL: Alcohol, Ethyl (B): <11 mg/dL (ref 0–11)

## 2014-01-28 LAB — URINALYSIS, ROUTINE W REFLEX MICROSCOPIC
Glucose, UA: >1000 mg/dL — AB
Hgb urine dipstick: NEGATIVE
Ketones, ur: >80 mg/dL — AB
Leukocytes, UA: NEGATIVE
Nitrite: NEGATIVE
Protein, ur: 100 mg/dL — AB
Specific Gravity, Urine: 1.022 (ref 1.005–1.030)
Urobilinogen, UA: 1 mg/dL (ref 0.0–1.0)
pH: 6 (ref 5.0–8.0)

## 2014-01-28 LAB — COMPREHENSIVE METABOLIC PANEL
ALT: 172 U/L — ABNORMAL HIGH (ref 0–53)
AST: 359 U/L — ABNORMAL HIGH (ref 0–37)
Albumin: 2.5 g/dL — ABNORMAL LOW (ref 3.5–5.2)
Alkaline Phosphatase: 436 U/L — ABNORMAL HIGH (ref 39–117)
Chloride: 89 mEq/L — ABNORMAL LOW (ref 96–112)
Creatinine, Ser: 1.78 mg/dL — ABNORMAL HIGH (ref 0.50–1.35)
GFR calc Af Amer: 46 mL/min — ABNORMAL LOW (ref >90)
GFR calc non Af Amer: 39 mL/min — ABNORMAL LOW (ref >90)
Glucose, Bld: 485 mg/dL — ABNORMAL HIGH (ref 70–99)
Potassium: 5.6 mEq/L — ABNORMAL HIGH (ref 3.7–5.3)
Sodium: 139 mEq/L (ref 137–147)
Total Bilirubin: 1 mg/dL (ref 0.3–1.2)

## 2014-01-28 LAB — CBC
HCT: 27.5 % — ABNORMAL LOW (ref 39.0–52.0)
Hemoglobin: 9 g/dL — ABNORMAL LOW (ref 13.0–17.0)
MCH: 32.4 pg (ref 26.0–34.0)
MCHC: 32.7 g/dL (ref 30.0–36.0)
MCV: 98.9 fL (ref 78.0–100.0)
Platelets: 252 K/uL (ref 150–400)
RBC: 2.78 MIL/uL — ABNORMAL LOW (ref 4.22–5.81)
RDW: 14.2 % (ref 11.5–15.5)
WBC: 6.1 10*3/uL (ref 4.0–10.5)

## 2014-01-28 LAB — COMPREHENSIVE METABOLIC PANEL WITH GFR
BUN: 64 mg/dL — ABNORMAL HIGH (ref 6–23)
CO2: <7 meq/L — CL (ref 19–32)
Calcium: 8.5 mg/dL (ref 8.4–10.5)
Total Protein: 5.6 g/dL — ABNORMAL LOW (ref 6.0–8.3)

## 2014-01-28 LAB — I-STAT CG4 LACTIC ACID, ED: Lactic Acid, Venous: 2.26 mmol/L — ABNORMAL HIGH (ref 0.5–2.2)

## 2014-01-28 LAB — URINE MICROSCOPIC-ADD ON

## 2014-01-28 LAB — CBG MONITORING, ED: Glucose-Capillary: 463 mg/dL — ABNORMAL HIGH (ref 70–99)

## 2014-01-28 LAB — CK: Total CK: 481 U/L — ABNORMAL HIGH (ref 7–232)

## 2014-01-28 MED ORDER — VITAMIN B-1 100 MG PO TABS
100.0000 mg | ORAL_TABLET | Freq: Every day | ORAL | Status: DC
  Administered 2014-01-29 – 2014-02-11 (×13): 100 mg via ORAL
  Filled 2014-01-28 (×14): qty 1

## 2014-01-28 MED ORDER — LORAZEPAM 2 MG/ML IJ SOLN
0.0000 mg | Freq: Four times a day (QID) | INTRAMUSCULAR | Status: DC
  Administered 2014-01-29 (×2): 1 mg via INTRAVENOUS
  Filled 2014-01-28 (×2): qty 1

## 2014-01-28 MED ORDER — LORAZEPAM 1 MG PO TABS
0.0000 mg | ORAL_TABLET | Freq: Four times a day (QID) | ORAL | Status: DC
  Administered 2014-01-29: 1 mg via ORAL

## 2014-01-28 MED ORDER — SODIUM CHLORIDE 0.9 % IV SOLN
INTRAVENOUS | Status: DC
  Administered 2014-01-29: 3.3 [IU]/h via INTRAVENOUS
  Filled 2014-01-28: qty 1

## 2014-01-28 MED ORDER — THIAMINE HCL 100 MG/ML IJ SOLN
100.0000 mg | Freq: Every day | INTRAMUSCULAR | Status: DC
  Filled 2014-01-28 (×5): qty 1

## 2014-01-28 MED ORDER — SODIUM CHLORIDE 0.9 % IV SOLN
1000.0000 mL | Freq: Once | INTRAVENOUS | Status: AC
  Administered 2014-01-29: 1000 mL via INTRAVENOUS

## 2014-01-28 MED ORDER — SODIUM CHLORIDE 0.9 % IV SOLN
1000.0000 mL | INTRAVENOUS | Status: DC
  Administered 2014-01-29: 1000 mL via INTRAVENOUS

## 2014-01-28 MED ORDER — SODIUM CHLORIDE 0.9 % IV SOLN
1000.0000 mL | Freq: Once | INTRAVENOUS | Status: AC
  Administered 2014-01-28: 1000 mL via INTRAVENOUS

## 2014-01-28 MED ORDER — FENTANYL CITRATE 0.05 MG/ML IJ SOLN
25.0000 ug | Freq: Once | INTRAMUSCULAR | Status: AC
  Administered 2014-01-28: 25 ug via INTRAVENOUS
  Filled 2014-01-28: qty 2

## 2014-01-28 MED ORDER — SODIUM CHLORIDE 0.9 % IV BOLUS (SEPSIS)
1000.0000 mL | Freq: Once | INTRAVENOUS | Status: AC
  Administered 2014-01-28: 1000 mL via INTRAVENOUS

## 2014-01-28 MED ORDER — DEXTROSE-NACL 5-0.45 % IV SOLN
INTRAVENOUS | Status: DC
  Administered 2014-01-29: 06:00:00 via INTRAVENOUS

## 2014-01-28 MED ORDER — LORAZEPAM 2 MG/ML IJ SOLN: 0.0000 mg | Freq: Two times a day (BID) | INTRAMUSCULAR | Status: DC

## 2014-01-28 MED ORDER — LORAZEPAM 1 MG PO TABS
0.0000 mg | ORAL_TABLET | Freq: Two times a day (BID) | ORAL | Status: DC
Start: 2014-01-29 — End: 2014-01-29
  Filled 2014-01-28: qty 1

## 2014-01-28 NOTE — ED Notes (Addendum)
Dr. Dorna Mai notified of CO <7.

## 2014-01-28 NOTE — ED Notes (Signed)
[phone] [place]) [phone] [facility])

## 2014-01-28 NOTE — ED Notes (Signed)
Pt BIB EMS. Pt's family called EMS for a welfare check on pt. Pt was found by EMS lying in the floor of his home covered in urine and feces. Pt states he has been drinking heavily and his arms and legs hurt and he wasn't able to get up. Pt had blood glucose of 484 mg/dl per EMS. Pt arrives alert, no acute distress.

## 2014-01-28 NOTE — ED Notes (Signed)
Bair hugger applied. Report to [employer], Therapist, sports. Pt moved to Res A.

## 2014-01-28 NOTE — ED Notes (Signed)
Bed: WA08 Expected date:  Expected time:  Means of arrival:  Comments: EMS/intoxicated with BS 484

## 2014-01-28 NOTE — ED Provider Notes (Addendum)
CSN: [id number]     Arrival date & time 01/28/14  2000 History   First MD Initiated Contact with Patient 01/28/14 2119     Chief Complaint  Patient presents with  . Alcohol Intoxication  . Hyperglycemia     (Consider location/radiation/quality/duration/timing/severity/associated sxs/prior Treatment) HPI Comments: Pt with severe alcoholism, brother calls daily, hadn't heard from brother in 3 days, sent someone to check on pt as a welfare.  Pt was found on the floor, unable to stand, has left shoulder pain and tells me he fell out of bed.  Pt admits to drinking heavily like usual.  Pt denies N/V.  Pt is unclear how long he was on the floor.  He is confused to date.  He denies current CP, SOB, back pain, neck pain.    Patient is a 63 y.o. male presenting with intoxication and hyperglycemia. The history is provided by the patient, a relative and medical records. The history is limited by the condition of the patient.  Alcohol Intoxication  Hyperglycemia   Past Medical History  Diagnosis Date  . Diabetes mellitus   . Hypertension   . Alcohol abuse   . Pancreatitis    Past Surgical History  Procedure Laterality Date  . Knee surgery    . Pancreas surgery    . Esophagogastroduodenoscopy N/A 06/19/2013    Procedure: ESOPHAGOGASTRODUODENOSCOPY (EGD);  Surgeon: Cleotis Nipper, MD;  Location: [facility] ENDOSCOPY;  Service: Endoscopy;  Laterality: N/A;   Family History  Problem Relation Age of Onset  . Diabetes Mother   . Dementia Father    History  Substance Use Topics  . Smoking status: Current Every Day Smoker -- 1.00 packs/day for 40 years    Types: Cigarettes  . Smokeless tobacco: Never Used  . Alcohol Use: Yes    Review of Systems  Unable to perform ROS: Acuity of condition      Allergies  Tetanus toxoids  Home Medications   Current Outpatient Rx  Name  Route  Sig  Dispense  Refill  . EXPIRED: cloNIDine (CATAPRES) 0.1 MG tablet   Oral   Take 1 tablet (0.1 mg total) by  mouth 3 (three) times daily.   60 tablet   0   . folic acid (FOLVITE) 1 MG tablet   Oral   Take 1 tablet (1 mg total) by mouth daily.   30 tablet   3   . insulin NPH (HUMULIN N,NOVOLIN N) 100 UNIT/ML injection   Subcutaneous   Inject 6 Units into the skin 2 (two) times daily.         . Multiple Vitamin (MULTIVITAMIN WITH MINERALS) TABS   Oral   Take 1 tablet by mouth daily.   30 tablet   0   . pantoprazole (PROTONIX) 40 MG tablet   Oral   Take 1 tablet (40 mg total) by mouth 2 (two) times daily.   60 tablet   3   . sucralfate (CARAFATE) 1 GM/10ML suspension   Oral   Take 10 mLs (1 g total) by mouth 4 (four) times daily -  with meals and at bedtime.   420 mL   1   . thiamine 100 MG tablet   Oral   Take 1 tablet (100 mg total) by mouth daily.   30 tablet   3   . zolpidem (AMBIEN) 5 MG tablet   Oral   Take 5 mg by mouth at bedtime as needed for sleep.  BP 116/74  Pulse 99  Temp(Src) 94.3 F (34.6 C) (Core (Comment))  Resp 26  SpO2 100% Physical Exam  Nursing note and vitals reviewed. Constitutional: He appears well-developed.  rigors  HENT:  Head: Normocephalic and atraumatic.  Eyes: EOM are normal. Scleral icterus is present.  Neck: Normal range of motion. Neck supple. No JVD present.  Cardiovascular: Normal rate and regular rhythm.   Pulmonary/Chest: Accessory muscle usage present. No stridor. Tachypnea noted. No respiratory distress. He has no decreased breath sounds. He has no wheezes. He has no rales.  Abdominal: Soft.  Musculoskeletal:       Left shoulder: He exhibits decreased range of motion, tenderness, bony tenderness, swelling and deformity. He exhibits no laceration, normal pulse and normal strength.  Skin: Ecchymosis noted. He is not diaphoretic. No cyanosis.  Spider veins present    ED Course  Angiocath insertion Date/Time: 01/28/2014 11:57 PM Performed by: Donzetta Matters. Authorized by: Donzetta Matters Consent: Verbal  consent obtained. The procedure was performed in an emergent situation. Risks and benefits: risks, benefits and alternatives were discussed Consent given by: patient Patient understanding: patient states understanding of the procedure being performed Patient consent: the patient's understanding of the procedure matches consent given Patient identity confirmed: verbally with patient and arm band Time out: Immediately prior to procedure a "time out" was called to verify the correct patient, procedure, equipment, support staff and site/side marked as required. Preparation: Patient was prepped and draped in the usual sterile fashion. Local anesthesia used: no Patient sedated: no Patient tolerance: Patient tolerated the procedure well with no immediate complications. Comments: 20 gauge angiocath inserted into right EJ on 1 attempt with flash, easily flushed, blood able to be drawn back   (including critical care time)  CRITICAL CARE Performed by: Donzetta Matters Total critical care time: 35 min Critical care time was exclusive of separately billable procedures and treating other patients. Critical care was necessary to treat or prevent imminent or life-threatening deterioration. Critical care was time spent personally by me on the following activities: development of treatment plan with patient and/or surrogate as well as nursing, discussions with consultants, evaluation of patient's response to treatment, examination of patient, obtaining history from patient or surrogate, ordering and performing treatments and interventions, ordering and review of laboratory studies, ordering and review of radiographic studies, pulse oximetry and re-evaluation of patient's condition.  I attempted peripheral IV access right antecub with flash, but unsucessful times 2.   Labs Review Labs Reviewed  URINALYSIS, ROUTINE W REFLEX MICROSCOPIC - Abnormal; Notable for the following:    Glucose, UA >1000 (*)     Bilirubin Urine MODERATE (*)    Ketones, ur >80 (*)    Protein, ur 100 (*)    All other components within normal limits  CBC - Abnormal; Notable for the following:    RBC 2.78 (*)    Hemoglobin 9.0 (*)    HCT 27.5 (*)    All other components within normal limits  CK - Abnormal; Notable for the following:    Total CK 481 (*)    All other components within normal limits  COMPREHENSIVE METABOLIC PANEL - Abnormal; Notable for the following:    Potassium 5.6 (*)    Chloride 89 (*)    CO2 <7 (*)    Glucose, Bld 485 (*)    BUN 64 (*)    Creatinine, Ser 1.78 (*)    Total Protein 5.6 (*)    Albumin 2.5 (*)  AST 359 (*)    ALT 172 (*)    Alkaline Phosphatase 436 (*)    GFR calc non Af Amer 39 (*)    GFR calc Af Amer 46 (*)    All other components within normal limits  LIPASE, BLOOD - Abnormal; Notable for the following:    Lipase 238 (*)    All other components within normal limits  URINE MICROSCOPIC-ADD ON - Abnormal; Notable for the following:    Casts HYALINE CASTS (*)    All other components within normal limits  CBG MONITORING, ED - Abnormal; Notable for the following:    Glucose-Capillary 463 (*)    All other components within normal limits  I-STAT CG4 LACTIC ACID, ED - Abnormal; Notable for the following:    Lactic Acid, Venous 2.26 (*)    All other components within normal limits  ETHANOL   Imaging Review Dg Chest Port 1 View  01/28/2014   CLINICAL DATA:  Status post fall; left shoulder pain and bruising.  EXAM: PORTABLE CHEST - 1 VIEW  COMPARISON:  Left shoulder radiographs performed earlier today at 9:35 p.m.  FINDINGS: As noted on concurrent left shoulder radiographs, there is a significantly displaced fracture involving the surgical neck of the left humerus.  No additional fractures are seen.  The lungs are well-aerated. There is question of focal spiculation at the medial left lung base. There is no evidence of pleural effusion or pneumothorax. The cardiomediastinal  silhouette is within normal limits.  IMPRESSION: 1. Significantly displaced fracture involving the surgical neck of the left humerus; no additional fracture seen. 2. Question of focal spiculation at the medial left lung base; a suspicious nodule cannot be excluded. CT of the chest would be helpful for further evaluation, when and as deemed clinically appropriate.   Electronically Signed   By: Garald Balding M.D.   On: 01/28/2014 22:13   Dg Shoulder Left Port  01/28/2014   CLINICAL DATA:  Fall.  Shoulder pain and bruising.  EXAM: PORTABLE LEFT SHOULDER - 2+ VIEW  COMPARISON:  None.  FINDINGS: Prominently displaced fracture of the surgical neck, with the humeral shaft fragment displaced about 3.6 cm anteriorly and with rotated humeral head fragment. The rotation makes it difficult to count the number of parts, although there is a lateral fragment measuring about 2.7 cm in long axis adjacent to the humeral head fragment which could have arisen from the greater or lesser tuberosity.  IMPRESSION: 1. Considerably displaced surgical neck fracture of the left proximal humerus. Favor 2 or 3 parts.   Electronically Signed   By: Sherryl Barters M.D.   On: 01/28/2014 22:05    EKG Interpretation    Date/Time:  Monday January 28 2014 22:49:55 EST Ventricular Rate:  104 PR Interval:    QRS Duration: 100 QT Interval:  350 QTC Calculation: 460 R Axis:   86 Text Interpretation:  Sinus tachycardia Borderline right axis deviation Baseline wander in lead(s) V6 Poor data quality Confirmed by [facility]  MD, MICHEAL ([id number]) on 01/28/2014 11:04:11 PM           RA sat is 100% and I interpret to be normal  11:04 PM Pt's BP is improved after IVF's.     11:18 PM Pt with sig anion gap with CO2 <7, likely DKA . Lipase also elevated.  Will start IV insulin and additional IVF's for presumed DKA.  Will contact intensivist.  MDM   Final diagnoses:  Alcohol intoxication delirium  Fracture, humerus, neck  Hypotension   Hypothermia  DKA (diabetic ketoacidoses)    Pt is hypotensive, hypothermic, likely immobile on the floor for considerable time.  Dehydration, hypothermia, at risk for renal failure from rhabdo, will need IVF's, monitoring urine output, CK.  Will continue IVF's, actively warm, will need admission likely to ICU.  Pt is protecting airway.  Sling for shoulder, will need routine ortho consultation, but certainly not a surgical candidate.  Pt is intact distal pulses and neuro in left UE.      Saddie Benders. Dorna Mai, MD 01/28/14 [place] Dorna Mai, MD 01/28/14 2358

## 2014-01-29 ENCOUNTER — Inpatient Hospital Stay (HOSPITAL_COMMUNITY): Payer: BC Managed Care – PPO

## 2014-01-29 ENCOUNTER — Encounter (HOSPITAL_COMMUNITY): Payer: Self-pay | Admitting: Internal Medicine

## 2014-01-29 DIAGNOSIS — K859 Acute pancreatitis without necrosis or infection, unspecified: Secondary | ICD-10-CM | POA: Diagnosis present

## 2014-01-29 DIAGNOSIS — E872 Acidosis, unspecified: Secondary | ICD-10-CM | POA: Diagnosis present

## 2014-01-29 DIAGNOSIS — E1110 Type 2 diabetes mellitus with ketoacidosis without coma: Secondary | ICD-10-CM

## 2014-01-29 LAB — BLOOD GAS, ARTERIAL
Acid-base deficit: 4.4 mmol/L — ABNORMAL HIGH (ref 0.0–2.0)
BICARBONATE: 17.6 meq/L — AB (ref 20.0–24.0)
DRAWN BY: 103701
FIO2: 0.21 %
O2 SAT: 98.1 %
PATIENT TEMPERATURE: 98.6
PH ART: 7.503 — AB (ref 7.350–7.450)
TCO2: 16.5 mmol/L (ref 0–100)
pCO2 arterial: 22.7 mmHg — ABNORMAL LOW (ref 35.0–45.0)
pO2, Arterial: 109 mmHg — ABNORMAL HIGH (ref 80.0–100.0)

## 2014-01-29 LAB — BASIC METABOLIC PANEL
BUN: 61 mg/dL — ABNORMAL HIGH (ref 6–23)
BUN: 60 mg/dL — ABNORMAL HIGH (ref 6–23)
BUN: 54 mg/dL — ABNORMAL HIGH (ref 6–23)
BUN: 51 mg/dL — AB (ref 6–23)
BUN: 47 mg/dL — AB (ref 6–23)
BUN: 43 mg/dL — ABNORMAL HIGH (ref 6–23)
CALCIUM: 8.1 mg/dL — AB (ref 8.4–10.5)
CALCIUM: 8 mg/dL — AB (ref 8.4–10.5)
CALCIUM: 8 mg/dL — AB (ref 8.4–10.5)
CALCIUM: 8 mg/dL — AB (ref 8.4–10.5)
CO2: <7 mEq/L — CL (ref 19–32)
CO2: 18 meq/L — AB (ref 19–32)
CO2: 16 mEq/L — ABNORMAL LOW (ref 19–32)
CO2: 20 mEq/L (ref 19–32)
CO2: 20 meq/L (ref 19–32)
CO2: 20 mEq/L (ref 19–32)
CREATININE: 1.09 mg/dL (ref 0.50–1.35)
CREATININE: 0.92 mg/dL (ref 0.50–1.35)
CREATININE: 0.83 mg/dL (ref 0.50–1.35)
Calcium: 7.7 mg/dL — ABNORMAL LOW (ref 8.4–10.5)
Calcium: 8.1 mg/dL — ABNORMAL LOW (ref 8.4–10.5)
Chloride: 95 mEq/L — ABNORMAL LOW (ref 96–112)
Chloride: 104 mEq/L (ref 96–112)
Chloride: 104 mEq/L (ref 96–112)
Chloride: 107 mEq/L (ref 96–112)
Chloride: 107 mEq/L (ref 96–112)
Chloride: 107 mEq/L (ref 96–112)
Creatinine, Ser: 1.51 mg/dL — ABNORMAL HIGH (ref 0.50–1.35)
Creatinine, Ser: 1.07 mg/dL (ref 0.50–1.35)
Creatinine, Ser: 0.8 mg/dL (ref 0.50–1.35)
GFR calc Af Amer: 56 mL/min — ABNORMAL LOW (ref >90)
GFR calc Af Amer: >90 mL/min (ref >90)
GFR calc Af Amer: >90 mL/min (ref >90)
GFR calc non Af Amer: 48 mL/min — ABNORMAL LOW (ref >90)
GFR calc non Af Amer: 71 mL/min — ABNORMAL LOW (ref >90)
GFR calc non Af Amer: 73 mL/min — ABNORMAL LOW (ref >90)
GFR calc non Af Amer: >90 mL/min (ref >90)
GFR, EST AFRICAN AMERICAN: 83 mL/min — AB (ref >90)
GFR, EST AFRICAN AMERICAN: 85 mL/min — AB (ref >90)
GFR, EST NON AFRICAN AMERICAN: 89 mL/min — AB (ref >90)
GLUCOSE: 396 mg/dL — AB (ref 70–99)
GLUCOSE: 181 mg/dL — AB (ref 70–99)
GLUCOSE: 137 mg/dL — AB (ref 70–99)
Glucose, Bld: 130 mg/dL — ABNORMAL HIGH (ref 70–99)
Glucose, Bld: 125 mg/dL — ABNORMAL HIGH (ref 70–99)
Glucose, Bld: 143 mg/dL — ABNORMAL HIGH (ref 70–99)
POTASSIUM: 4.4 meq/L (ref 3.7–5.3)
POTASSIUM: 4.5 meq/L (ref 3.7–5.3)
Potassium: 4.4 mEq/L (ref 3.7–5.3)
Potassium: 4.2 mEq/L (ref 3.7–5.3)
Potassium: 4.1 mEq/L (ref 3.7–5.3)
Potassium: 3.9 mEq/L (ref 3.7–5.3)
SODIUM: 143 meq/L (ref 137–147)
Sodium: 141 mEq/L (ref 137–147)
Sodium: 143 mEq/L (ref 137–147)
Sodium: 143 mEq/L (ref 137–147)
Sodium: 144 mEq/L (ref 137–147)
Sodium: 143 mEq/L (ref 137–147)

## 2014-01-29 LAB — CBC
HCT: 23.4 % — ABNORMAL LOW (ref 39.0–52.0)
HEMATOCRIT: 23.5 % — AB (ref 39.0–52.0)
Hemoglobin: 7.9 g/dL — ABNORMAL LOW (ref 13.0–17.0)
Hemoglobin: 8 g/dL — ABNORMAL LOW (ref 13.0–17.0)
MCH: 32.4 pg (ref 26.0–34.0)
MCH: 32.9 pg (ref 26.0–34.0)
MCHC: 33.6 g/dL (ref 30.0–36.0)
MCHC: 34.2 g/dL (ref 30.0–36.0)
MCV: 96.3 fL (ref 78.0–100.0)
MCV: 96.3 fL (ref 78.0–100.0)
PLATELETS: 149 10*3/uL — AB (ref 150–400)
Platelets: 207 10*3/uL (ref 150–400)
RBC: 2.44 MIL/uL — ABNORMAL LOW (ref 4.22–5.81)
RBC: 2.43 MIL/uL — ABNORMAL LOW (ref 4.22–5.81)
RDW: 14.3 % (ref 11.5–15.5)
RDW: 14.6 % (ref 11.5–15.5)
WBC: 6 10*3/uL (ref 4.0–10.5)
WBC: 5 10*3/uL (ref 4.0–10.5)

## 2014-01-29 LAB — COMPREHENSIVE METABOLIC PANEL
ALK PHOS: 362 U/L — AB (ref 39–117)
ALT: 139 U/L — AB (ref 0–53)
AST: 303 U/L — ABNORMAL HIGH (ref 0–37)
Albumin: 2.3 g/dL — ABNORMAL LOW (ref 3.5–5.2)
BUN: 61 mg/dL — ABNORMAL HIGH (ref 6–23)
CO2: 13 mEq/L — ABNORMAL LOW (ref 19–32)
Calcium: 8.3 mg/dL — ABNORMAL LOW (ref 8.4–10.5)
Chloride: 102 mEq/L (ref 96–112)
Creatinine, Ser: 1.38 mg/dL — ABNORMAL HIGH (ref 0.50–1.35)
GFR calc Af Amer: 62 mL/min — ABNORMAL LOW (ref >90)
GFR calc non Af Amer: 54 mL/min — ABNORMAL LOW (ref >90)
GLUCOSE: 117 mg/dL — AB (ref 70–99)
POTASSIUM: 4.1 meq/L (ref 3.7–5.3)
SODIUM: 144 meq/L (ref 137–147)
TOTAL PROTEIN: 5 g/dL — AB (ref 6.0–8.3)
Total Bilirubin: 0.7 mg/dL (ref 0.3–1.2)

## 2014-01-29 LAB — CBG MONITORING, ED
GLUCOSE-CAPILLARY: 317 mg/dL — AB (ref 70–99)
Glucose-Capillary: 147 mg/dL — ABNORMAL HIGH (ref 70–99)
Glucose-Capillary: 222 mg/dL — ABNORMAL HIGH (ref 70–99)
Glucose-Capillary: 226 mg/dL — ABNORMAL HIGH (ref 70–99)
Glucose-Capillary: 377 mg/dL — ABNORMAL HIGH (ref 70–99)
Glucose-Capillary: 394 mg/dL — ABNORMAL HIGH (ref 70–99)
Glucose-Capillary: 442 mg/dL — ABNORMAL HIGH (ref 70–99)
Glucose-Capillary: 72 mg/dL (ref 70–99)

## 2014-01-29 LAB — BLOOD GAS, VENOUS
Acid-base deficit: 19.5 mmol/L — ABNORMAL HIGH (ref 0.0–2.0)
Bicarbonate: 6.3 meq/L — ABNORMAL LOW (ref 20.0–24.0)
FIO2: 0.21 %
O2 Saturation: 84.8 %
Patient temperature: 98.6
TCO2: 6.1 mmol/L (ref 0–100)
pCO2, Ven: 14.2 mmHg — ABNORMAL LOW (ref 45.0–50.0)
pH, Ven: 7.269 (ref 7.250–7.300)
pO2, Ven: 62.2 mmHg — ABNORMAL HIGH (ref 30.0–45.0)

## 2014-01-29 LAB — GLUCOSE, CAPILLARY
GLUCOSE-CAPILLARY: 105 mg/dL — AB (ref 70–99)
GLUCOSE-CAPILLARY: 158 mg/dL — AB (ref 70–99)
GLUCOSE-CAPILLARY: 133 mg/dL — AB (ref 70–99)
GLUCOSE-CAPILLARY: 122 mg/dL — AB (ref 70–99)
GLUCOSE-CAPILLARY: 134 mg/dL — AB (ref 70–99)
GLUCOSE-CAPILLARY: 142 mg/dL — AB (ref 70–99)
GLUCOSE-CAPILLARY: 158 mg/dL — AB (ref 70–99)
Glucose-Capillary: 66 mg/dL — ABNORMAL LOW (ref 70–99)
Glucose-Capillary: 91 mg/dL (ref 70–99)
Glucose-Capillary: 136 mg/dL — ABNORMAL HIGH (ref 70–99)
Glucose-Capillary: 171 mg/dL — ABNORMAL HIGH (ref 70–99)
Glucose-Capillary: 189 mg/dL — ABNORMAL HIGH (ref 70–99)
Glucose-Capillary: 142 mg/dL — ABNORMAL HIGH (ref 70–99)
Glucose-Capillary: 137 mg/dL — ABNORMAL HIGH (ref 70–99)
Glucose-Capillary: 137 mg/dL — ABNORMAL HIGH (ref 70–99)
Glucose-Capillary: 146 mg/dL — ABNORMAL HIGH (ref 70–99)

## 2014-01-29 LAB — SALICYLATE LEVEL: Salicylate Lvl: <2 mg/dL — ABNORMAL LOW (ref 2.8–20.0)

## 2014-01-29 LAB — PROTIME-INR
INR: 1.24 (ref 0.00–1.49)
PROTHROMBIN TIME: 15.3 s — AB (ref 11.6–15.2)

## 2014-01-29 LAB — TROPONIN I: Troponin I: <0.3 ng/mL (ref <0.30)

## 2014-01-29 LAB — OSMOLALITY: Osmolality: 316 mOsm/kg — ABNORMAL HIGH (ref 275–300)

## 2014-01-29 LAB — LACTIC ACID, PLASMA
LACTIC ACID, VENOUS: 1.4 mmol/L (ref 0.5–2.2)
Lactic Acid, Venous: 2.9 mmol/L — ABNORMAL HIGH (ref 0.5–2.2)

## 2014-01-29 LAB — AMMONIA: Ammonia: 69 umol/L — ABNORMAL HIGH (ref 11–60)

## 2014-01-29 LAB — MAGNESIUM: Magnesium: 2.1 mg/dL (ref 1.5–2.5)

## 2014-01-29 LAB — TSH: TSH: 2.224 u[IU]/mL (ref 0.350–4.500)

## 2014-01-29 LAB — MRSA PCR SCREENING: MRSA BY PCR: NEGATIVE

## 2014-01-29 LAB — PHOSPHORUS: Phosphorus: 2.7 mg/dL (ref 2.3–4.6)

## 2014-01-29 LAB — PROCALCITONIN: Procalcitonin: 0.67 ng/mL

## 2014-01-29 LAB — CK: Total CK: 620 U/L — ABNORMAL HIGH (ref 7–232)

## 2014-01-29 MED ORDER — SUCRALFATE 1 GM/10ML PO SUSP
1.0000 g | Freq: Three times a day (TID) | ORAL | Status: DC
  Administered 2014-01-29 – 2014-02-11 (×53): 1 g via ORAL
  Filled 2014-01-29 (×57): qty 10

## 2014-01-29 MED ORDER — INSULIN REGULAR BOLUS VIA INFUSION
0.0000 [IU] | Freq: Three times a day (TID) | INTRAVENOUS | Status: DC
Start: 2014-01-29 — End: 2014-01-30
  Filled 2014-01-29: qty 10

## 2014-01-29 MED ORDER — ONDANSETRON HCL 4 MG PO TABS
4.0000 mg | ORAL_TABLET | Freq: Four times a day (QID) | ORAL | Status: DC | PRN
  Administered 2014-01-29: 4 mg via ORAL
  Filled 2014-01-29: qty 1

## 2014-01-29 MED ORDER — DEXTROSE 50 % IV SOLN
25.0000 mL | INTRAVENOUS | Status: DC | PRN
  Administered 2014-01-29: 14 mL via INTRAVENOUS
  Filled 2014-01-29: qty 50

## 2014-01-29 MED ORDER — HYDROCODONE-ACETAMINOPHEN 5-325 MG PO TABS
1.0000 | ORAL_TABLET | ORAL | Status: DC | PRN
  Administered 2014-01-31 – 2014-02-02 (×4): 1 via ORAL
  Administered 2014-02-02 – 2014-02-11 (×40): 2 via ORAL
  Filled 2014-01-29 (×12): qty 2
  Filled 2014-01-29: qty 1
  Filled 2014-01-29 (×7): qty 2
  Filled 2014-01-29: qty 1
  Filled 2014-01-29 (×11): qty 2
  Filled 2014-01-29: qty 1
  Filled 2014-01-29 (×8): qty 2
  Filled 2014-01-29: qty 1
  Filled 2014-01-29 (×2): qty 2

## 2014-01-29 MED ORDER — SODIUM CHLORIDE 0.9 % IV SOLN
INTRAVENOUS | Status: DC
  Administered 2014-01-29: 6.5 [IU]/h via INTRAVENOUS
  Administered 2014-01-29: 0.4 [IU]/h via INTRAVENOUS
  Filled 2014-01-29: qty 1

## 2014-01-29 MED ORDER — SODIUM CHLORIDE 0.9 % IV SOLN: INTRAVENOUS | Status: DC

## 2014-01-29 MED ORDER — MORPHINE SULFATE 2 MG/ML IJ SOLN
2.0000 mg | INTRAMUSCULAR | Status: DC | PRN
Start: 2014-01-29 — End: 2014-01-29

## 2014-01-29 MED ORDER — KCL IN DEXTROSE-NACL 40-5-0.45 MEQ/L-%-% IV SOLN
INTRAVENOUS | Status: DC
  Administered 2014-01-29 (×2): via INTRAVENOUS
  Filled 2014-01-29 (×3): qty 1000

## 2014-01-29 MED ORDER — CLONIDINE HCL 0.1 MG PO TABS
0.1000 mg | ORAL_TABLET | Freq: Three times a day (TID) | ORAL | Status: DC
  Administered 2014-01-29 – 2014-02-07 (×26): 0.1 mg via ORAL
  Filled 2014-01-29 (×30): qty 1

## 2014-01-29 MED ORDER — ONDANSETRON HCL 4 MG/2ML IJ SOLN: 4.0000 mg | Freq: Four times a day (QID) | INTRAMUSCULAR | Status: DC | PRN

## 2014-01-29 MED ORDER — SODIUM CHLORIDE 0.9 % IV SOLN
1000.0000 mL | INTRAVENOUS | Status: DC
  Administered 2014-01-29 – 2014-02-05 (×10): 1000 mL via INTRAVENOUS

## 2014-01-29 MED ORDER — SODIUM CHLORIDE 0.9 % IV SOLN
1000.0000 mL | INTRAVENOUS | Status: DC
  Administered 2014-01-29: 1000 mL via INTRAVENOUS

## 2014-01-29 MED ORDER — PANTOPRAZOLE SODIUM 40 MG IV SOLR
40.0000 mg | Freq: Two times a day (BID) | INTRAVENOUS | Status: DC
  Administered 2014-01-29 – 2014-02-02 (×9): 40 mg via INTRAVENOUS
  Filled 2014-01-29 (×9): qty 40

## 2014-01-29 MED ORDER — LORAZEPAM 2 MG/ML IJ SOLN
0.0000 mg | INTRAMUSCULAR | Status: DC
  Administered 2014-01-29 (×2): 1 mg via INTRAVENOUS
  Administered 2014-01-30: 2 mg via INTRAVENOUS
  Administered 2014-01-30: 1 mg via INTRAVENOUS
  Administered 2014-01-30 – 2014-01-31 (×7): 2 mg via INTRAVENOUS
  Administered 2014-01-31 (×2): 1 mg via INTRAVENOUS
  Administered 2014-01-31: 2 mg via INTRAVENOUS
  Administered 2014-02-01 (×4): 1 mg via INTRAVENOUS
  Filled 2014-01-29 (×19): qty 1

## 2014-01-29 MED ORDER — DOCUSATE SODIUM 100 MG PO CAPS
100.0000 mg | ORAL_CAPSULE | Freq: Two times a day (BID) | ORAL | Status: DC
  Administered 2014-01-29 – 2014-02-09 (×18): 100 mg via ORAL
  Filled 2014-01-29 (×28): qty 1

## 2014-01-29 MED ORDER — SODIUM CHLORIDE 0.9 % IJ SOLN
3.0000 mL | Freq: Two times a day (BID) | INTRAMUSCULAR | Status: DC
  Administered 2014-01-29: 3 mL via INTRAVENOUS
  Administered 2014-01-29: 10 mL via INTRAVENOUS
  Administered 2014-01-30 (×2): 3 mL via INTRAVENOUS
  Administered 2014-01-31: 10:00:00 via INTRAVENOUS
  Administered 2014-01-31 – 2014-02-07 (×2): 3 mL via INTRAVENOUS

## 2014-01-29 MED ORDER — FOLIC ACID 1 MG PO TABS
1.0000 mg | ORAL_TABLET | Freq: Every day | ORAL | Status: DC
  Administered 2014-01-29 – 2014-02-11 (×13): 1 mg via ORAL
  Filled 2014-01-29 (×14): qty 1

## 2014-01-29 NOTE — ED Notes (Signed)
Hospitalist at bedside at this time 

## 2014-01-29 NOTE — ED Notes (Signed)
NS at 192ml/hr started per verbal order Dr. Verlon Au

## 2014-01-29 NOTE — Progress Notes (Signed)
9:66 AM I agree with HPI/GPe and A/P per Dr. Roel Cluck   63 year old male, known history diabetes mellitus type 2, hypertension, significant ethanol abuse history pancreatitis found down at home. Patient admitted noted to be hypothermic Patient disoriented on admission however this cleared up likely and he relates that he was in his normal stat until Friday 2/20 when he had a fall and hit his left shoulder and was not able to get up initially. He got up maybe on Sunday evening but it is not clear whether he took all of his medications. He does relate that he is on insulin and takes it "for 3-4 times a day". He drinks 3-4 times a day but last drink was about 2 days ago 2/22 per his report It is unclear how much he drinks that he states he's been drinking heavily for the past 60 years He does use tobacco He uses no other drugs per his report.   Initial workup = sodium 139 potassium 5.6, bicarbonate less than 7, BUN 64, creatinine 1.7 Anion gap calculated = 43 Point-of-care troponin less than 0.3 Lactic acid 2.26 pro calcitonin 0.67 Ammonia 69 LFTs = alkaline phosphatase 436, lipase 238, AST 359, ALT 172, bilirubin 1.0  Chest x-ray = displaced fracture surgical neck left humerus no acute fracture-suspicious nodule? CT head = moderate brain atrophy    HEENT alert but not answering appropriately CHEST chest clinically clear CARDIAC S1-S2 tachycardic 100 range to 120 range with mild hypotension when seen in ED ABDOMEN soft nontender nondistended NEURO intact but not completely clear SKIN/MUSCULAR significant bruising to left shoulder  Patient Active Problem List   Diagnosis Date Noted  . Pancreatitis 01/29/2014  . Metabolic acidosis 28/41/3244  . DKA (diabetic ketoacidoses) 01/28/2014  . PUD (peptic ulcer disease) 06/21/2013  . GI bleed 06/21/2013  . Anemia 06/18/2013  . Low back pain 06/18/2013  . Hypertension 06/18/2013  . Hypokalemia 06/18/2013  . Hyponatremia 07/07/2012  . ETOH  abuse 07/07/2012  . Diabetes mellitus 12/08/7251   Metabolic acidosis-most likely secondary to DKA-Anion gap was 43  -curiously he had a venous blood gas done showing acidosis-ABG shows pH of 7.5 PCO2 of 22 PO2 109.  His current anion gap is still 29 however.  His blood alcohol level was less than 11-DDX = Expected anion gap =albumin x 2.5=5.75 ?Unmeasured anion Delta delta =29-5.7=24  He has a metabolic acidosis ? from both Lactic acidosis [Etoh/?Starvation/renal failure] + DKA.   His ABG shows respiratory alkalemia at 7.503   =[AG-12]//[Normal HCo3-Measured HCo3] =29/11 =2.63  Expected compensation for acidosis  PaCO2=1.2 x HCO3 22.7 not equal to 1.2 x 29=34.8  Will search for possible ingestions-Tox screen stat/Salicylate level   Appreciate in advance CCM recs -high risk for further decompensation.  Change to ICU status -Precedex might be needed -d/w Dr. Mike Craze in person -on back burner is shoulder injury--have not called ortho -Rpt Cbc 13:00 and transfuse if less than 7.0   Verneita Griffes, MD [facility] [phone]

## 2014-01-29 NOTE — H&P (Signed)
PCP:   [name], MD    Chief Complaint:  Found down  HPI: Benjamin [name] is a 63 y.o. male   has a past medical history of Diabetes mellitus; Hypertension; Alcohol abuse; and Pancreatitis.   Presented with  Patient has history of alcohol abuse and peptic ulcer disease. Was last admitted to July with her upper GI bleed. Patient resides at home by himself. He is unable to provide his own history. Per ER notes and EMS patient was found down on the floor covered in his own feces. Patient was noted to have large amount of bruising to his left shoulder. He was brought then to emergency department where he was found to have surgical neck fracture left proximal humerus. Patient was noted to be hypothermic requiring. After receiving IV fluids is dramatically proved. Initially he was found to be hyperglycemic and was started on glucose stabilizer with good results. He is blood work was significant for metabolic acidosis and increased lactic acid. Patient is currently improving. Lipase initially was noted to be elevated to 200 range. Patient states that he drinks 4-5 glasses of wine a day was drink was on Friday which is 4 days ago patient history somewhat unreliable.  Initially CCM was called to admission but defer to hospitalist.   Review of Systems:    To obtain patient is mildly confused   Past Medical History: Past Medical History  Diagnosis Date  . Diabetes mellitus   . Hypertension   . Alcohol abuse   . Pancreatitis    Past Surgical History  Procedure Laterality Date  . Knee surgery    . Pancreas surgery    . Esophagogastroduodenoscopy N/A 06/19/2013    Procedure: ESOPHAGOGASTRODUODENOSCOPY (EGD);  Surgeon: Cleotis Nipper, MD;  Location: [facility] ENDOSCOPY;  Service: Endoscopy;  Laterality: N/A;     Medications: Prior to Admission medications   Medication Sig Start Date End Date Taking? Authorizing Provider  cloNIDine (CATAPRES) 0.1 MG tablet Take 1 tablet (0.1 mg total) by mouth 3  (three) times daily. 07/11/12 07/11/13  Thurnell Lose, MD  folic acid (FOLVITE) 1 MG tablet Take 1 tablet (1 mg total) by mouth daily. 06/21/13   Ripudeep Krystal Eaton, MD  insulin NPH (HUMULIN N,NOVOLIN N) 100 UNIT/ML injection Inject 6 Units into the skin 2 (two) times daily.    Historical Provider, MD  Multiple Vitamin (MULTIVITAMIN WITH MINERALS) TABS Take 1 tablet by mouth daily. 07/11/12   Thurnell Lose, MD  pantoprazole (PROTONIX) 40 MG tablet Take 1 tablet (40 mg total) by mouth 2 (two) times daily. 06/21/13   Ripudeep Krystal Eaton, MD  sucralfate (CARAFATE) 1 GM/10ML suspension Take 10 mLs (1 g total) by mouth 4 (four) times daily -  with meals and at bedtime. 06/21/13   Ripudeep Krystal Eaton, MD  thiamine 100 MG tablet Take 1 tablet (100 mg total) by mouth daily. 06/21/13   Ripudeep Krystal Eaton, MD  zolpidem (AMBIEN) 5 MG tablet Take 5 mg by mouth at bedtime as needed for sleep.    Historical Provider, MD    Allergies:   Allergies  Allergen Reactions  . Tetanus Toxoids     rash    Social History:  Lives at  home   reports that he has been smoking Cigarettes.  He has a 40 pack-year smoking history. He has never used smokeless tobacco. He reports that he drinks alcohol. He reports that he does not use illicit drugs.   Family History: family history includes Dementia  in his father; Diabetes in his mother.    Physical Exam: Patient Vitals for the past 24 hrs:  BP Temp Temp src Pulse Resp SpO2  01/29/14 0230 121/67 mmHg 98.1 F (36.7 C) - 112 33 98 %  01/29/14 0227 121/70 mmHg 98 F (36.7 C) Core 113 32 98 %  01/29/14 0140 141/93 mmHg - - 113 - -  01/29/14 0130 141/93 mmHg - - 110 32 100 %  01/29/14 0030 110/66 mmHg 99 F (37.2 C) Core 116 35 98 %  01/29/14 0000 116/58 mmHg - - 110 22 100 %  01/28/14 2345 110/66 mmHg 97.6 F (36.4 C) Core 109 25 100 %  01/28/14 2300 127/78 mmHg - - 102 23 100 %  01/28/14 2230 116/74 mmHg - - - 26 -  01/28/14 2229 - 94.3 F (34.6 C) Core - - -  01/28/14 2215  103/69 mmHg - - 99 26 100 %  01/28/14 2043 83/65 mmHg - Rectal 110 28 100 %    1. General:  in No Acute distress 2. Psychological: Alert and  Oriented to self, not fully to situation 3. Head/ENT:    Dry Mucous Membranes                          Head Non traumatic, neck supple                           Poor Dentition 4. SKIN:decreased Skin turgor,  Skin clean Dry and intact no rash, bruising over left shoulder 5. Heart: Regular rate and rhythm no Murmur, Rub or gallop 6. Lungs: Clear to auscultation bilaterally, no wheezes or crackles   7. Abdomen: Soft, non-tender, Non distended 8. Lower extremities: no clubbing, cyanosis, or edema 9. Neurologically Grossly intact, moving all 3 extremities equally,tremoulous 10. MSK: Normal range of motion except limited in left shoulder  body mass index is unknown because there is no weight on file.   Labs on Admission:   Recent Labs  01/28/14 2200  NA 139  K 5.6*  CL 89*  CO2 <7*  GLUCOSE 485*  BUN 64*  CREATININE 1.78*  CALCIUM 8.5    Recent Labs  01/28/14 2200  AST 359*  ALT 172*  ALKPHOS 436*  BILITOT 1.0  PROT 5.6*  ALBUMIN 2.5*    Recent Labs  01/28/14 2200  LIPASE 238*    Recent Labs  01/28/14 2200  WBC 6.1  HGB 9.0*  HCT 27.5*  MCV 98.9  PLT 252    Recent Labs  01/28/14 2200  CKTOTAL 481*   No results found for this basename: TSH, T4TOTAL, FREET3, T3FREE, THYROIDAB,  in the last 72 hours No results found for this basename: VITAMINB12, FOLATE, FERRITIN, TIBC, IRON, RETICCTPCT,  in the last 72 hours Lab Results  Component Value Date   HGBA1C 8.6* 06/18/2013    The CrCl is unknown because both a height and weight (above a minimum accepted value) are required for this calculation. ABG    Component Value Date/Time   HCO3 6.3* 01/29/2014 0004   TCO2 6.1 01/29/2014 0004   ACIDBASEDEF 19.5* 01/29/2014 0004   O2SAT 84.8 01/29/2014 0004     No results found for this basename: DDIMER     Other  results:  I have pearsonaly reviewed this: ECG REPORT  Rate: 108  Rhythm: sinus tachycardia ST&T Change: no ischemia  UA no evidence of UTI, elevated ketones  Cultures:    Component Value Date/Time   SDES BLOOD LEFT ARM 07/07/2012 1514   SPECREQUEST BOTTLES DRAWN AEROBIC ONLY  3ML 07/07/2012 1514   CULT NO GROWTH 5 DAYS 07/07/2012 1514   REPTSTATUS 07/13/2012 FINAL 07/07/2012 1514       Radiological Exams on Admission: Dg Chest Port 1 View  01/28/2014   CLINICAL DATA:  Status post fall; left shoulder pain and bruising.  EXAM: PORTABLE CHEST - 1 VIEW  COMPARISON:  Left shoulder radiographs performed earlier today at 9:35 p.m.  FINDINGS: As noted on concurrent left shoulder radiographs, there is a significantly displaced fracture involving the surgical neck of the left humerus.  No additional fractures are seen.  The lungs are well-aerated. There is question of focal spiculation at the medial left lung base. There is no evidence of pleural effusion or pneumothorax. The cardiomediastinal silhouette is within normal limits.  IMPRESSION: 1. Significantly displaced fracture involving the surgical neck of the left humerus; no additional fracture seen. 2. Question of focal spiculation at the medial left lung base; a suspicious nodule cannot be excluded. CT of the chest would be helpful for further evaluation, when and as deemed clinically appropriate.   Electronically Signed   By: Garald Balding M.D.   On: 01/28/2014 22:13   Dg Shoulder Left Port  01/28/2014   CLINICAL DATA:  Fall.  Shoulder pain and bruising.  EXAM: PORTABLE LEFT SHOULDER - 2+ VIEW  COMPARISON:  None.  FINDINGS: Prominently displaced fracture of the surgical neck, with the humeral shaft fragment displaced about 3.6 cm anteriorly and with rotated humeral head fragment. The rotation makes it difficult to count the number of parts, although there is a lateral fragment measuring about 2.7 cm in long axis adjacent to the humeral head  fragment which could have arisen from the greater or lesser tuberosity.  IMPRESSION: 1. Considerably displaced surgical neck fracture of the left proximal humerus. Favor 2 or 3 parts.   Electronically Signed   By: Sherryl Barters M.D.   On: 01/28/2014 22:05    Chart has been reviewed  Assessment/Plan  This is a 63 year old gentleman history of alcohol abuse who presents with DKA in the setting of pancreatitis and alcohol withdrawal   Present on Admission:  . DKA (diabetic ketoacidoses) - patient is no history of diabetes on sliding scale apparently at home. Will continue with glucose stabilizer. Given that patient's pancreatic function at this point unclear will treat as type 1 diabetes DKA for now. Give IV fluids. Transition to long-lasting insulin once gap closes  . ETOH abuse - ciwa protocol social work consult   . Diabetes mellitus - glucose stabilizer  . PUD (peptic ulcer disease) - Protonix IV  . Anemia -  a current baseline continue to monitor  . Pancreatitis - diet n.p.o. IV fluids consider further imaging once patient stabilized   confusion likely secondary to alcohol withdrawal/abuse but given recent falls will obtain CT of the head and ammonia level left humerus fracture this obtain orthopedic consult in a.m.    Prophylaxis: SCD  , Protonix  CODE STATUS:  presumed to be full code  Other plan as per orders.  I have spent a total of 55 min on this admission  Wayne Wicklund 01/29/2014, 2:23 AM

## 2014-01-29 NOTE — ED Notes (Signed)
Respiratory at bedside collecting abg

## 2014-01-29 NOTE — Progress Notes (Signed)
CARE MANAGEMENT NOTE 01/29/2014  Patient:  AMARIUS, TOTO   Account Number:  [id number]  Date Initiated:  01/29/2014  Documentation initiated by:  Abdirahim Flavell  Subjective/Objective Assessment:   pt with hx of dka and etoh abuse, found down in the home, fell sustaining left humeral fracture of the head/ glucose in the 400's, acidotic by abgs, labs positive for pancreatitis     Action/Plan:   will follow for needs and development of etoh w/d   Anticipated DC Date:  02/01/2014   Anticipated DC Plan:  HOME/SELF CARE  In-house referral  Clinical Social Worker      DC Planning Services  NA      Choice offered to / List presented to:  NA   DME arranged  NA      DME agency  NA     [place] arranged  NA      [place] agency  NA   Status of service:  In process, will continue to follow Medicare Important Message given?  NA - LOS <3 / Initial given by admissions (If response is "NO", the following Medicare IM given date fields will be blank) Date Medicare IM given:   Date Additional Medicare IM given:    Discharge Disposition:    Per UR Regulation:  Reviewed for med. necessity/level of care/duration of stay  If discussed at [place] of Stay Meetings, dates discussed:    Comments:  [id number]/Kailyn Dubie Eldridge Dace, [place], [place] [phone] Chart Reviewed for discharge and hospital needs. Discharge needs at time of review:  None present will follow for needs. Review of patient progress due on [id number].

## 2014-01-29 NOTE — Progress Notes (Signed)
Inpatient Diabetes Program Recommendations  AACE/ADA: New Consensus Statement on Inpatient Glycemic Control (2013)  Target Ranges:  Prepandial:   less than 140 mg/dL      Peak postprandial:   less than 180 mg/dL (1-2 hours)      Critically ill patients:  140 - 180 mg/dL   Results for Benjamin [name], Benjamin [name] (MRN [id number]) as of 01/29/2014 10:00  Ref. Range 01/29/2014 00:02 01/29/2014 01:34 01/29/2014 02:41 01/29/2014 03:45 01/29/2014 04:49 01/29/2014 05:52 01/29/2014 06:57 01/29/2014 08:05  Glucose-Capillary Latest Range: 70-99 mg/dL 442 (H) 394 (H) 377 (H) 317 (H) 226 (H) 147 (H) 222 (H) 72    Diabetes history: DM2 Outpatient Diabetes medications: NPH 6 units BID Current orders for Inpatient glycemic control: Novolin R insulin drip via GlucoStabilizer, Novolin R 0-10 units TID with meals (1 unit for every 10 grams of carbs)  Inpatient Diabetes Program Recommendations Insulin - IV drip/GlucoStabilizer: Patient remains on Novolin R insulin drip on GlucoStabilizer. Insulin - Basal: When patient meets criteria to transition off of IV insulin drip, recommend starting patient on NPH 6 units BID (once NPH is given, IV insulin will be continued for 1-2 hours before being stopped).Per protocol: Basal SQ  insulin must be given 1-2 hours prior to discontinuation of insulin drip and Novolog SQ correction scale must be administered simultaneously when the drip is discontinued. Correction (SSI): When patient meets criteria to transition off of IV insulin drip, recommend starting patient on Novolog sensitive correction Q4H. Diet: Patient is currently NPO, when diet is advanced please consider ordering Carb Modified Diabetic diet.  Nursing, if diet ordered and patient consumes carbohydrates please be sure to cover carbohydrates as ordered while on IV insulin drip. A1C: Please order an A1C to evaluate glycemic control over the past 2-3 months.  Last A1C in the chart was 8.6% on 06/18/2013.  Thanks, Barnie Alderman, RN, MSN,  CCRN Diabetes Coordinator Inpatient Diabetes Program [phone] (Team Pager) [phone] ([facility] office) [phone] [facility] office)

## 2014-01-29 NOTE — Progress Notes (Addendum)
Patient seen and reviewed  Found passed out by brother [name],  broken shoulder, BAL was 400 when seen. Brother thinks he might have down for 2 days Last spoken with Friday 15:30 Never thought to be suicidal Not reportedly Suicidal  Supposed to have his PVD looked at next week by Vascular  Patient much more alert oriented, used to be a cook at restaurants.  Re-started drinimign a couple years ago. Never had Dt's Never has withdrawn Usually erratic on Insulin   Noted h/o GIB s/p scope Dr. Cristina Gong 06/17/13 [admitted c UGIB that time]   Current labs  AG=23  = 24-11/24-16] =13/8 =1.6  ? Pure AG acidosis   DDx=Renal Failure from fall + Rhabdo most likely etiology, cannot exclude however component of acidosis from Ethanol and lastly this could be DKA still.  Might benefit from Bicarb Gtt> Insulin Gtt-Will discuss c Nephrology, as there are concurrent risks off Bicarb gtt as wel  Osmolality ordered.  Calculate OSm gap to determine if other ingestion [unlikely]  Addend-d/ Dr. Jonnie Finner, Nephrology who recommends to rule out Methanol, Ethylene glycol ingestion As patient clinically improving, await Osmolal Gap [Osm measured] and then decide on Bicarb vs Insulin Gtt      CIWA=7,8,8  On glucommander-sugars mainly in 100 range  Tox/ pending Salicylate neg  Keep in SDU Get CPK/Lactic acid/OSm Anticipate DT's will get a little worse Close monitoring for need for Precedex-clonidine on board CCM aware of patient  Discussed c Family on phone [name],Benjamin Other [phone]  Verneita Griffes, MD [facility] [phone]

## 2014-01-30 ENCOUNTER — Inpatient Hospital Stay (HOSPITAL_COMMUNITY): Payer: BC Managed Care – PPO

## 2014-01-30 DIAGNOSIS — M6282 Rhabdomyolysis: Secondary | ICD-10-CM | POA: Diagnosis present

## 2014-01-30 DIAGNOSIS — K859 Acute pancreatitis without necrosis or infection, unspecified: Secondary | ICD-10-CM | POA: Diagnosis present

## 2014-01-30 DIAGNOSIS — G9340 Encephalopathy, unspecified: Secondary | ICD-10-CM | POA: Diagnosis present

## 2014-01-30 DIAGNOSIS — S42309A Unspecified fracture of shaft of humerus, unspecified arm, initial encounter for closed fracture: Secondary | ICD-10-CM | POA: Diagnosis present

## 2014-01-30 DIAGNOSIS — N390 Urinary tract infection, site not specified: Secondary | ICD-10-CM | POA: Diagnosis present

## 2014-01-30 LAB — DRUGS OF ABUSE SCREEN W/O ALC, ROUTINE URINE
Amphetamine Screen, Ur: NEGATIVE
BARBITURATE QUANT UR: NEGATIVE
Benzodiazepines.: NEGATIVE
COCAINE METABOLITES: NEGATIVE
CREATININE, U: 80.7 mg/dL
Marijuana Metabolite: NEGATIVE
Methadone: NEGATIVE
Opiate Screen, Urine: NEGATIVE
PHENCYCLIDINE (PCP): NEGATIVE
PROPOXYPHENE: NEGATIVE

## 2014-01-30 LAB — URINALYSIS, ROUTINE W REFLEX MICROSCOPIC
Bilirubin Urine: NEGATIVE
Glucose, UA: 250 mg/dL — AB
Ketones, ur: 15 mg/dL — AB
NITRITE: POSITIVE — AB
Protein, ur: NEGATIVE mg/dL
Specific Gravity, Urine: 1.021 (ref 1.005–1.030)
UROBILINOGEN UA: 1 mg/dL (ref 0.0–1.0)
pH: 5.5 (ref 5.0–8.0)

## 2014-01-30 LAB — COMPREHENSIVE METABOLIC PANEL
ALK PHOS: 366 U/L — AB (ref 39–117)
ALT: 94 U/L — ABNORMAL HIGH (ref 0–53)
ALT: 92 U/L — ABNORMAL HIGH (ref 0–53)
AST: 112 U/L — AB (ref 0–37)
AST: 111 U/L — AB (ref 0–37)
Albumin: 2.1 g/dL — ABNORMAL LOW (ref 3.5–5.2)
Albumin: 2.2 g/dL — ABNORMAL LOW (ref 3.5–5.2)
Alkaline Phosphatase: 372 U/L — ABNORMAL HIGH (ref 39–117)
BILIRUBIN TOTAL: 0.7 mg/dL (ref 0.3–1.2)
BILIRUBIN TOTAL: 0.8 mg/dL (ref 0.3–1.2)
BUN: 38 mg/dL — ABNORMAL HIGH (ref 6–23)
BUN: 28 mg/dL — ABNORMAL HIGH (ref 6–23)
CALCIUM: 8.1 mg/dL — AB (ref 8.4–10.5)
CHLORIDE: 109 meq/L (ref 96–112)
CHLORIDE: 109 meq/L (ref 96–112)
CO2: 21 mEq/L (ref 19–32)
CO2: 20 meq/L (ref 19–32)
CREATININE: 0.64 mg/dL (ref 0.50–1.35)
Calcium: 8 mg/dL — ABNORMAL LOW (ref 8.4–10.5)
Creatinine, Ser: 0.72 mg/dL (ref 0.50–1.35)
GFR calc Af Amer: >90 mL/min (ref >90)
GFR calc non Af Amer: >90 mL/min (ref >90)
GLUCOSE: 120 mg/dL — AB (ref 70–99)
Glucose, Bld: 138 mg/dL — ABNORMAL HIGH (ref 70–99)
POTASSIUM: 3.9 meq/L (ref 3.7–5.3)
POTASSIUM: 3.6 meq/L — AB (ref 3.7–5.3)
Sodium: 143 mEq/L (ref 137–147)
Sodium: 143 mEq/L (ref 137–147)
TOTAL PROTEIN: 4.7 g/dL — AB (ref 6.0–8.3)
Total Protein: 5.1 g/dL — ABNORMAL LOW (ref 6.0–8.3)

## 2014-01-30 LAB — BASIC METABOLIC PANEL
BUN: 40 mg/dL — ABNORMAL HIGH (ref 6–23)
CO2: 22 mEq/L (ref 19–32)
CREATININE: 0.77 mg/dL (ref 0.50–1.35)
Calcium: 8.1 mg/dL — ABNORMAL LOW (ref 8.4–10.5)
Chloride: 107 mEq/L (ref 96–112)
GFR calc non Af Amer: >90 mL/min (ref >90)
Glucose, Bld: 111 mg/dL — ABNORMAL HIGH (ref 70–99)
POTASSIUM: 4.3 meq/L (ref 3.7–5.3)
Sodium: 144 mEq/L (ref 137–147)

## 2014-01-30 LAB — ACETAMINOPHEN LEVEL

## 2014-01-30 LAB — URINE MICROSCOPIC-ADD ON

## 2014-01-30 LAB — GLUCOSE, CAPILLARY
GLUCOSE-CAPILLARY: 130 mg/dL — AB (ref 70–99)
GLUCOSE-CAPILLARY: 141 mg/dL — AB (ref 70–99)
Glucose-Capillary: 129 mg/dL — ABNORMAL HIGH (ref 70–99)
Glucose-Capillary: 118 mg/dL — ABNORMAL HIGH (ref 70–99)
Glucose-Capillary: 138 mg/dL — ABNORMAL HIGH (ref 70–99)
Glucose-Capillary: 112 mg/dL — ABNORMAL HIGH (ref 70–99)
Glucose-Capillary: 248 mg/dL — ABNORMAL HIGH (ref 70–99)
Glucose-Capillary: 193 mg/dL — ABNORMAL HIGH (ref 70–99)
Glucose-Capillary: 149 mg/dL — ABNORMAL HIGH (ref 70–99)

## 2014-01-30 LAB — ALCOHOL, METHYL (METHANOL), BLOOD: Methanol Lvl: NEGATIVE

## 2014-01-30 LAB — CBC
HCT: 22.9 % — ABNORMAL LOW (ref 39.0–52.0)
Hemoglobin: 7.7 g/dL — ABNORMAL LOW (ref 13.0–17.0)
MCH: 32.6 pg (ref 26.0–34.0)
MCHC: 33.6 g/dL (ref 30.0–36.0)
MCV: 97 fL (ref 78.0–100.0)
PLATELETS: 134 10*3/uL — AB (ref 150–400)
RBC: 2.36 MIL/uL — ABNORMAL LOW (ref 4.22–5.81)
RDW: 15 % (ref 11.5–15.5)
WBC: 4.7 10*3/uL (ref 4.0–10.5)

## 2014-01-30 LAB — PROCALCITONIN: Procalcitonin: 0.41 ng/mL

## 2014-01-30 LAB — OXYCODONE SCREEN, URINE: OXYCODONE SCRN UR: NEGATIVE ng/mL

## 2014-01-30 LAB — HEMOGLOBIN A1C
HEMOGLOBIN A1C: 10.8 % — AB (ref <5.7)
Mean Plasma Glucose: 263 mg/dL — ABNORMAL HIGH (ref <117)

## 2014-01-30 LAB — ETHYLENE GLYCOL: Ethylene Glycol Lvl: <5 mg/dL (ref <5)

## 2014-01-30 LAB — CK: Total CK: 481 U/L — ABNORMAL HIGH (ref 7–232)

## 2014-01-30 MED ORDER — INSULIN ASPART 100 UNIT/ML ~~LOC~~ SOLN
0.0000 [IU] | Freq: Every day | SUBCUTANEOUS | Status: DC
  Administered 2014-02-03 – 2014-02-10 (×3): 2 [IU] via SUBCUTANEOUS

## 2014-01-30 MED ORDER — DEXTROSE 50 % IV SOLN: 25.0000 mL | INTRAVENOUS | Status: DC | PRN

## 2014-01-30 MED ORDER — DEXTROSE 5 % IV SOLN
1.0000 g | INTRAVENOUS | Status: DC
  Administered 2014-01-30 – 2014-02-05 (×7): 1 g via INTRAVENOUS
  Filled 2014-01-30 (×8): qty 10

## 2014-01-30 MED ORDER — INSULIN NPH (HUMAN) (ISOPHANE) 100 UNIT/ML ~~LOC~~ SUSP
6.0000 [IU] | Freq: Two times a day (BID) | SUBCUTANEOUS | Status: DC
  Administered 2014-01-30 – 2014-02-07 (×16): 6 [IU] via SUBCUTANEOUS
  Filled 2014-01-30 (×2): qty 10

## 2014-01-30 MED ORDER — SODIUM CHLORIDE 0.9 % IV SOLN
3.0000 g | Freq: Four times a day (QID) | INTRAVENOUS | Status: DC
  Administered 2014-01-30: 3 g via INTRAVENOUS
  Filled 2014-01-30 (×3): qty 3

## 2014-01-30 MED ORDER — DEXTROSE 50 % IV SOLN: 50.0000 mL | INTRAVENOUS | Status: DC | PRN

## 2014-01-30 MED ORDER — LACTULOSE 10 GM/15ML PO SOLN
10.0000 g | Freq: Three times a day (TID) | ORAL | Status: DC
  Administered 2014-01-30 – 2014-02-03 (×12): 10 g via ORAL
  Filled 2014-01-30 (×14): qty 15

## 2014-01-30 MED ORDER — ACETAMINOPHEN 325 MG PO TABS
650.0000 mg | ORAL_TABLET | Freq: Four times a day (QID) | ORAL | Status: DC | PRN
  Administered 2014-01-30: 650 mg via ORAL
  Filled 2014-01-30: qty 2

## 2014-01-30 MED ORDER — GLUCOSE 40 % PO GEL: 1.0000 | ORAL | Status: DC | PRN

## 2014-01-30 MED ORDER — INSULIN ASPART 100 UNIT/ML ~~LOC~~ SOLN
0.0000 [IU] | Freq: Three times a day (TID) | SUBCUTANEOUS | Status: DC
  Administered 2014-01-30: 1 [IU] via SUBCUTANEOUS
  Administered 2014-01-30: 3 [IU] via SUBCUTANEOUS
  Administered 2014-01-31: 2 [IU] via SUBCUTANEOUS
  Administered 2014-02-01: 1 [IU] via SUBCUTANEOUS
  Administered 2014-02-01: 3 [IU] via SUBCUTANEOUS
  Administered 2014-02-02 (×2): 2 [IU] via SUBCUTANEOUS
  Administered 2014-02-02: 3 [IU] via SUBCUTANEOUS
  Administered 2014-02-03 – 2014-02-04 (×3): 1 [IU] via SUBCUTANEOUS
  Administered 2014-02-04: 3 [IU] via SUBCUTANEOUS
  Administered 2014-02-04: 1 [IU] via SUBCUTANEOUS
  Administered 2014-02-05: 3 [IU] via SUBCUTANEOUS
  Administered 2014-02-06: 2 [IU] via SUBCUTANEOUS
  Administered 2014-02-06: 1 [IU] via SUBCUTANEOUS
  Administered 2014-02-06: 2 [IU] via SUBCUTANEOUS
  Administered 2014-02-07: 3 [IU] via SUBCUTANEOUS
  Administered 2014-02-07: 2 [IU] via SUBCUTANEOUS
  Administered 2014-02-08: 1 [IU] via SUBCUTANEOUS
  Administered 2014-02-09: 3 [IU] via SUBCUTANEOUS
  Administered 2014-02-09: 14:00:00 via SUBCUTANEOUS
  Administered 2014-02-09 – 2014-02-10 (×4): 2 [IU] via SUBCUTANEOUS
  Administered 2014-02-11: 3 [IU] via SUBCUTANEOUS
  Administered 2014-02-11: 2 [IU] via SUBCUTANEOUS

## 2014-01-30 NOTE — Progress Notes (Signed)
PROGRESS NOTE  Benjamin [name] [id number] DOB: [birth year] DOA: 01/28/2014 PCP: [name], MD  Assessment/Plan: DKA - off insulin gtt, continue s.q. ETOH Abuse - On CIWA, still withdrawing.  Metabolic acidosis - improving UTI - patient febrile overnight, UA positive. Ceftriaxone. CXR clear, less likely aspiration PNA.  DM - continue NPH and SSI Left infrahilar density on CXR - needs a CT, will order tomorrow.  PUD - continue PPI Rhabdo - CK trending down Anemia - no apparent active bleeding, monitor.  Pancreatitis - clinically improving, carb diet Left proximal humerus fracture - ortho consult today Acute encephalopathy - ETOH, elevated ammonia. Slowly improving.   Diet: carb modified Fluids: NS DVT Prophylaxis: SCD  Code Status: Full Family Communication: none  Disposition Plan: inpatient  Consultants:  Orthopedic surgery  Procedures:  none   Antibiotics - Ceftriaxone  HPI/Subjective: - feeling better today, still tremulous. Mild confusion, thinks he is at [facility].   Objective: Filed Vitals:   01/30/14 0300 01/30/14 0400 01/30/14 0500 01/30/14 0600  BP: 116/75 117/68 140/82 156/72  Pulse: 108 102 109 109  Temp: 100.4 F (38 C) 100.4 F (38 C) 100.4 F (38 C) 100.4 F (38 C)  TempSrc:      Resp: 24 18 8 22   Height:      Weight:      SpO2: 95% 95% 98% 97%    Intake/Output Summary (Last 24 hours) at 01/30/14 0749 Last data filed at 01/30/14 0600  Gross per 24 hour  Intake 3324.35 ml  Output   1325 ml  Net 1999.35 ml   Filed Weights   01/29/14 0930  Weight: 67.2 kg (148 lb 2.4 oz)    Exam:  General:  NAD, disheveled  Cardiovascular: regular rate and rhythm, without MRG  Respiratory: good air movement, clear to auscultation throughout, no wheezing, ronchi or rales  Abdomen: soft, not tender to palpation, positive bowel sounds  MSK: no peripheral edema Neuro: non focal  Data Reviewed: Basic Metabolic Panel:  Recent Labs Lab  01/29/14 0220 01/29/14 0638  01/29/14 1716 01/29/14 1959 01/29/14 2320 01/30/14 0200 01/30/14 0450  NA 141 144  < > 144 143 143 144 143  K 4.4 4.1  < > 4.2 4.1 3.9 4.3 3.9  CL 95* 102  < > 107 107 107 107 109  CO2 <7* 13*  < > 20 20 20 22 21   GLUCOSE 396* 117*  < > 125* 143* 137* 111* 138*  BUN 61* 61*  < > 51* 47* 43* 40* 38*  CREATININE 1.51* 1.38*  < > 0.92 0.83 0.80 0.77 0.72  CALCIUM 7.7* 8.3*  < > 8.1* 8.0* 8.0* 8.1* 8.0*  MG  --  2.1  --   --   --   --   --   --   PHOS  --  2.7  --   --   --   --   --   --   < > = values in this interval not displayed. Liver Function Tests:  Recent Labs Lab 01/28/14 2200 01/29/14 0638 01/30/14 0450  AST 359* 303* 112*  ALT 172* 139* 94*  ALKPHOS 436* 362* 366*  BILITOT 1.0 0.7 0.7  PROT 5.6* 5.0* 4.7*  ALBUMIN 2.5* 2.3* 2.1*    Recent Labs Lab 01/28/14 2200  LIPASE 238*    Recent Labs Lab 01/29/14 0430  AMMONIA 69*   CBC:  Recent Labs Lab 01/28/14 2200 01/29/14 0638 01/29/14 1355 01/30/14 0450  WBC 6.1 6.0 5.0  4.7  HGB 9.0* 7.9* 8.0* 7.7*  HCT 27.5* 23.5* 23.4* 22.9*  MCV 98.9 96.3 96.3 97.0  PLT 252 207 149* 134*   Cardiac Enzymes:  Recent Labs Lab 01/28/14 2200 01/29/14 0220 01/29/14 0831 01/29/14 1355 01/29/14 1838  CKTOTAL 481*  --   --   --  15*  TROPONINI  --  <0.30 <0.30 <0.30  --    BNP (last 3 results) No results found for this basename: PROBNP,  in the last 8760 hours CBG:  Recent Labs Lab 01/29/14 2306 01/29/14 2351 01/30/14 0111 01/30/14 0205 01/30/14 0305  GLUCAP 158* 141* 129* 118* 130*    Recent Results (from the past 240 hour(s))  MRSA PCR SCREENING     Status: None   Collection Time    01/29/14  9:09 AM      Result Value Ref Range Status   MRSA by PCR NEGATIVE  NEGATIVE Final   Comment:            The GeneXpert MRSA Assay (FDA     approved for NASAL specimens     only), is one component of a     comprehensive MRSA colonization     surveillance program. It is not      intended to diagnose MRSA     infection nor to guide or     monitor treatment for     MRSA infections.     Studies: Ct Head Wo Contrast  01/29/2014   CLINICAL DATA:  Found down.  EXAM: CT HEAD WITHOUT CONTRAST  TECHNIQUE: Contiguous axial images were obtained from the base of the skull through the vertex without intravenous contrast.  COMPARISON:  None available for comparison at time of study interpretation.  FINDINGS: Moderate ventriculomegaly, likely on the basis of global parenchymal brain volume loss as there is overall commensurate enlargement of the cerebral sulci and cerebellar folia, advanced for age. Patchy to confluent supratentorial and left pontine white matter hypodensities are advanced for age, without midline shift or mass effect. No intraparenchymal hemorrhage. No acute large vascular territory infarct.  Basal cisterns are patent. Moderate calcific atherosclerosis of the carotid siphons. Mild paranasal sinus mucosal thickening with trace mucoperiosteal reaction. No paranasal sinus air-fluid levels. Ocular globes and orbital contents are unremarkable. No skull fracture. Mastoid air cells are well aerated.  IMPRESSION: No acute intracranial process.  Moderate global brain atrophy, advanced for age.  Moderate white matter changes suggesting chronic small vessel ischemic disease, advanced for age.   Electronically Signed   By: Elon Alas   On: 01/29/2014 04:06   Dg Chest Port 1 View  01/28/2014   CLINICAL DATA:  Status post fall; left shoulder pain and bruising.  EXAM: PORTABLE CHEST - 1 VIEW  COMPARISON:  Left shoulder radiographs performed earlier today at 9:35 p.m.  FINDINGS: As noted on concurrent left shoulder radiographs, there is a significantly displaced fracture involving the surgical neck of the left humerus.  No additional fractures are seen.  The lungs are well-aerated. There is question of focal spiculation at the medial left lung base. There is no evidence of pleural  effusion or pneumothorax. The cardiomediastinal silhouette is within normal limits.  IMPRESSION: 1. Significantly displaced fracture involving the surgical neck of the left humerus; no additional fracture seen. 2. Question of focal spiculation at the medial left lung base; a suspicious nodule cannot be excluded. CT of the chest would be helpful for further evaluation, when and as deemed clinically appropriate.   Electronically Signed  By: Garald Balding M.D.   On: 01/28/2014 22:13   Dg Shoulder Left Port  01/28/2014   CLINICAL DATA:  Fall.  Shoulder pain and bruising.  EXAM: PORTABLE LEFT SHOULDER - 2+ VIEW  COMPARISON:  None.  FINDINGS: Prominently displaced fracture of the surgical neck, with the humeral shaft fragment displaced about 3.6 cm anteriorly and with rotated humeral head fragment. The rotation makes it difficult to count the number of parts, although there is a lateral fragment measuring about 2.7 cm in long axis adjacent to the humeral head fragment which could have arisen from the greater or lesser tuberosity.  IMPRESSION: 1. Considerably displaced surgical neck fracture of the left proximal humerus. Favor 2 or 3 parts.   Electronically Signed   By: Sherryl Barters M.D.   On: 01/28/2014 22:05    Scheduled Meds: . cloNIDine  0.1 mg Oral TID  . docusate sodium  100 mg Oral BID  . folic acid  1 mg Oral Daily  . insulin aspart  0-5 Units Subcutaneous QHS  . insulin aspart  0-9 Units Subcutaneous TID WC  . insulin NPH Human  6 Units Subcutaneous BID AC & HS  . LORazepam  0-4 mg Intravenous Q4H  . pantoprazole (PROTONIX) IV  40 mg Intravenous Q12H  . sodium chloride  3 mL Intravenous Q12H  . sucralfate  1 g Oral TID WC & HS  . thiamine  100 mg Intravenous Daily  . thiamine  100 mg Oral Daily   Continuous Infusions: . sodium chloride 1,000 mL (01/29/14 1800)  . insulin (NOVOLIN-R) infusion 1.6 Units/hr (01/29/14 1752)    Active Problems:   ETOH abuse   Diabetes mellitus    Anemia   PUD (peptic ulcer disease)   DKA (diabetic ketoacidoses)   Pancreatitis   Metabolic acidosis   Humerus fracture   Rhabdomyolysis   Pancreatitis, acute   Acute encephalopathy   UTI (urinary tract infection)  Time spent: 35  This note has been created with Surveyor, quantity. Any transcriptional errors are unintentional.   Marzetta Board, MD [facility] Pager [phone]. If 7 PM - 7 AM, please contact night-coverage at [email or web address], password [email or web address] 01/30/2014, 7:49 AM  LOS: 2 days

## 2014-01-31 ENCOUNTER — Inpatient Hospital Stay (HOSPITAL_COMMUNITY): Payer: BC Managed Care – PPO

## 2014-01-31 LAB — GLUCOSE, CAPILLARY
GLUCOSE-CAPILLARY: 73 mg/dL (ref 70–99)
GLUCOSE-CAPILLARY: 88 mg/dL (ref 70–99)
Glucose-Capillary: 133 mg/dL — ABNORMAL HIGH (ref 70–99)

## 2014-01-31 LAB — MAGNESIUM: MAGNESIUM: 2 mg/dL (ref 1.5–2.5)

## 2014-01-31 LAB — COMPREHENSIVE METABOLIC PANEL
ALT: 91 U/L — ABNORMAL HIGH (ref 0–53)
AST: 116 U/L — ABNORMAL HIGH (ref 0–37)
Albumin: 2 g/dL — ABNORMAL LOW (ref 3.5–5.2)
Alkaline Phosphatase: 361 U/L — ABNORMAL HIGH (ref 39–117)
BUN: 22 mg/dL (ref 6–23)
CALCIUM: 8.1 mg/dL — AB (ref 8.4–10.5)
CO2: 25 mEq/L (ref 19–32)
Chloride: 109 mEq/L (ref 96–112)
Creatinine, Ser: 0.62 mg/dL (ref 0.50–1.35)
GFR calc non Af Amer: >90 mL/min (ref >90)
GLUCOSE: 161 mg/dL — AB (ref 70–99)
Potassium: 3.1 mEq/L — ABNORMAL LOW (ref 3.7–5.3)
SODIUM: 144 meq/L (ref 137–147)
Total Bilirubin: 0.6 mg/dL (ref 0.3–1.2)
Total Protein: 5.1 g/dL — ABNORMAL LOW (ref 6.0–8.3)

## 2014-01-31 LAB — CBC
HCT: 22.6 % — ABNORMAL LOW (ref 39.0–52.0)
HEMOGLOBIN: 7.5 g/dL — AB (ref 13.0–17.0)
MCH: 32.5 pg (ref 26.0–34.0)
MCHC: 33.2 g/dL (ref 30.0–36.0)
MCV: 97.8 fL (ref 78.0–100.0)
Platelets: 133 10*3/uL — ABNORMAL LOW (ref 150–400)
RBC: 2.31 MIL/uL — ABNORMAL LOW (ref 4.22–5.81)
RDW: 15.4 % (ref 11.5–15.5)
WBC: 4 10*3/uL (ref 4.0–10.5)

## 2014-01-31 LAB — PROCALCITONIN: Procalcitonin: 0.24 ng/mL

## 2014-01-31 LAB — PROTIME-INR
INR: 1.28 (ref 0.00–1.49)
Prothrombin Time: 15.7 seconds — ABNORMAL HIGH (ref 11.6–15.2)

## 2014-01-31 LAB — PHOSPHORUS: Phosphorus: 1.3 mg/dL — ABNORMAL LOW (ref 2.3–4.6)

## 2014-01-31 MED ORDER — POTASSIUM CHLORIDE CRYS ER 20 MEQ PO TBCR
30.0000 meq | EXTENDED_RELEASE_TABLET | Freq: Once | ORAL | Status: AC
  Administered 2014-01-31: 30 meq via ORAL
  Filled 2014-01-31: qty 1

## 2014-01-31 MED ORDER — POTASSIUM PHOSPHATE DIBASIC 3 MMOLE/ML IV SOLN
30.0000 mmol | Freq: Once | INTRAVENOUS | Status: AC
  Administered 2014-01-31: 30 mmol via INTRAVENOUS
  Filled 2014-01-31: qty 10

## 2014-01-31 NOTE — Progress Notes (Signed)
[place] Progress Note Patient Name: Benjamin [name] DOB: 09-29-1951 MRN: [id number]  Date of Service  01/31/2014   HPI/Events of Note   K, Phos both low  eICU Interventions  KPhos now   Intervention Category Minor Interventions: Electrolytes abnormality - evaluation and management  [name] 01/31/2014, 4:06 AM

## 2014-01-31 NOTE — Consult Note (Signed)
Reason for Consult:Left proximal humerus fracture Referring Physician: Cruzita Lederer  HPI: Benjamin [name] is an 63 y.o. male Patient with a past history of alcohol abuse and peptic ulcer disease. Was last admitted to July with  upper GI bleed. Patient resides at home by himself. He is unable to provide his own history. Per ER notes and EMS patient was found down on the floor covered in his own feces. Patient was noted to have large amount of bruising to his left shoulder. He was brought then to emergency department where he was found to have surgical neck fracture left proximal humerus.  Patient was noted to be hypothermic requiring. After receiving IV fluids is dramatically proved. Initially he was found to be hyperglycemic and was started on glucose stabilizer with good results. He is blood work was significant for metabolic acidosis and increased lactic acid. Patient is currently improving. Lipase initially was noted to be elevated to 200 range. Patient states that he drinks 4-5 glasses of wine a day was drink was on Friday which is 4 days ago patient history somewhat unreliable. Initially CCM was called to admission but defer to hospitalist.  Patient disoriented on admission however this cleared up likely and he relates that he was in his normal stat until Friday 2/20 when he had a fall and hit his left shoulder and was not able to get up initially. He got up maybe on Sunday evening but it is not clear whether he took all of his medications.  He does relate that he is on insulin and takes it "for 3-4 times a day".  He drinks 3-4 times a day but last drink was about 2 days ago 2/22 per his report  It is unclear how much he drinks that he states he's been drinking heavily for the past 60 years  Since admission has shown gradual but limited improvement in cognition      Past Medical History  Diagnosis Date  . Diabetes mellitus   . Hypertension   . Alcohol abuse   . Pancreatitis     Past Surgical  History  Procedure Laterality Date  . Knee surgery    . Pancreas surgery    . Esophagogastroduodenoscopy N/A 06/19/2013    Procedure: ESOPHAGOGASTRODUODENOSCOPY (EGD);  Surgeon: Cleotis Nipper, MD;  Location: [facility] ENDOSCOPY;  Service: Endoscopy;  Laterality: N/A;    Family History  Problem Relation Age of Onset  . Diabetes Mother   . Dementia Father     Social History:  reports that he has been smoking Cigarettes.  He has a 40 pack-year smoking history. He has never used smokeless tobacco. He reports that he drinks alcohol. He reports that he does not use illicit drugs.  Allergies:  Allergies  Allergen Reactions  . Tetanus Toxoids Rash    Medications: I have reviewed the patient's current medications.  Results for orders placed during the hospital encounter of 01/28/14 (from the past 48 hour(s))  GLUCOSE, CAPILLARY     Status: Abnormal   Collection Time    01/29/14  5:49 PM      Result Value Ref Range   Glucose-Capillary 142 (*) 70 - 99 mg/dL  LACTIC ACID, PLASMA     Status: None   Collection Time    01/29/14  6:00 PM      Result Value Ref Range   Lactic Acid, Venous 1.4  0.5 - 2.2 mmol/L  OSMOLALITY     Status: Abnormal   Collection Time    01/29/14  6:37 PM      Result Value Ref Range   Osmolality 316 (*) 275 - 300 mOsm/kg   Comment: Performed at [facility]  CK     Status: Abnormal   Collection Time    01/29/14  6:38 PM      Result Value Ref Range   Total CK 620 (*) 7 - 232 U/L  GLUCOSE, CAPILLARY     Status: Abnormal   Collection Time    01/29/14  6:43 PM      Result Value Ref Range   Glucose-Capillary 142 (*) 70 - 99 mg/dL  GLUCOSE, CAPILLARY     Status: Abnormal   Collection Time    01/29/14  7:51 PM      Result Value Ref Range   Glucose-Capillary 137 (*) 70 - 99 mg/dL   Comment 1 Notify RN    BASIC METABOLIC PANEL     Status: Abnormal   Collection Time    01/29/14  7:59 PM      Result Value Ref Range   Sodium 143  137 - 147 mEq/L    Potassium 4.1  3.7 - 5.3 mEq/L   Chloride 107  96 - 112 mEq/L   CO2 20  19 - 32 mEq/L   Glucose, Bld 143 (*) 70 - 99 mg/dL   BUN 47 (*) 6 - 23 mg/dL   Creatinine, Ser 0.83  0.50 - 1.35 mg/dL   Calcium 8.0 (*) 8.4 - 10.5 mg/dL   GFR calc non Af Amer >90  >90 mL/min   GFR calc Af Amer >90  >90 mL/min   Comment: (NOTE)     The eGFR has been calculated using the CKD EPI equation.     This calculation has not been validated in all clinical situations.     eGFR's persistently <90 mL/min signify possible Chronic Kidney     Disease.  ALCOHOL, METHYL (METHANOL), BLOOD     Status: None   Collection Time    01/29/14  8:30 PM      Result Value Ref Range   Methanol Lvl NEGATIVE     Comment: Performed at [facility]     Status: None   Collection Time    01/29/14  8:30 PM      Result Value Ref Range   Ethylene Glycol Lvl <5  <5 mg/dL  GLUCOSE, CAPILLARY     Status: Abnormal   Collection Time    01/29/14  9:12 PM      Result Value Ref Range   Glucose-Capillary 137 (*) 70 - 99 mg/dL   Comment 1 Notify RN    GLUCOSE, CAPILLARY     Status: Abnormal   Collection Time    01/29/14 10:12 PM      Result Value Ref Range   Glucose-Capillary 146 (*) 70 - 99 mg/dL  GLUCOSE, CAPILLARY     Status: Abnormal   Collection Time    01/29/14 11:06 PM      Result Value Ref Range   Glucose-Capillary 158 (*) 70 - 99 mg/dL  BASIC METABOLIC PANEL     Status: Abnormal   Collection Time    01/29/14 11:20 PM      Result Value Ref Range   Sodium 143  137 - 147 mEq/L   Potassium 3.9  3.7 - 5.3 mEq/L   Chloride 107  96 - 112 mEq/L   CO2 20  19 - 32 mEq/L   Glucose, Bld 137 (*) 70 -  99 mg/dL   BUN 43 (*) 6 - 23 mg/dL   Creatinine, Ser 0.80  0.50 - 1.35 mg/dL   Calcium 8.0 (*) 8.4 - 10.5 mg/dL   GFR calc non Af Amer >90  >90 mL/min   GFR calc Af Amer >90  >90 mL/min   Comment: (NOTE)     The eGFR has been calculated using the CKD EPI equation.     This calculation has not been  validated in all clinical situations.     eGFR's persistently <90 mL/min signify possible Chronic Kidney     Disease.  GLUCOSE, CAPILLARY     Status: Abnormal   Collection Time    01/29/14 11:51 PM      Result Value Ref Range   Glucose-Capillary 141 (*) 70 - 99 mg/dL  GLUCOSE, CAPILLARY     Status: Abnormal   Collection Time    01/30/14  1:11 AM      Result Value Ref Range   Glucose-Capillary 129 (*) 70 - 99 mg/dL  BASIC METABOLIC PANEL     Status: Abnormal   Collection Time    01/30/14  2:00 AM      Result Value Ref Range   Sodium 144  137 - 147 mEq/L   Potassium 4.3  3.7 - 5.3 mEq/L   Chloride 107  96 - 112 mEq/L   CO2 22  19 - 32 mEq/L   Glucose, Bld 111 (*) 70 - 99 mg/dL   BUN 40 (*) 6 - 23 mg/dL   Creatinine, Ser 0.77  0.50 - 1.35 mg/dL   Calcium 8.1 (*) 8.4 - 10.5 mg/dL   GFR calc non Af Amer >90  >90 mL/min   GFR calc Af Amer >90  >90 mL/min   Comment: (NOTE)     The eGFR has been calculated using the CKD EPI equation.     This calculation has not been validated in all clinical situations.     eGFR's persistently <90 mL/min signify possible Chronic Kidney     Disease.  GLUCOSE, CAPILLARY     Status: Abnormal   Collection Time    01/30/14  2:05 AM      Result Value Ref Range   Glucose-Capillary 118 (*) 70 - 99 mg/dL  GLUCOSE, CAPILLARY     Status: Abnormal   Collection Time    01/30/14  3:05 AM      Result Value Ref Range   Glucose-Capillary 130 (*) 70 - 99 mg/dL  PROCALCITONIN     Status: None   Collection Time    01/30/14  4:50 AM      Result Value Ref Range   Procalcitonin 0.41     Comment:            Interpretation:     PCT (Procalcitonin) <= 0.5 ng/mL:     Systemic infection (sepsis) is not likely.     Local bacterial infection is possible.     (NOTE)             ICU PCT Algorithm               Non ICU PCT Algorithm        ----------------------------     ------------------------------             PCT < 0.25 ng/mL                 PCT < 0.1 ng/mL          Stopping  of antibiotics            Stopping of antibiotics           strongly encouraged.               strongly encouraged.        ----------------------------     ------------------------------           PCT level decrease by               PCT < 0.25 ng/mL           >= 80% from peak PCT           OR PCT 0.25 - 0.5 ng/mL          Stopping of antibiotics                                                 encouraged.         Stopping of antibiotics               encouraged.        ----------------------------     ------------------------------           PCT level decrease by              PCT >= 0.25 ng/mL           < 80% from peak PCT            AND PCT >= 0.5 ng/mL            Continuing antibiotics                                                  encouraged.           Continuing antibiotics                encouraged.        ----------------------------     ------------------------------         PCT level increase compared          PCT > 0.5 ng/mL             with peak PCT AND              PCT >= 0.5 ng/mL             Escalation of antibiotics                                              strongly encouraged.          Escalation of antibiotics            strongly encouraged.  CBC     Status: Abnormal   Collection Time    01/30/14  4:50 AM      Result Value Ref Range   WBC 4.7  4.0 - 10.5 K/uL   RBC 2.36 (*) 4.22 - 5.81 MIL/uL   Hemoglobin 7.7 (*) 13.0 - 17.0 g/dL   HCT 22.9 (*) 39.0 - 52.0 %   MCV 97.0  78.0 - 100.0 fL   MCH 32.6  26.0 - 34.0 pg   MCHC 33.6  30.0 - 36.0 g/dL   RDW 15.0  11.5 - 15.5 %   Platelets 134 (*) 150 - 400 K/uL  COMPREHENSIVE METABOLIC PANEL     Status: Abnormal   Collection Time    01/30/14  4:50 AM      Result Value Ref Range   Sodium 143  137 - 147 mEq/L   Potassium 3.9  3.7 - 5.3 mEq/L   Chloride 109  96 - 112 mEq/L   CO2 21  19 - 32 mEq/L   Glucose, Bld 138 (*) 70 - 99 mg/dL   BUN 38 (*) 6 - 23 mg/dL   Creatinine, Ser 0.72  0.50 - 1.35 mg/dL    Calcium 8.0 (*) 8.4 - 10.5 mg/dL   Total Protein 4.7 (*) 6.0 - 8.3 g/dL   Albumin 2.1 (*) 3.5 - 5.2 g/dL   AST 112 (*) 0 - 37 U/L   ALT 94 (*) 0 - 53 U/L   Alkaline Phosphatase 366 (*) 39 - 117 U/L   Total Bilirubin 0.7  0.3 - 1.2 mg/dL   GFR calc non Af Amer >90  >90 mL/min   GFR calc Af Amer >90  >90 mL/min   Comment: (NOTE)     The eGFR has been calculated using the CKD EPI equation.     This calculation has not been validated in all clinical situations.     eGFR's persistently <90 mL/min signify possible Chronic Kidney     Disease.  HEMOGLOBIN A1C     Status: Abnormal   Collection Time    01/30/14  4:50 AM      Result Value Ref Range   Hemoglobin A1C 10.8 (*) <5.7 %   Comment: (NOTE)                                                                               According to the ADA Clinical Practice Recommendations for 2011, when     HbA1c is used as a screening test:      >=6.5%   Diagnostic of Diabetes Mellitus               (if abnormal result is confirmed)     5.7-6.4%   Increased risk of developing Diabetes Mellitus     References:Diagnosis and Classification of Diabetes Mellitus,Diabetes     ZOXW,9604,54(UJWJX 1):S62-S69 and Standards of Medical Care in             Diabetes - 2011,Diabetes Care,2011,34 (Suppl 1):S11-S61.   Mean Plasma Glucose 263 (*) <117 mg/dL   Comment: Performed at [facility]  [facility]     Status: Abnormal   Collection Time    01/30/14  4:50 AM      Result Value Ref Range   Total CK 481 (*) 7 - 232 U/L  GLUCOSE, CAPILLARY     Status: Abnormal   Collection Time    01/30/14  8:30 AM      Result Value Ref Range   Glucose-Capillary 138 (*) 70 - 99 mg/dL   Comment 1 Documented in Chart  Comment 2 Notify RN    URINALYSIS, ROUTINE W REFLEX MICROSCOPIC     Status: Abnormal   Collection Time    01/30/14  8:39 AM      Result Value Ref Range   Color, Urine YELLOW  YELLOW   APPearance CLOUDY (*) CLEAR   Specific Gravity, Urine 1.021  1.005 -  1.030   pH 5.5  5.0 - 8.0   Glucose, UA 250 (*) NEGATIVE mg/dL   Hgb urine dipstick MODERATE (*) NEGATIVE   Bilirubin Urine NEGATIVE  NEGATIVE   Ketones, ur 15 (*) NEGATIVE mg/dL   Protein, ur NEGATIVE  NEGATIVE mg/dL   Urobilinogen, UA 1.0  0.0 - 1.0 mg/dL   Nitrite POSITIVE (*) NEGATIVE   Leukocytes, UA MODERATE (*) NEGATIVE  URINE MICROSCOPIC-ADD ON     Status: Abnormal   Collection Time    01/30/14  8:39 AM      Result Value Ref Range   Squamous Epithelial / LPF RARE  RARE   WBC, UA 21-50  <3 WBC/hpf   RBC / HPF 3-6  <3 RBC/hpf   Bacteria, UA MANY (*) RARE   Casts HYALINE CASTS (*) NEGATIVE   Crystals URIC ACID CRYSTALS (*) NEGATIVE   Urine-Other MUCOUS PRESENT    ACETAMINOPHEN LEVEL     Status: None   Collection Time    01/30/14 12:30 PM      Result Value Ref Range   Acetaminophen (Tylenol), Serum <15.0  10 - 30 ug/mL   Comment:            THERAPEUTIC CONCENTRATIONS VARY     SIGNIFICANTLY. A RANGE OF 10-30     ug/mL MAY BE AN EFFECTIVE     CONCENTRATION FOR MANY PATIENTS.     HOWEVER, SOME ARE BEST TREATED     AT CONCENTRATIONS OUTSIDE THIS     RANGE.     ACETAMINOPHEN CONCENTRATIONS     >150 ug/mL AT 4 HOURS AFTER     INGESTION AND >50 ug/mL AT 12     HOURS AFTER INGESTION ARE     OFTEN ASSOCIATED WITH TOXIC     REACTIONS.  GLUCOSE, CAPILLARY     Status: Abnormal   Collection Time    01/30/14 12:35 PM      Result Value Ref Range   Glucose-Capillary 112 (*) 70 - 99 mg/dL   Comment 1 Documented in Chart     Comment 2 Notify RN    COMPREHENSIVE METABOLIC PANEL     Status: Abnormal   Collection Time    01/30/14  1:22 PM      Result Value Ref Range   Sodium 143  137 - 147 mEq/L   Potassium 3.6 (*) 3.7 - 5.3 mEq/L   Chloride 109  96 - 112 mEq/L   CO2 20  19 - 32 mEq/L   Glucose, Bld 120 (*) 70 - 99 mg/dL   BUN 28 (*) 6 - 23 mg/dL   Creatinine, Ser 0.64  0.50 - 1.35 mg/dL   Calcium 8.1 (*) 8.4 - 10.5 mg/dL   Total Protein 5.1 (*) 6.0 - 8.3 g/dL   Albumin 2.2  (*) 3.5 - 5.2 g/dL   AST 111 (*) 0 - 37 U/L   ALT 92 (*) 0 - 53 U/L   Alkaline Phosphatase 372 (*) 39 - 117 U/L   Total Bilirubin 0.8  0.3 - 1.2 mg/dL   GFR calc non Af Amer >90  >90 mL/min   GFR calc Af  Amer >90  >90 mL/min   Comment: (NOTE)     The eGFR has been calculated using the CKD EPI equation.     This calculation has not been validated in all clinical situations.     eGFR's persistently <90 mL/min signify possible Chronic Kidney     Disease.  GLUCOSE, CAPILLARY     Status: Abnormal   Collection Time    01/30/14  5:48 PM      Result Value Ref Range   Glucose-Capillary 248 (*) 70 - 99 mg/dL  GLUCOSE, CAPILLARY     Status: Abnormal   Collection Time    01/30/14  7:45 PM      Result Value Ref Range   Glucose-Capillary 193 (*) 70 - 99 mg/dL   Comment 1 Documented in Chart     Comment 2 Notify RN    GLUCOSE, CAPILLARY     Status: Abnormal   Collection Time    01/30/14  9:33 PM      Result Value Ref Range   Glucose-Capillary 149 (*) 70 - 99 mg/dL   Comment 1 Notify RN    PROCALCITONIN     Status: None   Collection Time    01/31/14  3:00 AM      Result Value Ref Range   Procalcitonin 0.24     Comment:            Interpretation:     PCT (Procalcitonin) <= 0.5 ng/mL:     Systemic infection (sepsis) is not likely.     Local bacterial infection is possible.     (NOTE)             ICU PCT Algorithm               Non ICU PCT Algorithm        ----------------------------     ------------------------------             PCT < 0.25 ng/mL                 PCT < 0.1 ng/mL         Stopping of antibiotics            Stopping of antibiotics           strongly encouraged.               strongly encouraged.        ----------------------------     ------------------------------           PCT level decrease by               PCT < 0.25 ng/mL           >= 80% from peak PCT           OR PCT 0.25 - 0.5 ng/mL          Stopping of antibiotics                                                  encouraged.         Stopping of antibiotics               encouraged.        ----------------------------     ------------------------------           PCT level decrease  by              PCT >= 0.25 ng/mL           < 80% from peak PCT            AND PCT >= 0.5 ng/mL            Continuing antibiotics                                                  encouraged.           Continuing antibiotics                encouraged.        ----------------------------     ------------------------------         PCT level increase compared          PCT > 0.5 ng/mL             with peak PCT AND              PCT >= 0.5 ng/mL             Escalation of antibiotics                                              strongly encouraged.          Escalation of antibiotics            strongly encouraged.  CBC     Status: Abnormal   Collection Time    01/31/14  3:00 AM      Result Value Ref Range   WBC 4.0  4.0 - 10.5 K/uL   RBC 2.31 (*) 4.22 - 5.81 MIL/uL   Hemoglobin 7.5 (*) 13.0 - 17.0 g/dL   HCT 22.6 (*) 39.0 - 52.0 %   MCV 97.8  78.0 - 100.0 fL   MCH 32.5  26.0 - 34.0 pg   MCHC 33.2  30.0 - 36.0 g/dL   RDW 15.4  11.5 - 15.5 %   Platelets 133 (*) 150 - 400 K/uL  COMPREHENSIVE METABOLIC PANEL     Status: Abnormal   Collection Time    01/31/14  3:00 AM      Result Value Ref Range   Sodium 144  137 - 147 mEq/L   Potassium 3.1 (*) 3.7 - 5.3 mEq/L   Chloride 109  96 - 112 mEq/L   CO2 25  19 - 32 mEq/L   Glucose, Bld 161 (*) 70 - 99 mg/dL   BUN 22  6 - 23 mg/dL   Creatinine, Ser 0.62  0.50 - 1.35 mg/dL   Calcium 8.1 (*) 8.4 - 10.5 mg/dL   Total Protein 5.1 (*) 6.0 - 8.3 g/dL   Albumin 2.0 (*) 3.5 - 5.2 g/dL   AST 116 (*) 0 - 37 U/L   ALT 91 (*) 0 - 53 U/L   Alkaline Phosphatase 361 (*) 39 - 117 U/L   Total Bilirubin 0.6  0.3 - 1.2 mg/dL   GFR calc non Af Amer >90  >90 mL/min   GFR calc Af Amer >90  >90 mL/min   Comment: (NOTE)     The eGFR has  been calculated using the CKD EPI equation.     This  calculation has not been validated in all clinical situations.     eGFR's persistently <90 mL/min signify possible Chronic Kidney     Disease.  MAGNESIUM     Status: None   Collection Time    01/31/14  3:00 AM      Result Value Ref Range   Magnesium 2.0  1.5 - 2.5 mg/dL  PHOSPHORUS     Status: Abnormal   Collection Time    01/31/14  3:00 AM      Result Value Ref Range   Phosphorus 1.3 (*) 2.3 - 4.6 mg/dL  PROTIME-INR     Status: Abnormal   Collection Time    01/31/14  3:00 AM      Result Value Ref Range   Prothrombin Time 15.7 (*) 11.6 - 15.2 seconds   INR 1.28  0.00 - 1.49  GLUCOSE, CAPILLARY     Status: Abnormal   Collection Time    01/31/14  8:06 AM      Result Value Ref Range   Glucose-Capillary 133 (*) 70 - 99 mg/dL  GLUCOSE, CAPILLARY     Status: None   Collection Time    01/31/14 12:04 PM      Result Value Ref Range   Glucose-Capillary 73  70 - 99 mg/dL   Comment 1 Documented in Chart     Comment 2 Notify RN      Ct Chest Wo Contrast  01/31/2014   CLINICAL DATA:  Evaluate left lung opacity on chest radiograph  EXAM: CT CHEST WITHOUT CONTRAST  TECHNIQUE: Multidetector CT imaging of the chest was performed following the standard protocol without IV contrast.  COMPARISON:  Chest radiograph dated 01/30/2014  FINDINGS: Multifocal patchy opacities in the medial right middle lobe and lingula (series 5/ image 37), left lower lobe (series 5/ image 36), and right lower lobe (series 5/image 39). This appearance has likely progressed from the recent prior chest radiographs and suggests multifocal pneumonia, possibly on the basis of aspiration.  Associated small bilateral pleural effusions.  No pneumothorax.  Visualized thyroid is unremarkable.  The heart is normal in size. No pericardial effusion. Coronary atherosclerosis in the LAD. Atherosclerotic calcifications of the aortic arch.  No suspicious mediastinal or axillary lymphadenopathy.  Visualized upper abdomen is notable for vascular  calcifications in severe hepatic steatosis. Displaced, mildly comminuted left humeral neck fracture (series 2/image 3).  IMPRESSION: Suspected multifocal pneumonia, possibly on the basis of aspiration, likely progressed from recent chest radiographs.  Associated small bilateral pleural effusions.   Electronically Signed   By: Julian Hy M.D.   On: 01/31/2014 16:31   Dg Chest Port 1 View  01/30/2014   CLINICAL DATA:  Possible pneumonia  EXAM: PORTABLE CHEST - 1 VIEW  COMPARISON:  01/28/2014  FINDINGS: Cardiomediastinal silhouette is stable. No segmental infiltrate or pulmonary edema. Persistent spiculated density left infrahilar region. Again further evaluation with CT scan of the chest is recommended. Displaced fracture of left humerus again noted.  IMPRESSION: Persistent spiculated density left infrahilar region. Further evaluation with CT scan of the chest is recommended. Again noted displaced fracture of the left proximal humerus. No acute infiltrate or pulmonary edema.   Electronically Signed   By: Lahoma Crocker M.D.   On: 01/30/2014 08:29     Vitals Temp:  [97 F (36.1 C)-101.3 F (38.5 C)] 97.3 F (36.3 C) (02/26 1430) Pulse Rate:  [67-111] 83 (02/26 1430) Resp:  [  11-23] 16 (02/26 1430) BP: (90-175)/(46-110) 123/75 mmHg (02/26 1430) SpO2:  [91 %-100 %] 100 % (02/26 1430) Body mass index is 21.87 kg/(m^2).  Physical Exam: thin, disheveled WM, unable to communicate effectively but does respond to commands. Left shoulder diffusely swollen with evolving ecchymosis. Skin intact but humeral shaft prominent beneath anterior deltoid. Grossly N/V intact LUE. Able to move all digits.     Assessment/Plan: Impression:  Severely displaced left 2 part proximal humerus fracture  Multiple underlying medical co morbidities as listed above including ethanol abuse with recent withdrawal and associated profound metabolic derangement. Treatment: once underlying medical condition stabilized patient  would benefit from open reduction internal fixation of left shoulder fracture. Left alone it is unlikely to heal and would most likely leave severely compromised LUE function. Given overall health status, patient is at significant risk for perioperative morbidity/mortality, however, unless medicine teams feels as though condition absolutely precludes surgery, I would recommend surgical stabilization, likely early next week if overall status allows. Will follow. Continue sling immobilization to LUE, strict NWB LUE   Feliza Diven M 01/31/2014, 5:25 PM

## 2014-01-31 NOTE — Progress Notes (Signed)
PROGRESS NOTE   Benjamin [name] [id number] DOB: 1951/04/07 DOA: 01/28/2014 PCP: [name], MD  Assessment/Plan:  Left proximal humerus fracture - ortho consulted, appreciate input ETOH Abuse - On CIWA, withdrawing improving.  Left infrahilar density on CXR - will order a CT without contrast Acute encephalopathy - ETOH, elevated ammonia. Improving.  DKA - off insulin gtt, continue s.q. Metabolic acidosis - improving UTI - patient febrile 2/24-2/25 night, UA positive. Ceftriaxone. CXR clear, less likely aspiration PNA. Afebrile today.  DM - continue NPH and SSI PUD - continue PPI Rhabdo - CK trending down Anemia - no apparent active bleeding, monitor.  Pancreatitis - clinically improving, carb diet  Diet: carb modified Fluids: NS DVT Prophylaxis: SCD  Code Status: Full Family Communication: brother phone  Disposition Plan: inpatient  Consultants:  Orthopedic surgery  Procedures:  none   Antibiotics - Ceftriaxone  HPI/Subjective: - feeling better today, tremulousness improving  Objective: Filed Vitals:   01/31/14 0300 01/31/14 0400 01/31/14 0500 01/31/14 0600  BP:  118/75 92/57 99/57   Pulse: 93 92 80 67  Temp: 99.1 F (37.3 C) 99 F (37.2 C) 98.4 F (36.9 C) 97.7 F (36.5 C)  TempSrc:      Resp: 21 20 16 15   Height:      Weight:      SpO2: 97% 91% 95% 98%    Intake/Output Summary (Last 24 hours) at 01/31/14 0704 Last data filed at 01/31/14 0600  Gross per 24 hour  Intake   2640 ml  Output   1550 ml  Net   1090 ml   Filed Weights   01/29/14 0930  Weight: 67.2 kg (148 lb 2.4 oz)   Exam:  General:  NAD, disheveled  Cardiovascular: regular rate and rhythm, without MRG  Respiratory: good air movement, clear to auscultation throughout, no wheezing, ronchi or rales  Abdomen: soft, not tender to palpation, positive bowel sounds  MSK: no peripheral edema Neuro: non focal  Data Reviewed: Basic Metabolic Panel:  Recent Labs Lab  01/29/14 0220 01/29/14 0638  01/29/14 2320 01/30/14 0200 01/30/14 0450 01/30/14 1322 01/31/14 0300  NA 141 144  < > 143 144 143 143 144  K 4.4 4.1  < > 3.9 4.3 3.9 3.6* 3.1*  CL 95* 102  < > 107 107 109 109 109  CO2 <7* 13*  < > 20 22 21 20 25   GLUCOSE 396* 117*  < > 137* 111* 138* 120* 161*  BUN 61* 61*  < > 43* 40* 38* 28* 22  CREATININE 1.51* 1.38*  < > 0.80 0.77 0.72 0.64 0.62  CALCIUM 7.7* 8.3*  < > 8.0* 8.1* 8.0* 8.1* 8.1*  MG  --  2.1  --   --   --   --   --  2.0  PHOS  --  2.7  --   --   --   --   --  1.3*  < > = values in this interval not displayed.  Liver Function Tests:  Recent Labs Lab 01/28/14 2200 01/29/14 0638 01/30/14 0450 01/30/14 1322 01/31/14 0300  AST 359* 303* 112* 111* 116*  ALT 172* 139* 94* 92* 91*  ALKPHOS 436* 362* 366* 372* 361*  BILITOT 1.0 0.7 0.7 0.8 0.6  PROT 5.6* 5.0* 4.7* 5.1* 5.1*  ALBUMIN 2.5* 2.3* 2.1* 2.2* 2.0*    Recent Labs Lab 01/28/14 2200  LIPASE 238*    Recent Labs Lab 01/29/14 0430  AMMONIA 69*   CBC:  Recent Labs Lab  01/28/14 2200 01/29/14 0638 01/29/14 1355 01/30/14 0450 01/31/14 0300  WBC 6.1 6.0 5.0 4.7 4.0  HGB 9.0* 7.9* 8.0* 7.7* 7.5*  HCT 27.5* 23.5* 23.4* 22.9* 22.6*  MCV 98.9 96.3 96.3 97.0 97.8  PLT 252 207 149* 134* 133*   Cardiac Enzymes:  Recent Labs Lab 01/28/14 2200 01/29/14 0220 01/29/14 0831 01/29/14 1355 01/29/14 1838 01/30/14 0450  CKTOTAL 481*  --   --   --  620* 481*  TROPONINI  --  <0.30 <0.30 <0.30  --   --    CBG:  Recent Labs Lab 01/30/14 0830 01/30/14 1235 01/30/14 1748 01/30/14 1945 01/30/14 2133  GLUCAP 138* 112* 248* 193* 149*    Recent Results (from the past 240 hour(s))  MRSA PCR SCREENING     Status: None   Collection Time    01/29/14  9:09 AM      Result Value Ref Range Status   MRSA by PCR NEGATIVE  NEGATIVE Final   Comment:            The GeneXpert MRSA Assay (FDA     approved for NASAL specimens     only), is one component of a      comprehensive MRSA colonization     surveillance program. It is not     intended to diagnose MRSA     infection nor to guide or     monitor treatment for     MRSA infections.   Studies: Dg Chest Port 1 View  01/30/2014   CLINICAL DATA:  Possible pneumonia  EXAM: PORTABLE CHEST - 1 VIEW  COMPARISON:  01/28/2014  FINDINGS: Cardiomediastinal silhouette is stable. No segmental infiltrate or pulmonary edema. Persistent spiculated density left infrahilar region. Again further evaluation with CT scan of the chest is recommended. Displaced fracture of left humerus again noted.  IMPRESSION: Persistent spiculated density left infrahilar region. Further evaluation with CT scan of the chest is recommended. Again noted displaced fracture of the left proximal humerus. No acute infiltrate or pulmonary edema.   Electronically Signed   By: Lahoma Crocker M.D.   On: 01/30/2014 08:29   Scheduled Meds: . cefTRIAXone (ROCEPHIN)  IV  1 g Intravenous Q24H  . cloNIDine  0.1 mg Oral TID  . docusate sodium  100 mg Oral BID  . folic acid  1 mg Oral Daily  . insulin aspart  0-5 Units Subcutaneous QHS  . insulin aspart  0-9 Units Subcutaneous TID WC  . insulin NPH Human  6 Units Subcutaneous BID AC & HS  . lactulose  10 g Oral TID  . LORazepam  0-4 mg Intravenous Q4H  . pantoprazole (PROTONIX) IV  40 mg Intravenous Q12H  . potassium phosphate IVPB (mmol)  30 mmol Intravenous Once  . sodium chloride  3 mL Intravenous Q12H  . sucralfate  1 g Oral TID WC & HS  . thiamine  100 mg Intravenous Daily  . thiamine  100 mg Oral Daily   Continuous Infusions: . sodium chloride 1,000 mL (01/30/14 1900)  . insulin (NOVOLIN-R) infusion Stopped (01/30/14 2703)   Active Problems:   ETOH abuse   Diabetes mellitus   Anemia   PUD (peptic ulcer disease)   DKA (diabetic ketoacidoses)   Pancreatitis   Metabolic acidosis   Humerus fracture   Rhabdomyolysis   Pancreatitis, acute   Acute encephalopathy   UTI (urinary tract  infection)  Time spent: 35  This note has been created with Dragon speech recognition software and smart  Company secretary. Any transcriptional errors are unintentional.   Marzetta Board, MD [facility] Pager [phone]. If 7 PM - 7 AM, please contact night-coverage at [email or web address], password [facility] 01/31/2014, 7:04 AM  LOS: 3 days

## 2014-02-01 DIAGNOSIS — J690 Pneumonitis due to inhalation of food and vomit: Secondary | ICD-10-CM

## 2014-02-01 LAB — BASIC METABOLIC PANEL
BUN: 14 mg/dL (ref 6–23)
CHLORIDE: 106 meq/L (ref 96–112)
CO2: 20 mEq/L (ref 19–32)
Calcium: 7.8 mg/dL — ABNORMAL LOW (ref 8.4–10.5)
Creatinine, Ser: 0.55 mg/dL (ref 0.50–1.35)
GFR calc non Af Amer: >90 mL/min (ref >90)
Glucose, Bld: 87 mg/dL (ref 70–99)
Potassium: 4 mEq/L (ref 3.7–5.3)
Sodium: 141 mEq/L (ref 137–147)

## 2014-02-01 LAB — IRON AND TIBC
IRON: 26 ug/dL — AB (ref 42–135)
Saturation Ratios: 19 % — ABNORMAL LOW (ref 20–55)
TIBC: 135 ug/dL — ABNORMAL LOW (ref 215–435)
UIBC: 109 ug/dL — ABNORMAL LOW (ref 125–400)

## 2014-02-01 LAB — CBC
HCT: 22 % — ABNORMAL LOW (ref 39.0–52.0)
Hemoglobin: 7.3 g/dL — ABNORMAL LOW (ref 13.0–17.0)
MCH: 32.3 pg (ref 26.0–34.0)
MCHC: 33.2 g/dL (ref 30.0–36.0)
MCV: 97.3 fL (ref 78.0–100.0)
PLATELETS: 152 10*3/uL (ref 150–400)
RBC: 2.26 MIL/uL — ABNORMAL LOW (ref 4.22–5.81)
RDW: 15.9 % — ABNORMAL HIGH (ref 11.5–15.5)
WBC: 3.2 10*3/uL — AB (ref 4.0–10.5)

## 2014-02-01 LAB — GLUCOSE, CAPILLARY
GLUCOSE-CAPILLARY: 132 mg/dL — AB (ref 70–99)
Glucose-Capillary: 95 mg/dL (ref 70–99)
Glucose-Capillary: 70 mg/dL (ref 70–99)
Glucose-Capillary: 141 mg/dL — ABNORMAL HIGH (ref 70–99)
Glucose-Capillary: 239 mg/dL — ABNORMAL HIGH (ref 70–99)
Glucose-Capillary: 174 mg/dL — ABNORMAL HIGH (ref 70–99)

## 2014-02-01 LAB — FERRITIN: FERRITIN: 1408 ng/mL — AB (ref 22–322)

## 2014-02-01 LAB — ABO/RH: ABO/RH(D): O POS

## 2014-02-01 LAB — PREPARE RBC (CROSSMATCH)

## 2014-02-01 MED ORDER — GLUCERNA SHAKE PO LIQD
237.0000 mL | Freq: Two times a day (BID) | ORAL | Status: DC
  Administered 2014-02-01 – 2014-02-11 (×19): 237 mL via ORAL
  Filled 2014-02-01 (×24): qty 237

## 2014-02-01 MED ORDER — FERROUS SULFATE 325 (65 FE) MG PO TABS
325.0000 mg | ORAL_TABLET | Freq: Three times a day (TID) | ORAL | Status: DC
  Administered 2014-02-01 – 2014-02-11 (×29): 325 mg via ORAL
  Filled 2014-02-01 (×32): qty 1

## 2014-02-01 NOTE — Progress Notes (Signed)
INITIAL NUTRITION ASSESSMENT  DOCUMENTATION CODES Per approved criteria  -Not Applicable   INTERVENTION: - Glucerna Shake BID, 8 oz provides 220 kcal, 10 g protein - Encourage adequate intake  NUTRITION DIAGNOSIS: Inadequate oral intake related to decreased appetite as evidenced by <50% intake of meals.   Goal: Patient will meet >/=90% of estimated nutrition needs  Monitor:  PO intake, weight, labs  Reason for Assessment: Consult  63 y.o. male  Admitting Dx: Left proximal humerus fracture  ASSESSMENT: Patient with history of alcohol abuse, admitted after a fall. He was found to be hyperglycemic with metabolic acidosis. Patient reports that he drinks 4-5 glasses of wine a day. He reports that is appetite is fair, and has been so for a while. His weight is down 6% from his admission in July 2014.   Height: Ht Readings from Last 1 Encounters:  01/29/14 5\' 9"  (1.753 m)    Weight: Wt Readings from Last 1 Encounters:  01/29/14 148 lb 2.4 oz (67.2 kg)    Ideal Body Weight: 160 pounds  % Ideal Body Weight: 93%  Wt Readings from Last 10 Encounters:  01/29/14 148 lb 2.4 oz (67.2 kg)  06/20/13 157 lb 6.5 oz (71.4 kg)  06/20/13 157 lb 6.5 oz (71.4 kg)  07/11/12 137 lb 2 oz (62.2 kg)    Usual Body Weight: 157 pounds  % Usual Body Weight: 94%  BMI:  Body mass index is 21.87 kg/(m^2). Patient is normal weight.   Estimated Nutritional Needs: Kcal: 1700 kcal Protein: 80-95 g Fluid: >2.3 L/day  Skin: Stage 2 pressure ulcer sacrum, deep tissue injury heel.   Diet Order: Carb Control  EDUCATION NEEDS: -Education not appropriate at this time   Intake/Output Summary (Last 24 hours) at 02/01/14 1257 Last data filed at 02/01/14 0802  Gross per 24 hour  Intake    440 ml  Output   1250 ml  Net   -810 ml    Last BM: 2/27   Labs:   Recent Labs Lab 01/29/14 0220 01/29/14 0638  01/30/14 1322 01/31/14 0300 02/01/14 0400  NA 141 144  < > 143 144 141  K 4.4 4.1   < > 3.6* 3.1* 4.0  CL 95* 102  < > 109 109 106  CO2 <7* 13*  < > 20 25 20   BUN 61* 61*  < > 28* 22 14  CREATININE 1.51* 1.38*  < > 0.64 0.62 0.55  CALCIUM 7.7* 8.3*  < > 8.1* 8.1* 7.8*  MG  --  2.1  --   --  2.0  --   PHOS  --  2.7  --   --  1.3*  --   GLUCOSE 396* 117*  < > 120* 161* 87  < > = values in this interval not displayed.  CBG (last 3)   Recent Labs  01/31/14 1204 01/31/14 1741 01/31/14 2137  GLUCAP 73 88 95    Scheduled Meds: . cefTRIAXone (ROCEPHIN)  IV  1 g Intravenous Q24H  . cloNIDine  0.1 mg Oral TID  . docusate sodium  100 mg Oral BID  . folic acid  1 mg Oral Daily  . insulin aspart  0-5 Units Subcutaneous QHS  . insulin aspart  0-9 Units Subcutaneous TID WC  . insulin NPH Human  6 Units Subcutaneous BID AC & HS  . lactulose  10 g Oral TID  . LORazepam  0-4 mg Intravenous Q4H  . pantoprazole (PROTONIX) IV  40 mg Intravenous Q12H  .  sodium chloride  3 mL Intravenous Q12H  . sucralfate  1 g Oral TID WC & HS  . thiamine  100 mg Intravenous Daily  . thiamine  100 mg Oral Daily    Continuous Infusions: . sodium chloride 1,000 mL (02/01/14 0930)    Past Medical History  Diagnosis Date  . Diabetes mellitus   . Hypertension   . Alcohol abuse   . Pancreatitis     Past Surgical History  Procedure Laterality Date  . Knee surgery    . Pancreas surgery    . Esophagogastroduodenoscopy N/A 06/19/2013    Procedure: ESOPHAGOGASTRODUODENOSCOPY (EGD);  Surgeon: Cleotis Nipper, MD;  Location: [facility] ENDOSCOPY;  Service: Endoscopy;  Laterality: N/A;    Larey Seat, RD, LDN Pager #: [phone] After-Hours Pager #: [phone]

## 2014-02-01 NOTE — Progress Notes (Signed)
PROGRESS NOTE  Benjamin [name] [id number] DOB: 1951/08/26 DOA: 01/28/2014 PCP: [name], MD  Assessment/Plan:  Left proximal humerus fracture - ortho consulted, appreciate input. Patient needing a unit of blood today.  - from a medical standpoint he should be OK with surgery, he is a moderate risk, but currently improving. I suspect that he would be OK for surgery early next week, appreciate orthopedic surgery input about timing.  ETOH Abuse - On CIWA, withdrawing improving.  Left infrahilar density on CXR - CT without evidence of a mass, ?aspiration pneumonia. He is on Ceftriaxone for UTI and clinically improving.  Acute encephalopathy - ETOH, elevated ammonia. Improving.  DKA - off insulin gtt, continue s.q. Metabolic acidosis - improving UTI - patient febrile 2/24-2/25 night, UA positive. Ceftriaxone. DM - continue NPH and SSI PUD - continue PPI Rhabdo - CK improving.  Anemia - no apparent active bleeding, monitor. Hemoglobin slowly trending down to 7.3 this morning. Check iron studies. Check stool for occult blood.  Pancreatitis - clinically improving, carb diet  Diet: carb modified Fluids: NS DVT Prophylaxis: SCD  Code Status: Full Family Communication: brother phone  Disposition Plan: inpatient  Consultants:  Orthopedic surgery  Procedures:  none   Antibiotics - Ceftriaxone  HPI/Subjective: - feeling better today, tremulousness improving, more alert.   Objective: Filed Vitals:   02/01/14 0600 02/01/14 1123 02/01/14 1145 02/01/14 1245  BP: 150/80 157/74 156/80 150/90  Pulse: 103 92 90 99  Temp: 98 F (36.7 C) 97.6 F (36.4 C) 98 F (36.7 C) 98.3 F (36.8 C)  TempSrc: Oral Oral Oral Oral  Resp: 14 14 16 16   Height:      Weight:      SpO2: 99% 98% 99% 100%    Intake/Output Summary (Last 24 hours) at 02/01/14 1451 Last data filed at 02/01/14 1312  Gross per 24 hour  Intake    360 ml  Output   1050 ml  Net   -690 ml   Filed Weights   01/29/14  0930  Weight: 67.2 kg (148 lb 2.4 oz)   Exam:  General:  NAD, disheveled  Cardiovascular: regular rate and rhythm, without MRG  Respiratory: good air movement, clear to auscultation throughout, no wheezing, ronchi or rales  Abdomen: soft, not tender to palpation, positive bowel sounds  MSK: no peripheral edema Neuro: non focal  Data Reviewed: Basic Metabolic Panel:  Recent Labs Lab 01/29/14 0220 01/29/14 0638  01/30/14 0200 01/30/14 0450 01/30/14 1322 01/31/14 0300 02/01/14 0400  NA 141 144  < > 144 143 143 144 141  K 4.4 4.1  < > 4.3 3.9 3.6* 3.1* 4.0  CL 95* 102  < > 107 109 109 109 106  CO2 <7* 13*  < > 22 21 20 25 20   GLUCOSE 396* 117*  < > 111* 138* 120* 161* 87  BUN 61* 61*  < > 40* 38* 28* 22 14  CREATININE 1.51* 1.38*  < > 0.77 0.72 0.64 0.62 0.55  CALCIUM 7.7* 8.3*  < > 8.1* 8.0* 8.1* 8.1* 7.8*  MG  --  2.1  --   --   --   --  2.0  --   PHOS  --  2.7  --   --   --   --  1.3*  --   < > = values in this interval not displayed.  Liver Function Tests:  Recent Labs Lab 01/28/14 2200 01/29/14 0638 01/30/14 0450 01/30/14 1322 01/31/14 0300  AST 359* 303*  112* 111* 116*  ALT 172* 139* 94* 92* 91*  ALKPHOS 436* 362* 366* 372* 361*  BILITOT 1.0 0.7 0.7 0.8 0.6  PROT 5.6* 5.0* 4.7* 5.1* 5.1*  ALBUMIN 2.5* 2.3* 2.1* 2.2* 2.0*    Recent Labs Lab 01/28/14 2200  LIPASE 238*    Recent Labs Lab 01/29/14 0430  AMMONIA 69*   CBC:  Recent Labs Lab 01/29/14 0638 01/29/14 1355 01/30/14 0450 01/31/14 0300 02/01/14 0400  WBC 6.0 5.0 4.7 4.0 3.2*  HGB 7.9* 8.0* 7.7* 7.5* 7.3*  HCT 23.5* 23.4* 22.9* 22.6* 22.0*  MCV 96.3 96.3 97.0 97.8 97.3  PLT 207 149* 134* 133* 152   Cardiac Enzymes:  Recent Labs Lab 01/28/14 2200 01/29/14 0220 01/29/14 0831 01/29/14 1355 01/29/14 1838 01/30/14 0450  CKTOTAL 481*  --   --   --  620* 50*  TROPONINI  --  <0.30 <0.30 <0.30  --   --    CBG:  Recent Labs Lab 01/30/14 2133 01/31/14 0806 01/31/14 1204  01/31/14 1741 01/31/14 2137  GLUCAP 149* 133* 73 88 95    Recent Results (from the past 240 hour(s))  MRSA PCR SCREENING     Status: None   Collection Time    01/29/14  9:09 AM      Result Value Ref Range Status   MRSA by PCR NEGATIVE  NEGATIVE Final   Comment:            The GeneXpert MRSA Assay (FDA     approved for NASAL specimens     only), is one component of a     comprehensive MRSA colonization     surveillance program. It is not     intended to diagnose MRSA     infection nor to guide or     monitor treatment for     MRSA infections.   Studies: Ct Chest Wo Contrast  01/31/2014   CLINICAL DATA:  Evaluate left lung opacity on chest radiograph  EXAM: CT CHEST WITHOUT CONTRAST  TECHNIQUE: Multidetector CT imaging of the chest was performed following the standard protocol without IV contrast.  COMPARISON:  Chest radiograph dated 01/30/2014  FINDINGS: Multifocal patchy opacities in the medial right middle lobe and lingula (series 5/ image 37), left lower lobe (series 5/ image 36), and right lower lobe (series 5/image 39). This appearance has likely progressed from the recent prior chest radiographs and suggests multifocal pneumonia, possibly on the basis of aspiration.  Associated small bilateral pleural effusions.  No pneumothorax.  Visualized thyroid is unremarkable.  The heart is normal in size. No pericardial effusion. Coronary atherosclerosis in the LAD. Atherosclerotic calcifications of the aortic arch.  No suspicious mediastinal or axillary lymphadenopathy.  Visualized upper abdomen is notable for vascular calcifications in severe hepatic steatosis. Displaced, mildly comminuted left humeral neck fracture (series 2/image 3).  IMPRESSION: Suspected multifocal pneumonia, possibly on the basis of aspiration, likely progressed from recent chest radiographs.  Associated small bilateral pleural effusions.   Electronically Signed   By: Julian Hy M.D.   On: 01/31/2014 16:31    Scheduled Meds: . cefTRIAXone (ROCEPHIN)  IV  1 g Intravenous Q24H  . cloNIDine  0.1 mg Oral TID  . docusate sodium  100 mg Oral BID  . feeding supplement (GLUCERNA SHAKE)  237 mL Oral BID BM  . folic acid  1 mg Oral Daily  . insulin aspart  0-5 Units Subcutaneous QHS  . insulin aspart  0-9 Units Subcutaneous TID WC  . insulin  NPH Human  6 Units Subcutaneous BID AC & HS  . lactulose  10 g Oral TID  . LORazepam  0-4 mg Intravenous Q4H  . pantoprazole (PROTONIX) IV  40 mg Intravenous Q12H  . sodium chloride  3 mL Intravenous Q12H  . sucralfate  1 g Oral TID WC & HS  . thiamine  100 mg Intravenous Daily  . thiamine  100 mg Oral Daily   Continuous Infusions: . sodium chloride 1,000 mL (02/01/14 0930)   Active Problems:   ETOH abuse   Diabetes mellitus   Anemia   PUD (peptic ulcer disease)   DKA (diabetic ketoacidoses)   Pancreatitis   Metabolic acidosis   Humerus fracture   Rhabdomyolysis   Pancreatitis, acute   Acute encephalopathy   UTI (urinary tract infection)  Time spent: 35  This note has been created with Surveyor, quantity. Any transcriptional errors are unintentional.   Marzetta Board, MD [facility] Pager [phone]. If 7 PM - 7 AM, please contact night-coverage at [email or web address], password [facility] 02/01/2014, 2:51 PM  LOS: 4 days

## 2014-02-01 NOTE — Care Management Note (Addendum)
    Page 1 of 2   02/11/2014     4:28:52 PM   CARE MANAGEMENT NOTE 02/11/2014  Patient:  DUQUAN, GILLOOLY   Account Number:  [id number]  Date Initiated:  01/29/2014  Documentation initiated by:  DAVIS,RHONDA  Subjective/Objective Assessment:   pt with hx of dka and etoh abuse, found down in the home, fell sustaining left humeral fracture of the head/ glucose in the 400's, acidotic by abgs, labs positive for pancreatitis     Action/Plan:   will follow for needs and development of etoh w/d   Anticipated DC Date:  02/11/2014   Anticipated DC Plan:  SKILLED NURSING FACILITY  In-house referral  Clinical Social Worker      DC Planning Services  CM consult      Choice offered to / List presented to:  NA   DME arranged  NA      DME agency  NA     [place] arranged  NA      [place] agency  NA   Status of service:  Completed, signed off Medicare Important Message given?  NA - LOS <3 / Initial given by admissions (If response is "NO", the following Medicare IM given date fields will be blank) Date Medicare IM given:   Date Additional Medicare IM given:    Discharge Disposition:  [place]  Per UR Regulation:  Reviewed for med. necessity/level of care/duration of stay  If discussed at [place] of Stay Meetings, dates discussed:   02/05/2014  02/07/2014    Comments:  02/11/14 Keymiah Lyles RN,BSN NCM [phone] D/C SNF.  02/07/14 Othal Kubitz RN,BSN NCM [phone] MED STABLE FOR D/C.D/C SNF.  02/05/14 Kyshawn Teal RN,BSN NCM [phone] S/P ORIF L PROX HUM FX.ORTHO FOLLOWING.CLEAR LIQ DIET.WOULD RECOMMEND PT/OT CONS WHEN APPROPRIATE.D/C PLAN SNF.  02/04/14 Keyry Iracheta RN,BSN NCM [phone] FOR ORIF L PROX HUM FX IN AM.POST SX RECOMMEND PT/OT CONS.D/C PLAN LIKELY SNF.  02/01/14 Rourke Mcquitty RN,BSN NCM [phone] TRANSFER FROM SDU.DKA,ACUTE PANCREATITIS,ACUTE ENCEPHALOPATHY,UTI.IVF@100 ,IV ABX,IV PROTONIX,CIWA PROTOCAL.ORTHO FOLLOWING FOR L PROX HUM FX-FOR ORIF ONCE MEDICAL ISSUES  ADDRESSED.RD-NUTRITION-GLUCERNA.WILL AWAIT POST SX FOR D/C NEEDS.  [id number]/TRVUYE Rosana Hoes, RN, BSN, [place] [phone] Chart Reviewed for discharge and hospital needs. Discharge needs at time of review:  None present will follow for needs. Review of patient progress due on [id number].

## 2014-02-01 NOTE — Progress Notes (Signed)
Currently following patient for his left proximal humerus fracture. According to medicine note patient is stabilizing and should be at moderate surgical risk for ORIF by early next week.  We are currently planning for surgery at 7:30 am Tuesday 02/05/14 and will follow up on Monday for preop orders and to see patient. Please call through weekend for any issues.  [name] [facility]

## 2014-02-01 NOTE — Evaluation (Signed)
Physical Therapy Evaluation Patient Details Name: Benjamin [name] MRN: [id number] DOB: [birth year] Today's Date: 02/01/2014 Time: 0100-7121 PT Time Calculation (min): 17 min  PT Assessment / Plan / Recommendation History of Present Illness  63 yo male admitted with DKA, fall suffering L prox humerus fx. Hx of ETOH abuse, DM, HTN. Pt lives alone per chart.   Clinical Impression  On eval, pt required Max assist (+2) for all mobility. Attempted static standing at EOB-pt very unsteady and was unable to reach full upright standing posture with 2 attempts. Recommend SNF for continued rehab.     PT Assessment  Patient needs continued PT services    Follow Up Recommendations  SNF    Does the patient have the potential to tolerate intense rehabilitation      Barriers to Discharge        Equipment Recommendations   (to be determined)    Recommendations for Other Services OT consult   Frequency Min 3X/week    Precautions / Restrictions Precautions Precautions: Fall Required Braces or Orthoses: Sling (L UE) Restrictions Weight Bearing Restrictions: Yes LUE Weight Bearing: Non weight bearing   Pertinent Vitals/Pain L UE 8/10      Mobility  Bed Mobility Overal bed mobility: Needs Assistance Bed Mobility: Supine to Sit;Sit to Supine Supine to sit: +2 for safety/equipment;+2 for physical assistance;Mod assist Sit to supine: +2 for safety/equipment;+2 for physical assistance;Max assist General bed mobility comments: Assist for trunk and bil LEs. Increased time. Utilized bed pad for scooting to EOB Transfers Overall transfer level: Needs assistance Transfers: Sit to/from Stand Sit to Stand: Max assist;+2 physical assistance;+2 safety/equipment;From elevated surface General transfer comment: x 2. Pt only able to reach ~75% of full standing posture. Wide BOS. Only able to stand for ~5 seconds.  Ambulation/Gait General Gait Details: Not able at time of eval    Exercises     PT  Diagnosis: Difficulty walking;Generalized weakness;Acute pain  PT Problem List: Decreased strength;Decreased activity tolerance;Decreased balance;Decreased mobility;Decreased knowledge of use of DME;Pain PT Treatment Interventions: DME instruction;Gait training;Functional mobility training;Therapeutic activities;Therapeutic exercise;Patient/family education;Balance training     PT Goals(Current goals can be found in the care plan section) Acute Rehab PT Goals Patient Stated Goal: none stated Time For Goal Achievement: 02/15/14 Potential to Achieve Goals: Good  Visit Information  Last PT Received On: 02/01/14 Assistance Needed: +2 History of Present Illness: 63 yo male admitted with DKA, fall suffering L prox humerus fx. Hx of ETOH abuse, DM, HTN. Pt lives alone per chart.        Prior Functioning  Home Living Family/patient expects to be discharged to:: Unsure Living Arrangements: Alone Type of Home: House Home Access: Level entry Home Layout: One level Home Equipment: Environmental consultant - 2 wheels Prior Function Level of Independence: Independent Communication Communication: No difficulties    Cognition  Cognition Arousal/Alertness: Awake/alert Behavior During Therapy: Flat affect Overall Cognitive Status: Within Functional Limits for tasks assessed    Extremity/Trunk Assessment Upper Extremity Assessment Upper Extremity Assessment: Defer to OT evaluation Lower Extremity Assessment Lower Extremity Assessment: Generalized weakness Cervical / Trunk Assessment Cervical / Trunk Assessment: Normal   Balance Balance Overall balance assessment: History of Falls;Needs assistance Sitting-balance support: Single extremity supported;Feet supported Sitting balance-Leahy Scale: Fair Standing balance support: Single extremity supported Standing balance-Leahy Scale: Zero  End of Session PT - End of Session Equipment Utilized During Treatment: Gait belt Activity Tolerance: Patient limited by  fatigue;Patient limited by pain Patient left: with call bell/phone within reach;in bed;with bed alarm  set  GP     Weston Anna, MPT Pager: [phone]

## 2014-02-01 NOTE — Progress Notes (Signed)
Clinical Social Work Department BRIEF PSYCHOSOCIAL ASSESSMENT 02/01/2014  Patient:  BAKARI, NIKOLAI     Account Number:  [id number]     Admit date:  01/28/2014  Clinical Social Worker:  Venia Minks  Date/Time:  02/01/2014 12:00 M  Referred by:  RN  Date Referred:  02/01/2014 Referred for  SNF Placement   Other Referral:   Interview type:  Family Other interview type:    PSYCHOSOCIAL DATA Living Status:  ALONE Admitted from facility:   Level of care:   Primary support name:  Shoichi Mielke Primary support relationship to patient:  SIBLING Degree of support available:   fair    CURRENT CONCERNS Current Concerns  Post-Acute Placement   Other Concerns:    SOCIAL WORK ASSESSMENT / PLAN Patient is inappropriate to participate in assessment at this time. Patient's brother requested call from [place]. CSW called patient's brother, Collier Salina, he is concerned about discharge disposition. Patient is scheduled for shoulder surgery tuesday and peter thinks patient will need some type of snf. He states that patient lives alone and he is concerned about his ability to care for himself. he states that their mother had been at [facility] so he is familiar with the snf process. he is concerned about his brothers poor habits and inability to care for himself. CSW offered emotional support.   Assessment/plan status:   Other assessment/ plan:   Information/referral to community resources:    PATIENT'S/FAMILY'S RESPONSE TO PLAN OF CARE: Brother is requesting patient be faxed out and recieve bed offers following his procedure tuesday morning. CSW will assist with same. He is hopeful that this time period will help his brother recooperate and be able to get well.

## 2014-02-02 DIAGNOSIS — I10 Essential (primary) hypertension: Secondary | ICD-10-CM | POA: Diagnosis present

## 2014-02-02 LAB — BASIC METABOLIC PANEL
BUN: 8 mg/dL (ref 6–23)
CALCIUM: 8 mg/dL — AB (ref 8.4–10.5)
CO2: 22 mEq/L (ref 19–32)
Chloride: 97 mEq/L (ref 96–112)
Creatinine, Ser: 0.48 mg/dL — ABNORMAL LOW (ref 0.50–1.35)
GLUCOSE: 170 mg/dL — AB (ref 70–99)
POTASSIUM: 3.2 meq/L — AB (ref 3.7–5.3)
Sodium: 132 mEq/L — ABNORMAL LOW (ref 137–147)

## 2014-02-02 LAB — GLUCOSE, CAPILLARY
GLUCOSE-CAPILLARY: 155 mg/dL — AB (ref 70–99)
Glucose-Capillary: 205 mg/dL — ABNORMAL HIGH (ref 70–99)

## 2014-02-02 LAB — URINE CULTURE
Colony Count: NO GROWTH
Culture: NO GROWTH

## 2014-02-02 LAB — CBC
HCT: 27.5 % — ABNORMAL LOW (ref 39.0–52.0)
HEMOGLOBIN: 9.3 g/dL — AB (ref 13.0–17.0)
MCH: 31.6 pg (ref 26.0–34.0)
MCHC: 33.8 g/dL (ref 30.0–36.0)
MCV: 93.5 fL (ref 78.0–100.0)
Platelets: 162 10*3/uL (ref 150–400)
RBC: 2.94 MIL/uL — ABNORMAL LOW (ref 4.22–5.81)
RDW: 16.5 % — AB (ref 11.5–15.5)
WBC: 2.9 10*3/uL — ABNORMAL LOW (ref 4.0–10.5)

## 2014-02-02 MED ORDER — LISINOPRIL 10 MG PO TABS
10.0000 mg | ORAL_TABLET | Freq: Every day | ORAL | Status: DC
  Administered 2014-02-02 – 2014-02-04 (×3): 10 mg via ORAL
  Filled 2014-02-02 (×4): qty 1

## 2014-02-02 MED ORDER — POTASSIUM CHLORIDE CRYS ER 20 MEQ PO TBCR
40.0000 meq | EXTENDED_RELEASE_TABLET | Freq: Once | ORAL | Status: AC
  Administered 2014-02-02: 40 meq via ORAL
  Filled 2014-02-02: qty 2

## 2014-02-02 MED ORDER — PANTOPRAZOLE SODIUM 40 MG PO TBEC
40.0000 mg | DELAYED_RELEASE_TABLET | Freq: Every day | ORAL | Status: DC
  Administered 2014-02-03 – 2014-02-11 (×8): 40 mg via ORAL
  Filled 2014-02-02 (×9): qty 1

## 2014-02-02 NOTE — Progress Notes (Signed)
Patient's blood pressure i the 170s asymptomatic,  notified Dr Cruzita Lederer and he added lisinopril. Will continue to monitor patient.

## 2014-02-02 NOTE — Progress Notes (Signed)
PHARMACIST - PHYSICIAN COMMUNICATION CONCERNING:  IV to Oral Route Change Policy  RECOMMENDATION: This patient is receiving Protonix by the intravenous route.  Based on criteria approved by the Pharmacy and Therapeutics Committee, Protonix is being converted to the equivalent oral dose form(s).  DESCRIPTION: These criteria include:  Has a documented ability to take oral medications (tolerating diet of full liquids or better or  Gastric tube feedings for >24 hours OR taking other scheduled oral medications for >24 hours).  Expected plan for continued treatment for at least 1 day.  If you have questions about this conversion, please contact the Pharmacy Department  []   [phone] )  [facility] []   [phone] )  [facility]  []   [phone] )  [facility] [x]   [phone] )  [place], PharmD, [place] Pager: [phone] 2:08 PM Pharmacy #: [id number]

## 2014-02-02 NOTE — Progress Notes (Signed)
PROGRESS NOTE  Benjamin [name] OVZ:[id number] DOB: Jun 10, 1951 DOA: 01/28/2014 PCP: [name], MD  Assessment/Plan:  Left proximal humerus fracture - ortho consulted, appreciate input. Patient needing a unit of blood today.  - from a medical standpoint he should be OK with surgery, he is a moderate risk, but currently improving. I suspect that he would be OK for surgery early next week, appreciate orthopedic surgery input about timing.  ETOH Abuse - On CIWA, withdrawing improving.  Left infrahilar density on CXR - CT without evidence of a mass, ?aspiration pneumonia. He is on Ceftriaxone for UTI and clinically improving.  Acute encephalopathy - ETOH, elevated ammonia. Improving.  - Continue lactulose Hypertension - blood pressure trends upwards, start lisinopril 10 mg and monitor. DKA - off insulin gtt, continue s.q. Metabolic acidosis - improving UTI - patient febrile 2/24-2/25 night, UA positive. Ceftriaxone. DM - continue NPH and SSI PUD - continue PPI Rhabdo - CK improving.  Anemia - no apparent active bleeding, monitor. Hemoglobin slowly trending down to 7.3 on 2/27. Status post one unit transfusion. Hemoglobin this morning up to 9.3. Pancreatitis - clinically improving, carb diet  Diet: carb modified Fluids: NS DVT Prophylaxis: SCD  Code Status: Full Family Communication: brother in the room Disposition Plan: inpatient  Consultants:  Orthopedic surgery  Procedures:  none   Antibiotics - Ceftriaxone  HPI/Subjective: - feeling better today, tremulousness resolved  Objective: Filed Vitals:   02/01/14 1500 02/01/14 1818 02/01/14 2051 02/02/14 0500  BP: 166/84 138/80 166/84 170/83  Pulse: 86 84 85 100  Temp:   97.7 F (36.5 C) 98 F (36.7 C)  TempSrc:   Oral Oral  Resp:   16 18  Height:      Weight:      SpO2:   100% 100%    Intake/Output Summary (Last 24 hours) at 02/02/14 0737 Last data filed at 02/02/14 0645  Gross per 24 hour  Intake 2018.33 ml  Output    2300 ml  Net -281.67 ml   Filed Weights   01/29/14 0930  Weight: 67.2 kg (148 lb 2.4 oz)   Exam:  General:  NAD, disheveled  Cardiovascular: regular rate and rhythm, without MRG  Respiratory: good air movement, clear to auscultation throughout, no wheezing, ronchi or rales  Abdomen: soft, not tender to palpation, positive bowel sounds  MSK: no peripheral edema Neuro: non focal  Data Reviewed: Basic Metabolic Panel:  Recent Labs Lab 01/29/14 0220 01/29/14 0638  01/30/14 0450 01/30/14 1322 01/31/14 0300 02/01/14 0400 02/02/14 0527  NA 141 144  < > 143 143 144 141 132*  K 4.4 4.1  < > 3.9 3.6* 3.1* 4.0 3.2*  CL 95* 102  < > 109 109 109 106 97  CO2 <7* 13*  < > 21 20 25 20 22   GLUCOSE 396* 117*  < > 138* 120* 161* 87 170*  BUN 61* 61*  < > 38* 28* 22 14 8   CREATININE 1.51* 1.38*  < > 0.72 0.64 0.62 0.55 0.48*  CALCIUM 7.7* 8.3*  < > 8.0* 8.1* 8.1* 7.8* 8.0*  MG  --  2.1  --   --   --  2.0  --   --   PHOS  --  2.7  --   --   --  1.3*  --   --   < > = values in this interval not displayed.  Liver Function Tests:  Recent Labs Lab 01/28/14 2200 01/29/14 0638 01/30/14 0450 01/30/14 1322 01/31/14 0300  AST 359* 303* 112* 111* 116*  ALT 172* 139* 94* 92* 91*  ALKPHOS 436* 362* 366* 372* 361*  BILITOT 1.0 0.7 0.7 0.8 0.6  PROT 5.6* 5.0* 4.7* 5.1* 5.1*  ALBUMIN 2.5* 2.3* 2.1* 2.2* 2.0*    Recent Labs Lab 01/28/14 2200  LIPASE 238*    Recent Labs Lab 01/29/14 0430  AMMONIA 69*   CBC:  Recent Labs Lab 01/29/14 1355 01/30/14 0450 01/31/14 0300 02/01/14 0400 02/02/14 0527  WBC 5.0 4.7 4.0 3.2* 2.9*  HGB 8.0* 7.7* 7.5* 7.3* 9.3*  HCT 23.4* 22.9* 22.6* 22.0* 27.5*  MCV 96.3 97.0 97.8 97.3 93.5  PLT 149* 134* 133* 152 162   Cardiac Enzymes:  Recent Labs Lab 01/28/14 2200 01/29/14 0220 01/29/14 0831 01/29/14 1355 01/29/14 1838 01/30/14 0450  CKTOTAL 481*  --   --   --  620* 481*  TROPONINI  --  <0.30 <0.30 <0.30  --   --     CBG:  Recent Labs Lab 02/01/14 0721 02/01/14 0907 02/01/14 1117 02/01/14 1722 02/01/14 2049  GLUCAP 70 132* 141* 239* 174*    Recent Results (from the past 240 hour(s))  MRSA PCR SCREENING     Status: None   Collection Time    01/29/14  9:09 AM      Result Value Ref Range Status   MRSA by PCR NEGATIVE  NEGATIVE Final   Comment:            The GeneXpert MRSA Assay (FDA     approved for NASAL specimens     only), is one component of a     comprehensive MRSA colonization     surveillance program. It is not     intended to diagnose MRSA     infection nor to guide or     monitor treatment for     MRSA infections.  URINE CULTURE     Status: None   Collection Time    02/01/14  7:01 AM      Result Value Ref Range Status   Specimen Description URINE, CLEAN CATCH   Final   Special Requests NONE   Final   Culture  Setup Time     Final   Value: 02/01/2014 11:24     Performed at [place]     Final   Value: NO GROWTH     Performed at [facility]   Culture     Final   Value: NO GROWTH     Performed at [facility]   Report Status 02/02/2014 FINAL   Final   Studies: Ct Chest Wo Contrast  01/31/2014   CLINICAL DATA:  Evaluate left lung opacity on chest radiograph  EXAM: CT CHEST WITHOUT CONTRAST  TECHNIQUE: Multidetector CT imaging of the chest was performed following the standard protocol without IV contrast.  COMPARISON:  Chest radiograph dated 01/30/2014  FINDINGS: Multifocal patchy opacities in the medial right middle lobe and lingula (series 5/ image 37), left lower lobe (series 5/ image 36), and right lower lobe (series 5/image 39). This appearance has likely progressed from the recent prior chest radiographs and suggests multifocal pneumonia, possibly on the basis of aspiration.  Associated small bilateral pleural effusions.  No pneumothorax.  Visualized thyroid is unremarkable.  The heart is normal in size. No pericardial effusion.  Coronary atherosclerosis in the LAD. Atherosclerotic calcifications of the aortic arch.  No suspicious mediastinal or axillary lymphadenopathy.  Visualized upper abdomen is notable  for vascular calcifications in severe hepatic steatosis. Displaced, mildly comminuted left humeral neck fracture (series 2/image 3).  IMPRESSION: Suspected multifocal pneumonia, possibly on the basis of aspiration, likely progressed from recent chest radiographs.  Associated small bilateral pleural effusions.   Electronically Signed   By: Julian Hy M.D.   On: 01/31/2014 16:31   Scheduled Meds: . cefTRIAXone (ROCEPHIN)  IV  1 g Intravenous Q24H  . cloNIDine  0.1 mg Oral TID  . docusate sodium  100 mg Oral BID  . feeding supplement (GLUCERNA SHAKE)  237 mL Oral BID BM  . ferrous sulfate  325 mg Oral TID WC  . folic acid  1 mg Oral Daily  . insulin aspart  0-5 Units Subcutaneous QHS  . insulin aspart  0-9 Units Subcutaneous TID WC  . insulin NPH Human  6 Units Subcutaneous BID AC & HS  . lactulose  10 g Oral TID  . LORazepam  0-4 mg Intravenous Q4H  . pantoprazole (PROTONIX) IV  40 mg Intravenous Q12H  . sodium chloride  3 mL Intravenous Q12H  . sucralfate  1 g Oral TID WC & HS  . thiamine  100 mg Intravenous Daily  . thiamine  100 mg Oral Daily   Continuous Infusions: . sodium chloride 1,000 mL (02/01/14 2309)   Active Problems:   ETOH abuse   Diabetes mellitus   Anemia   PUD (peptic ulcer disease)   DKA (diabetic ketoacidoses)   Pancreatitis   Metabolic acidosis   Humerus fracture   Rhabdomyolysis   Pancreatitis, acute   Acute encephalopathy   UTI (urinary tract infection)   Aspiration pneumonia  Time spent: 25  This note has been created with Surveyor, quantity. Any transcriptional errors are unintentional.   Marzetta Board, MD [facility] Pager [phone]. If 7 PM - 7 AM, please contact night-coverage at [email or web address], password  [facility] 02/02/2014, 7:37 AM  LOS: 5 days

## 2014-02-02 NOTE — Progress Notes (Signed)
OT Cancellation Note  Patient Details Name: Shermar Friedland MRN: [id number] DOB: 06-13-1951   Cancelled Treatment:    Reason Eval/Treat Not Completed: Other (comment) Pt sleeping when OT arrived but did arouse enough to talk to OT. He requests being able to sleep right now as he hasnt been sleeping well. Will try back later time.  Jules Schick [phone] 02/02/2014, 11:25 AM

## 2014-02-03 ENCOUNTER — Inpatient Hospital Stay (HOSPITAL_COMMUNITY): Payer: BC Managed Care – PPO

## 2014-02-03 LAB — COMPREHENSIVE METABOLIC PANEL
ALBUMIN: 1.9 g/dL — AB (ref 3.5–5.2)
ALT: 46 U/L (ref 0–53)
AST: 29 U/L (ref 0–37)
Alkaline Phosphatase: 258 U/L — ABNORMAL HIGH (ref 39–117)
BILIRUBIN TOTAL: 0.4 mg/dL (ref 0.3–1.2)
BUN: 8 mg/dL (ref 6–23)
CHLORIDE: 95 meq/L — AB (ref 96–112)
CO2: 24 meq/L (ref 19–32)
Calcium: 7.7 mg/dL — ABNORMAL LOW (ref 8.4–10.5)
Creatinine, Ser: 0.49 mg/dL — ABNORMAL LOW (ref 0.50–1.35)
GFR calc Af Amer: >90 mL/min (ref >90)
Glucose, Bld: 116 mg/dL — ABNORMAL HIGH (ref 70–99)
POTASSIUM: 3.2 meq/L — AB (ref 3.7–5.3)
Sodium: 131 mEq/L — ABNORMAL LOW (ref 137–147)
Total Protein: 4.6 g/dL — ABNORMAL LOW (ref 6.0–8.3)

## 2014-02-03 LAB — GLUCOSE, CAPILLARY
GLUCOSE-CAPILLARY: 130 mg/dL — AB (ref 70–99)
GLUCOSE-CAPILLARY: 133 mg/dL — AB (ref 70–99)
Glucose-Capillary: 180 mg/dL — ABNORMAL HIGH (ref 70–99)
Glucose-Capillary: 106 mg/dL — ABNORMAL HIGH (ref 70–99)
Glucose-Capillary: 141 mg/dL — ABNORMAL HIGH (ref 70–99)

## 2014-02-03 LAB — CBC
HCT: 26.7 % — ABNORMAL LOW (ref 39.0–52.0)
Hemoglobin: 9.5 g/dL — ABNORMAL LOW (ref 13.0–17.0)
MCH: 32.5 pg (ref 26.0–34.0)
MCHC: 35.6 g/dL (ref 30.0–36.0)
MCV: 91.4 fL (ref 78.0–100.0)
PLATELETS: 198 10*3/uL (ref 150–400)
RBC: 2.92 MIL/uL — ABNORMAL LOW (ref 4.22–5.81)
RDW: 15.7 % — AB (ref 11.5–15.5)
WBC: 3 10*3/uL — ABNORMAL LOW (ref 4.0–10.5)

## 2014-02-03 LAB — PHOSPHORUS: PHOSPHORUS: 2.1 mg/dL — AB (ref 2.3–4.6)

## 2014-02-03 LAB — MAGNESIUM: MAGNESIUM: 1.5 mg/dL (ref 1.5–2.5)

## 2014-02-03 MED ORDER — SODIUM CHLORIDE 0.9 % IJ SOLN
10.0000 mL | Freq: Two times a day (BID) | INTRAMUSCULAR | Status: DC
  Administered 2014-02-07: 10 mL

## 2014-02-03 MED ORDER — POTASSIUM CHLORIDE CRYS ER 20 MEQ PO TBCR
40.0000 meq | EXTENDED_RELEASE_TABLET | Freq: Two times a day (BID) | ORAL | Status: AC
  Administered 2014-02-03 (×2): 40 meq via ORAL
  Filled 2014-02-03 (×2): qty 2

## 2014-02-03 MED ORDER — LACTULOSE 10 GM/15ML PO SOLN
10.0000 g | Freq: Every day | ORAL | Status: DC
  Administered 2014-02-04 – 2014-02-11 (×6): 10 g via ORAL
  Filled 2014-02-03 (×8): qty 15

## 2014-02-03 MED ORDER — SODIUM CHLORIDE 0.9 % IJ SOLN
10.0000 mL | INTRAMUSCULAR | Status: DC | PRN
  Administered 2014-02-04 – 2014-02-11 (×16): 10 mL

## 2014-02-03 NOTE — Progress Notes (Signed)
Peripherally Inserted Central Catheter/Midline Placement  The IV Nurse has discussed with the patient and/or persons authorized to consent for the patient, the purpose of this procedure and the potential benefits and risks involved with this procedure.  The benefits include less needle sticks, lab draws from the catheter and patient may be discharged home with the catheter.  Risks include, but not limited to, infection, bleeding, blood clot (thrombus formation), and puncture of an artery; nerve damage and irregular heat beat.  Alternatives to this procedure were also discussed.  PICC/Midline Placement Documentation  PICC / Midline Double Lumen 88/32/54 PICC Right Basilic 45 cm 0 cm (Active)  Indication for Insertion or Continuance of Line Poor Vasculature-patient has had multiple peripheral attempts or PIVs lasting less than 24 hours 02/03/2014  4:41 PM  Exposed Catheter (cm) 0 cm 02/03/2014  4:41 PM  Lumen #1 Status Flushed;Saline locked;Blood return noted 02/03/2014  4:41 PM  Lumen #2 Status Flushed;Saline locked;Blood return noted 02/03/2014  4:41 PM  Dressing Intervention New dressing 02/03/2014  4:41 PM  Dressing Change Due 02/10/14 02/03/2014  4:41 PM       [facility] 02/03/2014, 4:42 PM

## 2014-02-03 NOTE — Progress Notes (Signed)
PROGRESS NOTE  [name] FIE:[id number] DOB: 07/10/1951 DOA: 01/28/2014 PCP: [name], MD  Assessment/Plan:  Left proximal humerus fracture - ortho consulted, appreciate input. Patient needing a unit of blood 2/28.  - from a medical standpoint he should be OK with surgery early next week, he is a moderate risk, but currently improving. ETOH Abuse - On CIWA, withdrawing resolved.  Left infrahilar density on CXR - CT without evidence of a mass, ?aspiration pneumonia. CT scan  is more convincing for pneumonia, continue ceftriaxone for 7 day course. Acute encephalopathy - ETOH, elevated ammonia. Improving.  - Continue lactulose, decreasing to once a day his mental status has improved significantly Hypertension - blood pressure trends upwards, started lisinopril 10 mg on 2/28. - Improved this morning DKA - off insulin gtt, continue s.q. Metabolic acidosis - improving UTI - patient febrile 2/24-2/25 night, UA positive. Ceftriaxone. Urine cultures negative DM - continue NPH and SSI PUD - continue PPI Rhabdo - CK improving.  Anemia - no apparent active bleeding, monitor. Hemoglobin slowly trending down to 7.3 on 2/27. Status post one unit transfusion. Hemoglobin improved and has remained stable. Pancreatitis - clinically improving, carb diet  Diet: carb modified Fluids: NS DVT Prophylaxis: SCD  Code Status: Full Family Communication: None this morning Disposition Plan: inpatient  Consultants:  Orthopedic surgery  Procedures:  none   Antibiotics - Ceftriaxone 2/25>>   HPI/Subjective: - Continues to feel well   Objective: Filed Vitals:   02/02/14 1327 02/02/14 1550 02/02/14 2042 02/03/14 0516  BP: 175/94 142/78 129/69 148/86  Pulse: 97  79 87  Temp: 98.1 F (36.7 C)  98.2 F (36.8 C) 98.6 F (37 C)  TempSrc: Oral  Oral Oral  Resp: 16  18 18   Height:      Weight:      SpO2: 99%  98% 98%    Intake/Output Summary (Last 24 hours) at 02/03/14 0755 Last data filed  at 02/03/14 0517  Gross per 24 hour  Intake   3160 ml  Output   1100 ml  Net   2060 ml   Filed Weights   01/29/14 0930  Weight: 67.2 kg (148 lb 2.4 oz)   Exam:  General:  NAD, disheveled  Cardiovascular: regular rate and rhythm, without MRG  Respiratory: good air movement, clear to auscultation throughout, no wheezing, ronchi or rales  Abdomen: soft, not tender to palpation, positive bowel sounds  MSK: no peripheral edema Neuro: non focal  Data Reviewed: Basic Metabolic Panel:  Recent Labs Lab 01/29/14 0220 01/29/14 0638  01/30/14 1322 01/31/14 0300 02/01/14 0400 02/02/14 0527 02/03/14 0551  NA 141 144  < > 143 144 141 132* 131*  K 4.4 4.1  < > 3.6* 3.1* 4.0 3.2* 3.2*  CL 95* 102  < > 109 109 106 97 95*  CO2 <7* 13*  < > 20 25 20 22 24   GLUCOSE 396* 117*  < > 120* 161* 87 170* 116*  BUN 61* 61*  < > 28* 22 14 8 8   CREATININE 1.51* 1.38*  < > 0.64 0.62 0.55 0.48* 0.49*  CALCIUM 7.7* 8.3*  < > 8.1* 8.1* 7.8* 8.0* 7.7*  MG  --  2.1  --   --  2.0  --   --  1.5  PHOS  --  2.7  --   --  1.3*  --   --  2.1*  < > = values in this interval not displayed.  Liver Function Tests:  Recent Labs Lab  01/29/14 0638 01/30/14 0450 01/30/14 1322 01/31/14 0300 02/03/14 0551  AST 303* 112* 111* 116* 29  ALT 139* 94* 92* 91* 46  ALKPHOS 362* 366* 372* 361* 258*  BILITOT 0.7 0.7 0.8 0.6 0.4  PROT 5.0* 4.7* 5.1* 5.1* 4.6*  ALBUMIN 2.3* 2.1* 2.2* 2.0* 1.9*    Recent Labs Lab 01/28/14 2200  LIPASE 238*    Recent Labs Lab 01/29/14 0430  AMMONIA 69*   CBC:  Recent Labs Lab 01/30/14 0450 01/31/14 0300 02/01/14 0400 02/02/14 0527 02/03/14 0551  WBC 4.7 4.0 3.2* 2.9* 3.0*  HGB 7.7* 7.5* 7.3* 9.3* 9.5*  HCT 22.9* 22.6* 22.0* 27.5* 26.7*  MCV 97.0 97.8 97.3 93.5 91.4  PLT 134* 133* 152 162 198   Cardiac Enzymes:  Recent Labs Lab 01/28/14 2200 01/29/14 0220 01/29/14 0831 01/29/14 1355 01/29/14 1838 01/30/14 0450  CKTOTAL 481*  --   --   --  620* 481*    TROPONINI  --  <0.30 <0.30 <0.30  --   --    CBG:  Recent Labs Lab 02/01/14 2049 02/02/14 0803 02/02/14 1319 02/02/14 2040 02/03/14 0703  GLUCAP 174* 155* 205* 130* 106*    Recent Results (from the past 240 hour(s))  MRSA PCR SCREENING     Status: None   Collection Time    01/29/14  9:09 AM      Result Value Ref Range Status   MRSA by PCR NEGATIVE  NEGATIVE Final   Comment:            The GeneXpert MRSA Assay (FDA     approved for NASAL specimens     only), is one component of a     comprehensive MRSA colonization     surveillance program. It is not     intended to diagnose MRSA     infection nor to guide or     monitor treatment for     MRSA infections.  URINE CULTURE     Status: None   Collection Time    02/01/14  7:01 AM      Result Value Ref Range Status   Specimen Description URINE, CLEAN CATCH   Final   Special Requests NONE   Final   Culture  Setup Time     Final   Value: 02/01/2014 11:24     Performed at [place]     Final   Value: NO GROWTH     Performed at [facility]   Culture     Final   Value: NO GROWTH     Performed at [facility]   Report Status 02/02/2014 FINAL   Final   Studies: No results found. Scheduled Meds: . cefTRIAXone (ROCEPHIN)  IV  1 g Intravenous Q24H  . cloNIDine  0.1 mg Oral TID  . docusate sodium  100 mg Oral BID  . feeding supplement (GLUCERNA SHAKE)  237 mL Oral BID BM  . ferrous sulfate  325 mg Oral TID WC  . folic acid  1 mg Oral Daily  . insulin aspart  0-5 Units Subcutaneous QHS  . insulin aspart  0-9 Units Subcutaneous TID WC  . insulin NPH Human  6 Units Subcutaneous BID AC & HS  . lactulose  10 g Oral TID  . lisinopril  10 mg Oral Daily  . LORazepam  0-4 mg Intravenous Q4H  . pantoprazole  40 mg Oral Daily  . potassium chloride  40 mEq Oral BID  .  sodium chloride  3 mL Intravenous Q12H  . sucralfate  1 g Oral TID WC & HS  . thiamine  100 mg Oral Daily   Continuous  Infusions: . sodium chloride 1,000 mL (02/02/14 2306)   Active Problems:   ETOH abuse   Diabetes mellitus   Anemia   PUD (peptic ulcer disease)   DKA (diabetic ketoacidoses)   Pancreatitis   Metabolic acidosis   Humerus fracture   Rhabdomyolysis   Pancreatitis, acute   Acute encephalopathy   UTI (urinary tract infection)   Aspiration pneumonia   Essential hypertension, benign  Time spent: 25  This note has been created with Surveyor, quantity. Any transcriptional errors are unintentional.   Marzetta Board, MD [facility] Pager [phone]. If 7 PM - 7 AM, please contact night-coverage at [email or web address], password [facility] 02/03/2014, 7:55 AM  LOS: 6 days

## 2014-02-03 NOTE — Evaluation (Signed)
Occupational Therapy Evaluation Patient Details Name: Benjamin [name] MRN: [id number] DOB: 1951-08-15 Today's Date: 02/03/2014 Time: 9629-5284 OT Time Calculation (min): 33 min  OT Assessment / Plan / Recommendation History of present illness 63 yo male admitted with DKA, fall suffering L prox humerus fx. Hx of ETOH abuse, DM, HTN. Pt lives alone per chart.    Clinical Impression   Pt with new sling don due to current sling soiled. Pt with ace wrap applied to keep LT UE adducted to core. Pt with noted RT UE edema at iv site. Pt progressed to chair level this session. Pt is high risk for skin break down. Pt with decr caregiver support and requires SNF upon d/c. Ot to follow acutely. Please write orders for LT UE s/p surg for any additional ROM recommendations. Pt with full body tremors- question baseline vs lab value changes in chart.    OT Assessment  Patient needs continued OT Services    Follow Up Recommendations  SNF;Supervision/Assistance - 24 hour    Barriers to Discharge Decreased caregiver support    Equipment Recommendations  3 in 1 bedside comode;Wheelchair (measurements OT);Wheelchair cushion (measurements OT);Hospital bed (DEFER TO SNF)    Recommendations for Other Services    Frequency  Min 2X/week    Precautions / Restrictions Precautions Precautions: Fall Precaution Comments: NO ROM LT UE - pending surg Monday or tuesday for repair Required Braces or Orthoses: Sling Restrictions Weight Bearing Restrictions: Yes LUE Weight Bearing: Non weight bearing Other Position/Activity Restrictions: NO ROM- pt with pending surg    Pertinent Vitals/Pain 7 out 10 at this time    ADL  Grooming: Wash/dry hands;Wash/dry face;Maximal assistance Where Assessed - Grooming: Supported sitting Upper Body Dressing: Maximal assistance Where Assessed - Upper Body Dressing: Supported sitting Toilet Transfer: +2 Total assistance Toilet Transfer: Patient Percentage: 40% Armed forces technical officer Method:  Arts development officer: Therapist, occupational and Hygiene: +1 Total assistance Where Assessed - Camera operator Manipulation and Hygiene: Sit on 3-in-1 or toilet Equipment Used: Other (comment) (2 person (A) and sling on LT UE) Transfers/Ambulation Related to ADLs: Pt assisted to EOB. Pt with ace wrap don around sling to keep LT UE adducted to body and prevent ROM. Pt with sit<>Stand facilitation for upright posture at sternum and sacrum. Pt responding to tactile facilitation. second tech hand placement for anterior weight shift and RT UE . Pt with strong posterior lean and bracign bil LE against bed surface.  ADL Comments: Pt able to verbalize reason for admission and LT UE injuries. Pt aware of pending surg. Pt with full body tremor. Pt reports tremors at baselien. pt noted to have decr sodium values and creatine. Question baseline or could be due to lab value changes / etoh withdrawal protocol as well. brother unable to confirm if tremor same or worse. Pt with dizziness with sit <>stand. pt required x2 attempts to transfer to chair. pt with decr activity tolerance at this time. pillows positioned to help with edema management bil Ue. pt educated on hand opening /closing for edema. Pt educated on ankle pumps. Pt s brother present for education. Pt and brother educated pt must change positions every two hours to prevent skin break down in chair.    OT Diagnosis: Generalized weakness;Cognitive deficits;Acute pain  OT Problem List: Decreased strength;Decreased range of motion;Decreased activity tolerance;Impaired balance (sitting and/or standing);Decreased coordination;Decreased cognition;Decreased safety awareness;Decreased knowledge of use of DME or AE;Decreased knowledge of precautions;Impaired sensation;Impaired UE functional use;Pain;Increased edema OT Treatment Interventions:  Self-care/ADL training;Therapeutic exercise;DME and/or AE instruction;Therapeutic  activities;Cognitive remediation/compensation;Patient/family education;Balance training   OT Goals(Current goals can be found in the care plan section) Acute Rehab OT Goals Patient Stated Goal: none stated at this time OT Goal Formulation: With patient/family Time For Goal Achievement: 02/17/14 Potential to Achieve Goals: Good  Visit Information  Last OT Received On: 02/03/14 Assistance Needed: +2 History of Present Illness: 63 yo male admitted with DKA, fall suffering L prox humerus fx. Hx of ETOH abuse, DM, HTN. Pt lives alone per chart.        Prior [place] expects to be discharged to:: Private residence Living Arrangements: Alone Available Help at Discharge: Family;Available PRN/intermittently Type of Home: Apartment Home Access: Stairs to enter Entrance Stairs-Number of Steps: 1 curb step Home Layout: One level Home Equipment: Walker - 2 wheels Additional Comments: Pt drives daily but is unemployed at this time Prior Function Level of Independence: Independent Communication Communication: No difficulties Dominant Hand: Right         Vision/Perception Vision - Assessment Vision Assessment: Vision not tested Perception Perception: Impaired Comments: pt is unable to verbalize posterior LOB at EOB. Pt with poor awareness and decr sensation in bil LE   Cognition  Cognition Arousal/Alertness: Awake/alert Behavior During Therapy: Flat affect Overall Cognitive Status: Impaired/Different from baseline Area of Impairment: Safety/judgement;Awareness Safety/Judgement: Decreased awareness of deficits Awareness: Anticipatory General Comments: Pt oriented x4 during session, able to provided house hold setup and brother present to confirm    Extremity/Trunk Assessment Upper Extremity Assessment Upper Extremity Assessment: LUE deficits/detail LUE Deficits / Details: sling- pending surg Lower Extremity Assessment Lower Extremity  Assessment: Defer to PT evaluation (decr sensation in feet due to DM- recently assessed by MD) Cervical / Trunk Assessment Cervical / Trunk Assessment: Normal     Mobility Bed Mobility Overal bed mobility: Needs Assistance;+2 for physical assistance;+ 2 for safety/equipment Bed Mobility: Supine to Sit Supine to sit: Min assist;+2 for physical assistance;+2 for safety/equipment;HOB elevated General bed mobility comments: cues for sequence and guarding LT UE  Transfers Overall transfer level: Needs assistance Equipment used: 2 person hand held assist Transfers: Stand Pivot Transfers;Sit to/from Stand Sit to Stand: +2 physical assistance;+2 safety/equipment;Mod assist Stand pivot transfers: Max assist;+2 physical assistance;+2 safety/equipment General transfer comment: pt static standing ~10 seconds reports dizziness. Pt with edema Rt UE and poor reading of  dianmap on LT LE at this time. therapist progressing patient based on symptoms. Symptoms resolved. Pt progressed to char with (A) to swing hips. pt with incr dizziness with attempt. Pt reclined in recliner and no symptoms.     Exercise     Balance Balance Overall balance assessment: Needs assistance;History of Falls Sitting balance-Leahy Scale: Zero Postural control: Posterior lean   End of Session OT - End of Session Activity Tolerance: Patient limited by fatigue Patient left: in chair;with call bell/phone within reach;with family/visitor present Nurse Communication: Mobility status;Precautions;Weight bearing status  GO   Sling cleaned and placed in shower to dry at this time. Pt with new gown don supine this session.   Parke Poisson B 02/03/2014, 12:20 PM Pager: [phone]

## 2014-02-03 NOTE — Progress Notes (Signed)
OT NOTE  Pt noted to have incr edema in Rt UE on arrival with IV placed in forearm. Pt with Rt side external jugular IV with sling strap pulling at site. RN called to room to stop IV and check edema. Pt must wear sling due to LT UE humerus fx with pending surg. SLING TO BE DON AT ALL TIME. Pt is high risk for d/c or improper positioning of external jugular IV due to sling proper positioning. RN aware.    Jeri Modena   OTR/L Pager: [phone] Office: [phone] .

## 2014-02-04 LAB — TYPE AND SCREEN
ABO/RH(D): O POS
ANTIBODY SCREEN: NEGATIVE
UNIT DIVISION: 0

## 2014-02-04 LAB — CBC
HEMATOCRIT: 27.3 % — AB (ref 39.0–52.0)
Hemoglobin: 9.5 g/dL — ABNORMAL LOW (ref 13.0–17.0)
MCH: 32.2 pg (ref 26.0–34.0)
MCHC: 34.8 g/dL (ref 30.0–36.0)
MCV: 92.5 fL (ref 78.0–100.0)
PLATELETS: 302 10*3/uL (ref 150–400)
RBC: 2.95 MIL/uL — ABNORMAL LOW (ref 4.22–5.81)
RDW: 15.8 % — ABNORMAL HIGH (ref 11.5–15.5)
WBC: 4.2 10*3/uL (ref 4.0–10.5)

## 2014-02-04 LAB — BASIC METABOLIC PANEL
BUN: 9 mg/dL (ref 6–23)
CALCIUM: 7.8 mg/dL — AB (ref 8.4–10.5)
CO2: 23 mEq/L (ref 19–32)
Chloride: 99 mEq/L (ref 96–112)
Creatinine, Ser: 0.47 mg/dL — ABNORMAL LOW (ref 0.50–1.35)
GFR calc Af Amer: >90 mL/min (ref >90)
GLUCOSE: 202 mg/dL — AB (ref 70–99)
Potassium: 3.8 mEq/L (ref 3.7–5.3)
Sodium: 132 mEq/L — ABNORMAL LOW (ref 137–147)

## 2014-02-04 LAB — GLUCOSE, CAPILLARY
GLUCOSE-CAPILLARY: 228 mg/dL — AB (ref 70–99)
GLUCOSE-CAPILLARY: 152 mg/dL — AB (ref 70–99)
Glucose-Capillary: 201 mg/dL — ABNORMAL HIGH (ref 70–99)
Glucose-Capillary: 142 mg/dL — ABNORMAL HIGH (ref 70–99)
Glucose-Capillary: 152 mg/dL — ABNORMAL HIGH (ref 70–99)

## 2014-02-04 MED ORDER — ALUM & MAG HYDROXIDE-SIMETH 200-200-20 MG/5ML PO SUSP
30.0000 mL | Freq: Four times a day (QID) | ORAL | Status: DC | PRN
  Administered 2014-02-04: 30 mL via ORAL
  Filled 2014-02-04: qty 30

## 2014-02-04 MED ORDER — TEMAZEPAM 15 MG PO CAPS
30.0000 mg | ORAL_CAPSULE | Freq: Every evening | ORAL | Status: DC | PRN
  Administered 2014-02-04 – 2014-02-07 (×3): 30 mg via ORAL
  Filled 2014-02-04 (×3): qty 2

## 2014-02-04 MED ORDER — CHLORHEXIDINE GLUCONATE 4 % EX LIQD
60.0000 mL | Freq: Once | CUTANEOUS | Status: AC
  Administered 2014-02-05: 4 via TOPICAL
  Filled 2014-02-04 (×2): qty 60

## 2014-02-04 NOTE — Progress Notes (Signed)
Benjamin [name]  MRN: [id number] DOB/Age: 07-05-1951 63 y.o. Physician: Rada Hay Procedure: Procedure(s) (LRB): OPEN REDUCTION INTERNAL FIXATION (ORIF) LEFT  PROXIMAL HUMERUS FRACTURE (Left)     Subjective: Awake and alert. Appears much improved and asking appropriate questions. Requests sleeping pill tonight  Vital Signs Temp:  [97.5 F (36.4 C)-98.7 F (37.1 C)] 97.5 F (36.4 C) (03/02 1435) Pulse Rate:  [81-109] 82 (03/02 1435) Resp:  [20] 20 (03/02 1435) BP: (144-185)/(70-97) 185/92 mmHg (03/02 1435) SpO2:  [99 %-100 %] 100 % (03/02 1435)  Lab Results  Recent Labs  02/03/14 0551 02/04/14 1425  WBC 3.0* 4.2  HGB 9.5* 9.5*  HCT 26.7* 27.3*  PLT 198 302   BMET  Recent Labs  02/03/14 0551 02/04/14 1425  NA 131* 132*  K 3.2* 3.8  CL 95* 99  CO2 24 23  GLUCOSE 116* 202*  BUN 8 9  CREATININE 0.49* 0.47*  CALCIUM 7.7* 7.8*   INR  Date Value Ref Range Status  01/31/2014 1.28  0.00 - 1.49 Final     Exam Left upper extremity with obvious bruising and swelling but skin intact NVI distally       Plan To OR tomorrow am Restoril ordered for sleep prn  Janvi Ammar for Dr.Kevin Supple 02/04/2014, 5:08 PM

## 2014-02-04 NOTE — Progress Notes (Signed)
Occupational Therapy Treatment Patient Details Name: Benjamin [name] MRN: [id number] DOB: [birth year] Today's Date: 02/04/2014 Time: 0867-6195 OT Time Calculation (min): 30 min  OT Assessment / Plan / Recommendation  History of present illness 63 yo male admitted with DKA, fall suffering L prox humerus fx. Hx of ETOH abuse, DM, HTN. Pt lives alone per chart.    OT comments  Needed encourgement. surg tomorrow  Follow Up Recommendations  SNF;Supervision/Assistance - 24 hour       Equipment Recommendations  3 in 1 bedside comode;Wheelchair (measurements OT);Wheelchair cushion (measurements OT);Hospital bed (DEFER TO SNF)       Frequency Min 2X/week   Progress towards OT Goals Progress towards OT goals: Progressing toward goals     Precautions / Restrictions Precautions Precautions: Fall Precaution Comments: NO ROM LT UE - pending surg Monday or tuesday for repair Required Braces or Orthoses: Sling Restrictions Weight Bearing Restrictions: Yes LUE Weight Bearing: Non weight bearing Other Position/Activity Restrictions: NO ROM- pt with pending surg        ADL  Grooming: Wash/dry hands;Wash/dry face;Maximal assistance Where Assessed - Grooming: Supine, head of bed up ADL Comments: OT educated pt on elevating RUE to decrease edema as well as ROM.  Pt needed VC to perform.      OT Goals(current goals can now be found in the care plan section) Acute Rehab OT Goals Patient Stated Goal: none stated at this time OT Goal Formulation: With patient/family Time For Goal Achievement: 02/17/14 Potential to Achieve Goals: Good  Visit Information  Last OT Received On: 02/04/14 Assistance Needed: +2 History of Present Illness: 63 yo male admitted with DKA, fall suffering L prox humerus fx. Hx of ETOH abuse, DM, HTN. Pt lives alone per chart.           Cognition  Cognition Arousal/Alertness: Awake/alert Behavior During Therapy: WFL for tasks assessed/performed Overall Cognitive Status:  Within Functional Limits for tasks assessed    Mobility  Bed Mobility Overal bed mobility: Needs Assistance Bed Mobility: Supine to Sit Supine to sit: HOB elevated;Mod assist General bed mobility comments: cues for sequence and guarding LT UE  Transfers Overall transfer level: Needs assistance Transfers: Sit to/from Stand;Stand Pivot Transfers Sit to Stand: +2 safety/equipment;+2 physical assistance;From elevated surface;Mod assist Stand pivot transfers: Mod assist;+2 physical assistance;+2 safety/equipment General transfer comment: Highly elevated surface. Assist to rise, stabilize, control descent. Used hemi-walker. VCS safety, posture, technique, distance from walker.     Exercises  General Exercises - Lower Extremity Long Arc Quad: AROM;Both;10 reps;Seated Hip Flexion/Marching: AAROM;Both;10 reps;Seated      End of Session OT - End of Session Activity Tolerance: Patient limited by fatigue Patient left: in chair;with call bell/phone within reach;with family/visitor present Nurse Communication: Mobility status;Precautions;Weight bearing status  GO     [name] 02/04/2014, 11:23 AM

## 2014-02-04 NOTE — Progress Notes (Signed)
CSW met with patient. Patient is now alert and oriented X3. CSW discussed snf placement with patient. He is in agreement that he will not be able to manage at home right away and will need some type of assistance. He is agreeable to being faxed out and finding out what type of snf is available. He is a bit upset at not being able to manage on his own.  Benjamin [name] MSW, [place]

## 2014-02-04 NOTE — Progress Notes (Signed)
Occupational Therapy Treatment Patient Details Name: Benjamin [name] MRN: [id number] DOB: 1951/05/04 Today's Date: 02/04/2014 Time: 6010-9323 OT Time Calculation (min): 10 min  OT Assessment / Plan / Recommendation  History of present illness 63 yo male admitted with DKA, fall suffering L prox humerus fx. Hx of ETOH abuse, DM, HTN. Pt lives alone per chart.    OT comments  Session focused on edema RUE   Follow Up Recommendations  SNF;Supervision/Assistance - 24 hour       Equipment Recommendations  3 in 1 bedside comode;Wheelchair (measurements OT);Wheelchair cushion (measurements OT);Hospital bed       Frequency Min 2X/week   Progress towards OT Goals Progress towards OT goals: Progressing toward goals     Precautions / Restrictions Precautions Precautions: Fall Precaution Comments: NO ROM LT UE - pending surg Monday or tuesday for repair Required Braces or Orthoses: Sling Restrictions Weight Bearing Restrictions: Yes LUE Weight Bearing: Non weight bearing Other Position/Activity Restrictions: NO ROM- pt with pending surg        ADL  Grooming: Wash/dry hands;Wash/dry face;Maximal assistance Where Assessed - Grooming: Supine, head of bed up ADL Comments: 2nd session to address edema.  RUe was elevated when OT arrived and edema improved.  Pt did perform 10 reps - ( 3 sets) of RUE shoulder flexion, as well as elbow flexion/extension, wrist and hand.  Swelling much improved.        OT Goals(current goals can now be found in the care plan section) Acute Rehab OT Goals Patient Stated Goal: none stated at this time OT Goal Formulation: With patient/family Time For Goal Achievement: 02/17/14 Potential to Achieve Goals: Good  Visit Information  Last OT Received On: 02/04/14 Assistance Needed: +2 History of Present Illness: 63 yo male admitted with DKA, fall suffering L prox humerus fx. Hx of ETOH abuse, DM, HTN. Pt lives alone per chart.           Cognition   Cognition Arousal/Alertness: Awake/alert Behavior During Therapy: WFL for tasks assessed/performed Overall Cognitive Status: Within Functional Limits for tasks assessed    Mobility  Bed Mobility Overal bed mobility: Needs Assistance Bed Mobility: Supine to Sit Supine to sit: HOB elevated;Mod assist General bed mobility comments: cues for sequence and guarding LT UE  Transfers Overall transfer level: Needs assistance Transfers: Sit to/from Stand;Stand Pivot Transfers Sit to Stand: +2 safety/equipment;+2 physical assistance;From elevated surface;Mod assist Stand pivot transfers: Mod assist;+2 physical assistance;+2 safety/equipment General transfer comment: Highly elevated surface. Assist to rise, stabilize, control descent. Used hemi-walker. VCS safety, posture, technique, distance from walker.           End of Session OT - End of Session Activity Tolerance: Patient tolerated treatment well Patient left: in chair;with call bell/phone within reach Nurse Communication: Mobility status;Precautions;Weight bearing status  GO     [name] 02/04/2014, 2:55 PM

## 2014-02-04 NOTE — Anesthesia Preprocedure Evaluation (Addendum)
Anesthesia Evaluation  Patient identified by MRN, date of birth, ID band Patient awake    Reviewed: Allergy & Precautions, H&P , NPO status , Patient's Chart, lab work & pertinent test results  Airway       Dental   Pulmonary pneumonia -, Current Smoker,          Cardiovascular hypertension, Pt. on medications     Neuro/Psych negative neurological ROS  negative psych ROS   GI/Hepatic PUD, (+)     substance abuse  alcohol use, ETOH withdrawal during this hospitalization   Endo/Other  negative endocrine ROSdiabetes, Poorly Controlled, Type 2, Oral Hypoglycemic Agents  Renal/GU negative Renal ROS     Musculoskeletal negative musculoskeletal ROS (+)   Abdominal   Peds negative pediatric ROS (+)  Hematology negative hematology ROS (+) anemia ,   Anesthesia Other Findings   Reproductive/Obstetrics negative OB ROS                          Anesthesia Physical Anesthesia Plan  ASA: III  Anesthesia Plan: General and Regional   Post-op Pain Management:    Induction: Intravenous  Airway Management Planned: Oral ETT  Additional Equipment:   Intra-op Plan:   Post-operative Plan: Extubation in OR  Informed Consent: I have reviewed the patients History and Physical, chart, labs and discussed the procedure including the risks, benefits and alternatives for the proposed anesthesia with the patient or authorized representative who has indicated his/her understanding and acceptance.   Dental advisory given  Plan Discussed with: CRNA  Anesthesia Plan Comments:         Anesthesia Quick Evaluation

## 2014-02-04 NOTE — Progress Notes (Signed)
PROGRESS NOTE  Benjamin [name] PJK:[id number] DOB: [birth year] DOA: 01/28/2014 PCP: [name], MD  Assessment/Plan:  Left proximal humerus fracture - ortho consulted, appreciate input. Patient needing a unit of blood 2/28 - from a medical standpoint he should be OK with surgery early next week, he is a moderate risk, but currently improving. ETOH Abuse - On CIWA, withdrawing resolved. Patient seems determined to quit drinking. Left infrahilar density on CXR - CT without evidence of a mass, ?aspiration pneumonia. CT scan  is more convincing for pneumonia, continue ceftriaxone for 7 day course. Acute encephalopathy - ETOH, elevated ammonia. Improving.  - Continue lactulose, decreasing to once a day his mental status has improved significantly Hypertension - blood pressure trends upwards, started lisinopril 10 mg on 2/28. - Improved this morning DKA - off insulin gtt, continue s.q. Metabolic acidosis - improving UTI - patient febrile 2/24-2/25 night, UA positive. Ceftriaxone. Urine cultures negative DM - continue NPH and SSI PUD - continue PPI Rhabdo - CK improving.  Anemia - no apparent active bleeding, monitor. Hemoglobin slowly trending down to 7.3 on 2/27. Status post one unit transfusion. Hemoglobin improved and has remained stable. Pancreatitis - clinically improving, carb diet  Diet: carb modified Fluids: NS DVT Prophylaxis: SCD  Code Status: Full Family Communication: None this morning Disposition Plan: inpatient  Consultants:  Orthopedic surgery  Procedures:  none   Antibiotics - Ceftriaxone 2/25>>   HPI/Subjective: - Denies any chest pain, breathing difficulties this morning. Left shoulder hurts less   Objective: Filed Vitals:   02/03/14 0516 02/03/14 1400 02/03/14 2125 02/04/14 0551  BP: 148/86 142/78 163/70 144/97  Pulse: 87 94 81 109  Temp: 98.6 F (37 C) 98.1 F (36.7 C) 98.6 F (37 C) 98.7 F (37.1 C)  TempSrc: Oral Oral Oral Oral  Resp: 18 20 20 20     Height:      Weight:      SpO2: 98% 98% 99% 100%    Intake/Output Summary (Last 24 hours) at 02/04/14 0744 Last data filed at 02/04/14 0506  Gross per 24 hour  Intake   3830 ml  Output   1750 ml  Net   2080 ml   Filed Weights   01/29/14 0930  Weight: 67.2 kg (148 lb 2.4 oz)   Exam:  General:  NAD  Cardiovascular: regular rate and rhythm, without MRG  Respiratory: good air movement, clear to auscultation throughout, no wheezing, ronchi or rales  Abdomen: soft, not tender to palpation, positive bowel sounds  MSK: no peripheral edema Neuro: non focal  Data Reviewed: Basic Metabolic Panel:  Recent Labs Lab 01/29/14 0220 01/29/14 0638  01/30/14 1322 01/31/14 0300 02/01/14 0400 02/02/14 0527 02/03/14 0551  NA 141 144  < > 143 144 141 132* 131*  K 4.4 4.1  < > 3.6* 3.1* 4.0 3.2* 3.2*  CL 95* 102  < > 109 109 106 97 95*  CO2 <7* 13*  < > 20 25 20 22 24   GLUCOSE 396* 117*  < > 120* 161* 87 170* 116*  BUN 61* 61*  < > 28* 22 14 8 8   CREATININE 1.51* 1.38*  < > 0.64 0.62 0.55 0.48* 0.49*  CALCIUM 7.7* 8.3*  < > 8.1* 8.1* 7.8* 8.0* 7.7*  MG  --  2.1  --   --  2.0  --   --  1.5  PHOS  --  2.7  --   --  1.3*  --   --  2.1*  < > =  values in this interval not displayed.  Liver Function Tests:  Recent Labs Lab 01/29/14 0638 01/30/14 0450 01/30/14 1322 01/31/14 0300 02/03/14 0551  AST 303* 112* 111* 116* 29  ALT 139* 94* 92* 91* 46  ALKPHOS 362* 366* 372* 361* 258*  BILITOT 0.7 0.7 0.8 0.6 0.4  PROT 5.0* 4.7* 5.1* 5.1* 4.6*  ALBUMIN 2.3* 2.1* 2.2* 2.0* 1.9*    Recent Labs Lab 01/28/14 2200  LIPASE 238*    Recent Labs Lab 01/29/14 0430  AMMONIA 69*   CBC:  Recent Labs Lab 01/30/14 0450 01/31/14 0300 02/01/14 0400 02/02/14 0527 02/03/14 0551  WBC 4.7 4.0 3.2* 2.9* 3.0*  HGB 7.7* 7.5* 7.3* 9.3* 9.5*  HCT 22.9* 22.6* 22.0* 27.5* 26.7*  MCV 97.0 97.8 97.3 93.5 91.4  PLT 134* 133* 152 162 198   Cardiac Enzymes:  Recent Labs Lab  01/28/14 2200 01/29/14 0220 01/29/14 0831 01/29/14 1355 01/29/14 1838 01/30/14 0450  CKTOTAL 481*  --   --   --  620* 481*  TROPONINI  --  <0.30 <0.30 <0.30  --   --    CBG:  Recent Labs Lab 02/02/14 1701 02/02/14 2040 02/03/14 0703 02/03/14 1226 02/03/14 1728  GLUCAP 180* 130* 106* 133* 141*    Recent Results (from the past 240 hour(s))  MRSA PCR SCREENING     Status: None   Collection Time    01/29/14  9:09 AM      Result Value Ref Range Status   MRSA by PCR NEGATIVE  NEGATIVE Final   Comment:            The GeneXpert MRSA Assay (FDA     approved for NASAL specimens     only), is one component of a     comprehensive MRSA colonization     surveillance program. It is not     intended to diagnose MRSA     infection nor to guide or     monitor treatment for     MRSA infections.  URINE CULTURE     Status: None   Collection Time    02/01/14  7:01 AM      Result Value Ref Range Status   Specimen Description URINE, CLEAN CATCH   Final   Special Requests NONE   Final   Culture  Setup Time     Final   Value: 02/01/2014 11:24     Performed at [place]     Final   Value: NO GROWTH     Performed at [facility]   Culture     Final   Value: NO GROWTH     Performed at [facility]   Report Status 02/02/2014 FINAL   Final   Studies: Dg Chest Port 1 View  02/03/2014   CLINICAL DATA:  PICC line placement.  Diabetic.  EXAM: PORTABLE CHEST - 1 VIEW  COMPARISON:  01/31/2014 CT.  01/30/2014 chest x-ray.  FINDINGS: Right PICC line tip distal superior vena cava level. No gross pneumothorax.  Consolidation lung bases consistent with basilar atelectasis/ infiltrate and small pleural effusions.  Heart size top-normal.  Mildly tortuous aorta.  Right humeral neck fracture. Separation of fracture fragments best demonstrated on recent CT with proximal humeral shaft anteriorly located with respect to the humeral head.  IMPRESSION: Right PICC line  tip distal superior vena cava level.  Consolidation lung bases consistent with basilar atelectasis/ infiltrate and small pleural effusions.  Right humeral neck fracture.  Separation of fracture fragments best demonstrated on recent CT with proximal humeral shaft anteriorly located with respect to the humeral head.  This is a call report.   Electronically Signed   By: Chauncey Cruel M.D.   On: 02/03/2014 17:02   Scheduled Meds: . cefTRIAXone (ROCEPHIN)  IV  1 g Intravenous Q24H  . cloNIDine  0.1 mg Oral TID  . docusate sodium  100 mg Oral BID  . feeding supplement (GLUCERNA SHAKE)  237 mL Oral BID BM  . ferrous sulfate  325 mg Oral TID WC  . folic acid  1 mg Oral Daily  . insulin aspart  0-5 Units Subcutaneous QHS  . insulin aspart  0-9 Units Subcutaneous TID WC  . insulin NPH Human  6 Units Subcutaneous BID AC & HS  . lactulose  10 g Oral Daily  . lisinopril  10 mg Oral Daily  . LORazepam  0-4 mg Intravenous Q4H  . pantoprazole  40 mg Oral Daily  . sodium chloride  10-40 mL Intracatheter Q12H  . sodium chloride  3 mL Intravenous Q12H  . sucralfate  1 g Oral TID WC & HS  . thiamine  100 mg Oral Daily   Continuous Infusions: . sodium chloride 1,000 mL (02/03/14 1726)   Active Problems:   ETOH abuse   Diabetes mellitus   Anemia   PUD (peptic ulcer disease)   DKA (diabetic ketoacidoses)   Pancreatitis   Metabolic acidosis   Humerus fracture   Rhabdomyolysis   Pancreatitis, acute   Acute encephalopathy   UTI (urinary tract infection)   Aspiration pneumonia   Essential hypertension, benign  Time spent: 25  This note has been created with Surveyor, quantity. Any transcriptional errors are unintentional.   Marzetta Board, MD [facility] Pager [phone]. If 7 PM - 7 AM, please contact night-coverage at [email or web address], password [facility] 02/04/2014, 7:44 AM  LOS: 7 days

## 2014-02-04 NOTE — Progress Notes (Signed)
Physical Therapy Treatment Patient Details Name: Benjamin [name] MRN: [id number] DOB: [birth year] Today's Date: 02/04/2014 Time: 4196-2229 PT Time Calculation (min): 18 min  PT Assessment / Plan / Recommendation  History of Present Illness 63 yo male admitted with DKA, fall suffering L prox humerus fx. Hx of ETOH abuse, DM, HTN. Pt lives alone per chart.    PT Comments   Pt able to take a few steps with hemiwalker. Pt unsteady and fatigues very easily. Will need continued rehab at [facility]. Pt states he is possibly having surgery on tomorrow.   Follow Up Recommendations  SNF     Does the patient have the potential to tolerate intense rehabilitation     Barriers to Discharge        Equipment Recommendations   (to be determined)    Recommendations for Other Services OT consult  Frequency Min 3X/week   Progress towards PT Goals Progress towards PT goals: Progressing toward goals (very slowly)  Plan Current plan remains appropriate    Precautions / Restrictions Precautions Precautions: Fall Precaution Comments: NO ROM LT UE - pending surg Monday or tuesday for repair Required Braces or Orthoses: Sling Restrictions Weight Bearing Restrictions: Yes LUE Weight Bearing: Non weight bearing Other Position/Activity Restrictions: NO ROM- pt with pending surg    Pertinent Vitals/Pain 8/10 L shoulder     Mobility  Bed Mobility Overal bed mobility: Needs Assistance Bed Mobility: Supine to Sit Supine to sit: HOB elevated;Mod assist General bed mobility comments: cues for sequence and guarding LT UE  Transfers Overall transfer level: Needs assistance Transfers: Sit to/from Stand;Stand Pivot Transfers Sit to Stand: +2 safety/equipment;+2 physical assistance;From elevated surface;Mod assist Stand pivot transfers: Mod assist;+2 physical assistance;+2 safety/equipment General transfer comment: Highly elevated surface. Assist to rise, stabilize, control descent. Used hemi-walker. VCS safety, posture,  technique, distance from walker.  Ambulation/Gait Ambulation/Gait assistance: Mod assist;+2 safety/equipment;+2 physical assistance Ambulation Distance (Feet): 3 Feet Assistive device: Hemi-walker Gait Pattern/deviations: Step-to pattern;Trunk flexed;Shuffle General Gait Details: shuffling steps, flexed posturing. fatigues very easily. very unsteady    Exercises General Exercises - Lower Extremity Long Arc Quad: AROM;Both;10 reps;Seated Hip Flexion/Marching: AAROM;Both;10 reps;Seated   PT Diagnosis:    PT Problem List:   PT Treatment Interventions:     PT Goals (current goals can now be found in the care plan section)    Visit Information  Last PT Received On: 02/04/14 Assistance Needed: +2 History of Present Illness: 64 yo male admitted with DKA, fall suffering L prox humerus fx. Hx of ETOH abuse, DM, HTN. Pt lives alone per chart.     Subjective Data      Cognition  Cognition Arousal/Alertness: Awake/alert Behavior During Therapy: WFL for tasks assessed/performed Overall Cognitive Status: Within Functional Limits for tasks assessed    Balance     End of Session PT - End of Session Equipment Utilized During Treatment: Gait belt Activity Tolerance: Patient limited by fatigue;Patient limited by pain Patient left: in chair;with call bell/phone within reach Nurse Communication: Mobility status;Precautions   GP     Weston Anna, MPT Pager: [phone]

## 2014-02-05 ENCOUNTER — Encounter (HOSPITAL_COMMUNITY): Payer: Self-pay | Admitting: Certified Registered Nurse Anesthetist

## 2014-02-05 ENCOUNTER — Inpatient Hospital Stay (HOSPITAL_COMMUNITY): Payer: BC Managed Care – PPO | Admitting: Anesthesiology

## 2014-02-05 ENCOUNTER — Encounter (HOSPITAL_COMMUNITY)
Admission: EM | Disposition: A | Discharge status: Skilled Nursing Facility | Payer: Self-pay | Source: Home / Self Care | Attending: Internal Medicine

## 2014-02-05 ENCOUNTER — Inpatient Hospital Stay (HOSPITAL_COMMUNITY): Payer: BC Managed Care – PPO

## 2014-02-05 ENCOUNTER — Encounter (HOSPITAL_COMMUNITY): Payer: BC Managed Care – PPO | Admitting: Anesthesiology

## 2014-02-05 HISTORY — PX: ORIF HUMERUS FRACTURE: SHX2126

## 2014-02-05 LAB — CBC
HCT: 28.5 % — ABNORMAL LOW (ref 39.0–52.0)
Hemoglobin: 9.8 g/dL — ABNORMAL LOW (ref 13.0–17.0)
MCH: 31.8 pg (ref 26.0–34.0)
MCHC: 34.4 g/dL (ref 30.0–36.0)
MCV: 92.5 fL (ref 78.0–100.0)
Platelets: 329 10*3/uL (ref 150–400)
RBC: 3.08 MIL/uL — ABNORMAL LOW (ref 4.22–5.81)
RDW: 15.8 % — AB (ref 11.5–15.5)
WBC: 4.4 10*3/uL (ref 4.0–10.5)

## 2014-02-05 LAB — BASIC METABOLIC PANEL
BUN: 8 mg/dL (ref 6–23)
CALCIUM: 8 mg/dL — AB (ref 8.4–10.5)
CO2: 24 mEq/L (ref 19–32)
CREATININE: 0.56 mg/dL (ref 0.50–1.35)
Chloride: 98 mEq/L (ref 96–112)
Glucose, Bld: 98 mg/dL (ref 70–99)
Potassium: 3.8 mEq/L (ref 3.7–5.3)
Sodium: 130 mEq/L — ABNORMAL LOW (ref 137–147)

## 2014-02-05 LAB — SURGICAL PCR SCREEN
MRSA, PCR: NEGATIVE
Staphylococcus aureus: NEGATIVE

## 2014-02-05 LAB — GLUCOSE, CAPILLARY: GLUCOSE-CAPILLARY: 84 mg/dL (ref 70–99)

## 2014-02-05 SURGERY — OPEN REDUCTION INTERNAL FIXATION (ORIF) PROXIMAL HUMERUS FRACTURE
Anesthesia: Regional | Site: Arm Upper | Laterality: Left

## 2014-02-05 MED ORDER — METOCLOPRAMIDE HCL 10 MG PO TABS: 5.0000 mg | ORAL_TABLET | Freq: Three times a day (TID) | ORAL | Status: DC | PRN

## 2014-02-05 MED ORDER — CISATRACURIUM BESYLATE (PF) 10 MG/5ML IV SOLN
INTRAVENOUS | Status: DC | PRN
  Administered 2014-02-05: 6 mg via INTRAVENOUS
  Administered 2014-02-05 (×2): 2 mg via INTRAVENOUS

## 2014-02-05 MED ORDER — PROPOFOL 10 MG/ML IV BOLUS
INTRAVENOUS | Status: AC
  Filled 2014-02-05: qty 20

## 2014-02-05 MED ORDER — CEFAZOLIN SODIUM 1-5 GM-% IV SOLN
1.0000 g | Freq: Four times a day (QID) | INTRAVENOUS | Status: AC
  Administered 2014-02-05 – 2014-02-06 (×3): 1 g via INTRAVENOUS
  Filled 2014-02-05 (×4): qty 50

## 2014-02-05 MED ORDER — ROPIVACAINE HCL 5 MG/ML IJ SOLN
INTRAMUSCULAR | Status: DC | PRN
Start: 2014-02-05 — End: 2014-02-05
  Administered 2014-02-05: 30 mL via EPIDURAL

## 2014-02-05 MED ORDER — CEFAZOLIN SODIUM-DEXTROSE 2-3 GM-% IV SOLR
INTRAVENOUS | Status: DC | PRN
  Administered 2014-02-05: 2 g via INTRAVENOUS

## 2014-02-05 MED ORDER — NEOSTIGMINE METHYLSULFATE 1 MG/ML IJ SOLN
INTRAMUSCULAR | Status: AC
  Filled 2014-02-05: qty 10

## 2014-02-05 MED ORDER — LIDOCAINE HCL (CARDIAC) 20 MG/ML IV SOLN
INTRAVENOUS | Status: AC
  Filled 2014-02-05: qty 5

## 2014-02-05 MED ORDER — ONDANSETRON HCL 4 MG/2ML IJ SOLN
INTRAMUSCULAR | Status: AC
  Filled 2014-02-05: qty 2

## 2014-02-05 MED ORDER — CEFAZOLIN SODIUM-DEXTROSE 2-3 GM-% IV SOLR
INTRAVENOUS | Status: AC
  Filled 2014-02-05: qty 50

## 2014-02-05 MED ORDER — PHENYLEPHRINE HCL 10 MG/ML IJ SOLN
INTRAMUSCULAR | Status: DC | PRN
  Administered 2014-02-05 (×3): 40 ug via INTRAVENOUS
  Administered 2014-02-05 (×2): 20 ug via INTRAVENOUS
  Administered 2014-02-05: 30 ug via INTRAVENOUS
  Administered 2014-02-05: 40 ug via INTRAVENOUS

## 2014-02-05 MED ORDER — 0.9 % SODIUM CHLORIDE (POUR BTL) OPTIME
TOPICAL | Status: DC | PRN
  Administered 2014-02-05: 1000 mL

## 2014-02-05 MED ORDER — LIDOCAINE HCL (CARDIAC) 20 MG/ML IV SOLN
INTRAVENOUS | Status: DC | PRN
  Administered 2014-02-05: 75 mg via INTRAVENOUS

## 2014-02-05 MED ORDER — FENTANYL CITRATE 0.05 MG/ML IJ SOLN
INTRAMUSCULAR | Status: DC | PRN
Start: 2014-02-05 — End: 2014-02-05
  Administered 2014-02-05: 100 ug via INTRAVENOUS
  Administered 2014-02-05 (×3): 50 ug via INTRAVENOUS

## 2014-02-05 MED ORDER — GLYCOPYRROLATE 0.2 MG/ML IJ SOLN
INTRAMUSCULAR | Status: DC | PRN
  Administered 2014-02-05: .3 mg via INTRAVENOUS

## 2014-02-05 MED ORDER — DIPHENHYDRAMINE HCL 12.5 MG/5ML PO ELIX: 12.5000 mg | ORAL_SOLUTION | ORAL | Status: DC | PRN

## 2014-02-05 MED ORDER — LACTATED RINGERS IV SOLN
INTRAVENOUS | Status: DC | PRN
  Administered 2014-02-05 (×2): via INTRAVENOUS

## 2014-02-05 MED ORDER — SUCCINYLCHOLINE CHLORIDE 20 MG/ML IJ SOLN
INTRAMUSCULAR | Status: DC | PRN
  Administered 2014-02-05: 100 mg via INTRAVENOUS

## 2014-02-05 MED ORDER — MIDAZOLAM HCL 5 MG/5ML IJ SOLN
INTRAMUSCULAR | Status: DC | PRN
  Administered 2014-02-05 (×2): 1 mg via INTRAVENOUS

## 2014-02-05 MED ORDER — GLYCOPYRROLATE 0.2 MG/ML IJ SOLN
INTRAMUSCULAR | Status: AC
  Filled 2014-02-05: qty 3

## 2014-02-05 MED ORDER — POLYETHYLENE GLYCOL 3350 17 G PO PACK
17.0000 g | PACK | Freq: Every day | ORAL | Status: DC | PRN
  Filled 2014-02-05: qty 1

## 2014-02-05 MED ORDER — FENTANYL CITRATE 0.05 MG/ML IJ SOLN
INTRAMUSCULAR | Status: AC
  Filled 2014-02-05: qty 5

## 2014-02-05 MED ORDER — BUPIVACAINE-EPINEPHRINE PF 0.5-1:200000 % IJ SOLN
INTRAMUSCULAR | Status: AC
  Filled 2014-02-05: qty 30

## 2014-02-05 MED ORDER — HYDROMORPHONE HCL PF 1 MG/ML IJ SOLN: 0.2500 mg | INTRAMUSCULAR | Status: DC | PRN

## 2014-02-05 MED ORDER — METOCLOPRAMIDE HCL 5 MG/ML IJ SOLN: 5.0000 mg | Freq: Three times a day (TID) | INTRAMUSCULAR | Status: DC | PRN

## 2014-02-05 MED ORDER — BACITRACIN ZINC 500 UNIT/GM EX OINT
TOPICAL_OINTMENT | CUTANEOUS | Status: AC
  Filled 2014-02-05: qty 28.35

## 2014-02-05 MED ORDER — OXYCODONE HCL 5 MG/5ML PO SOLN
5.0000 mg | Freq: Once | ORAL | Status: DC | PRN
  Filled 2014-02-05: qty 5

## 2014-02-05 MED ORDER — LABETALOL HCL 5 MG/ML IV SOLN
INTRAVENOUS | Status: AC
  Filled 2014-02-05: qty 4

## 2014-02-05 MED ORDER — CISATRACURIUM BESYLATE 20 MG/10ML IV SOLN
INTRAVENOUS | Status: AC
  Filled 2014-02-05: qty 10

## 2014-02-05 MED ORDER — LABETALOL HCL 5 MG/ML IV SOLN: 5.0000 mg | INTRAVENOUS | Status: DC | PRN

## 2014-02-05 MED ORDER — MIDAZOLAM HCL 2 MG/2ML IJ SOLN
INTRAMUSCULAR | Status: AC
  Filled 2014-02-05: qty 2

## 2014-02-05 MED ORDER — CEFAZOLIN SODIUM-DEXTROSE 2-3 GM-% IV SOLR: 2.0000 g | INTRAVENOUS | Status: DC

## 2014-02-05 MED ORDER — PROPOFOL 10 MG/ML IV BOLUS
INTRAVENOUS | Status: DC | PRN
  Administered 2014-02-05: 175 mg via INTRAVENOUS

## 2014-02-05 MED ORDER — LACTATED RINGERS IV SOLN: INTRAVENOUS | Status: DC

## 2014-02-05 MED ORDER — BISACODYL 5 MG PO TBEC: 5.0000 mg | DELAYED_RELEASE_TABLET | Freq: Every day | ORAL | Status: DC | PRN

## 2014-02-05 MED ORDER — LABETALOL HCL 5 MG/ML IV SOLN
5.0000 mg | Freq: Once | INTRAVENOUS | Status: AC
  Administered 2014-02-05: 5 mg via INTRAVENOUS

## 2014-02-05 MED ORDER — LISINOPRIL 20 MG PO TABS
20.0000 mg | ORAL_TABLET | Freq: Every day | ORAL | Status: DC
  Administered 2014-02-06 – 2014-02-11 (×6): 20 mg via ORAL
  Filled 2014-02-05 (×6): qty 1

## 2014-02-05 MED ORDER — MENTHOL 3 MG MT LOZG
1.0000 | LOZENGE | OROMUCOSAL | Status: DC | PRN
  Filled 2014-02-05: qty 9

## 2014-02-05 MED ORDER — PHENOL 1.4 % MT LIQD
1.0000 | OROMUCOSAL | Status: DC | PRN
  Filled 2014-02-05: qty 177

## 2014-02-05 MED ORDER — OXYCODONE HCL 5 MG PO TABS: 5.0000 mg | ORAL_TABLET | Freq: Once | ORAL | Status: DC | PRN

## 2014-02-05 MED ORDER — LABETALOL HCL 5 MG/ML IV SOLN
5.0000 mg | INTRAVENOUS | Status: AC
  Administered 2014-02-05 (×2): 5 mg via INTRAVENOUS

## 2014-02-05 MED ORDER — DEXAMETHASONE SODIUM PHOSPHATE 10 MG/ML IJ SOLN
INTRAMUSCULAR | Status: AC
  Filled 2014-02-05: qty 1

## 2014-02-05 MED ORDER — NEOSTIGMINE METHYLSULFATE 1 MG/ML IJ SOLN
INTRAMUSCULAR | Status: DC | PRN
  Administered 2014-02-05: 2 mg via INTRAVENOUS

## 2014-02-05 MED ORDER — ROPIVACAINE HCL 5 MG/ML IJ SOLN
INTRAMUSCULAR | Status: AC
  Filled 2014-02-05: qty 30

## 2014-02-05 MED ORDER — PROMETHAZINE HCL 25 MG/ML IJ SOLN: 6.2500 mg | INTRAMUSCULAR | Status: DC | PRN

## 2014-02-05 MED ORDER — ONDANSETRON HCL 4 MG/2ML IJ SOLN
INTRAMUSCULAR | Status: DC | PRN
  Administered 2014-02-05 (×2): 2 mg via INTRAVENOUS

## 2014-02-05 MED ORDER — MEPERIDINE HCL 50 MG/ML IJ SOLN: 6.2500 mg | INTRAMUSCULAR | Status: DC | PRN

## 2014-02-05 SURGICAL SUPPLY — 61 items
BAG ZIPLOCK 12X15 (MISCELLANEOUS) ×3 IMPLANT
BIT DRILL 2.5 SHOU (BIT) ×2 IMPLANT
BIT DRILL 2.5MM SHOU (BIT) ×1
BIT DRILL 2.7 SHOU (BIT) ×2 IMPLANT
BIT DRILL 2.7MM SHOU (BIT) ×1
BOOTIES KNEE HIGH SLOAN (MISCELLANEOUS) ×6 IMPLANT
CLOSURE WOUND 1/2 X4 (GAUZE/BANDAGES/DRESSINGS) ×1
DECANTER SPIKE VIAL GLASS SM (MISCELLANEOUS) IMPLANT
DRAPE ORTHO SPLIT 77X108 STRL (DRAPES) ×4
DRAPE POUCH INSTRU U-SHP 10X18 (DRAPES) ×3 IMPLANT
DRAPE SURG 17X11 SM STRL (DRAPES) ×6 IMPLANT
DRAPE SURG ORHT 6 SPLT 77X108 (DRAPES) ×2 IMPLANT
DRAPE U-SHAPE 47X51 STRL (DRAPES) ×3 IMPLANT
DRSG AQUACEL AG ADV 3.5X10 (GAUZE/BANDAGES/DRESSINGS) ×3 IMPLANT
DRSG EMULSION OIL 3X3 NADH (GAUZE/BANDAGES/DRESSINGS) IMPLANT
DURAPREP 26ML APPLICATOR (WOUND CARE) ×3 IMPLANT
ELECT REM PT RETURN 9FT ADLT (ELECTROSURGICAL) ×3
ELECTRODE REM PT RTRN 9FT ADLT (ELECTROSURGICAL) ×1 IMPLANT
GLOVE BIO SURGEON STRL SZ7.5 (GLOVE) ×3 IMPLANT
GLOVE BIO SURGEON STRL SZ8 (GLOVE) ×3 IMPLANT
GLOVE ECLIPSE 7.5 STRL STRAW (GLOVE) ×3 IMPLANT
GLOVE EUDERMIC 7 POWDERFREE (GLOVE) ×3 IMPLANT
IMMOBILIZER SHOULDER SM CANVAS (SOFTGOODS) ×3 IMPLANT
K-WIRE 1.6 SHOU (Wire) ×9 IMPLANT
KIT BASIN OR (CUSTOM PROCEDURE TRAY) ×3 IMPLANT
MANIFOLD NEPTUNE II (INSTRUMENTS) ×3 IMPLANT
NDL SAFETY ECLIPSE 18X1.5 (NEEDLE) ×1 IMPLANT
NEEDLE HYPO 18GX1.5 SHARP (NEEDLE) ×2
NEEDLE MA TROC 1/2 CIR (NEEDLE) ×3 IMPLANT
NEEDLE MAYO .5 CIRCLE (NEEDLE) IMPLANT
NS IRRIG 1000ML POUR BTL (IV SOLUTION) ×3 IMPLANT
PACK SHOULDER CUSTOM OPM052 (CUSTOM PROCEDURE TRAY) ×3 IMPLANT
PAD ABD 8X10 STRL (GAUZE/BANDAGES/DRESSINGS) IMPLANT
PASSER SUT SWANSON 36MM LOOP (INSTRUMENTS) IMPLANT
PLATE HUMERUS OPTI PROX 2H SHO (Plate) ×3 IMPLANT
POSITIONER SURGICAL ARM (MISCELLANEOUS) ×3 IMPLANT
SCREW LOCKING 3.5X24 SHOU (Screw) ×6 IMPLANT
SCREW LOCKING 3.5X26 SHOU (Screw) ×3 IMPLANT
SCREW LOCKING 3.5X34 SHOU (Screw) ×3 IMPLANT
SCREW LOCKING 3.5X36 SHOU (Screw) ×3 IMPLANT
SCREW LOCKING 3.5X40 SHOU (Screw) ×6 IMPLANT
SCREW LOCKING 3.5X44 SHOU (Screw) ×3 IMPLANT
SCREW LOCKING 3.5X52 SHOU (Screw) ×3 IMPLANT
SCREW NONLOCK 3.5X24 SHOU (Screw) ×3 IMPLANT
SLING ARM IMMOBILIZER LRG (SOFTGOODS) ×3 IMPLANT
SPONGE GAUZE 4X4 12PLY (GAUZE/BANDAGES/DRESSINGS) IMPLANT
SPONGE LAP 18X18 X RAY DECT (DISPOSABLE) ×3 IMPLANT
SPONGE LAP 4X18 X RAY DECT (DISPOSABLE) IMPLANT
SPONGE SURGIFOAM ABS GEL 100 (HEMOSTASIS) ×3 IMPLANT
STAPLER VISISTAT 35W (STAPLE) ×3 IMPLANT
STRIP CLOSURE SKIN 1/2X4 (GAUZE/BANDAGES/DRESSINGS) ×2 IMPLANT
SUT BONE WAX W31G (SUTURE) IMPLANT
SUT MNCRL AB 3-0 PS2 18 (SUTURE) ×3 IMPLANT
SUT VIC AB 0 CT1 27 (SUTURE)
SUT VIC AB 0 CT1 27XBRD ANTBC (SUTURE) IMPLANT
SUT VIC AB 1 CT1 36 (SUTURE) ×3 IMPLANT
SUT VIC AB 2-0 CT1 27 (SUTURE) ×2
SUT VIC AB 2-0 CT1 27XBRD (SUTURE) IMPLANT
SUT VIC AB 2-0 CT1 TAPERPNT 27 (SUTURE) ×1 IMPLANT
SYR 50ML LL SCALE MARK (SYRINGE) ×3 IMPLANT
WATER STERILE IRR 1500ML POUR (IV SOLUTION) ×3 IMPLANT

## 2014-02-05 NOTE — Preoperative (Signed)
Beta Blockers   Reason not to administer Beta Blockers:Not Applicable 

## 2014-02-05 NOTE — Anesthesia Procedure Notes (Addendum)
Procedure Name: Intubation Date/Time: 02/05/2014 7:53 AM Performed by: Ofilia Neas Pre-anesthesia Checklist: Patient identified, Patient being monitored, Timeout performed, Emergency Drugs available and Suction available Patient Re-evaluated:Patient Re-evaluated prior to inductionOxygen Delivery Method: Circle system utilized Preoxygenation: Pre-oxygenation with 100% oxygen Intubation Type: IV induction and Cricoid Pressure applied Ventilation: Mask ventilation without difficulty Laryngoscope Size: Miller and 3 Grade View: Grade III Tube type: Oral Tube size: 7.5 mm Number of attempts: 3 Airway Equipment and Method: Stylet Placement Confirmation: ETT inserted through vocal cords under direct vision,  positive ETCO2 and breath sounds checked- equal and bilateral Secured at: 21 cm Tube secured with: Tape Dental Injury: Teeth and Oropharynx as per pre-operative assessment  Difficulty Due To: Difficulty was anticipated, Difficult Airway- due to reduced neck mobility, Difficult Airway- due to large tongue, Difficult Airway- due to limited oral opening, Difficult Airway- due to dentition and Difficult Airway- due to anterior larynx Future Recommendations: Recommend- induction with short-acting agent, and alternative techniques readily available   Anesthesia Regional Block:  Supraclavicular block  Pre-Anesthetic Checklist: ,, timeout performed, Correct Patient, Correct Site, Correct Laterality, Correct Procedure, Correct Position, site marked, Risks and benefits discussed,  Surgical consent,  Pre-op evaluation,  At surgeon's request and post-op pain management  Laterality: Left  Prep: chloraprep       Needles:  Injection technique: Single-shot  Needle Type: Stimiplex     Needle Length: 9cm 9 cm Needle Gauge: 21 and 21 G    Additional Needles:  Procedures: ultrasound guided (picture in chart) and nerve stimulator Supraclavicular block  Nerve Stimulator or Paresthesia:   Response: bicep, 0.5 mA,   Additional Responses:   Narrative:  Start time: 02/05/2014 7:20 AM End time: 02/05/2014 7:25 AM Injection made incrementally with aspirations every 5 mL.  Performed by: Personally  Anesthesiologist: Amirrah Quigley  Additional Notes: Risks, benefits and alternative to block explained extensively.  Patient tolerated procedure well, without complications.

## 2014-02-05 NOTE — Op Note (Signed)
NAMEDYLAND, PANUCO NO.:  [id number]  MEDICAL RECORD NO.:  [id number]  LOCATION:  22                         FACILITY:  [facility]  PHYSICIAN:  [name]. Tiphany Fayson, M.D.  DATE OF BIRTH:  08-10-1951  DATE OF PROCEDURE:  02/05/2014 DATE OF DISCHARGE:                              OPERATIVE REPORT   ADDENDUM:  Jenetta Loges, PA-C was used as an Environmental consultant throughout this case essential for help with positioning of the patient, positioning of the extremity, retraction of soft tissues, fracture reduction, wound closure, and intraoperative decision making.     [name]. Sanjuanita Condrey, M.D.     KMS/MEDQ  D:  02/05/2014  T:  02/05/2014  Job:  [id number]

## 2014-02-05 NOTE — Op Note (Signed)
01/28/2014 - 02/05/2014  9:20 AM  PATIENT:   Benjamin [name]  63 y.o. male  PRE-OPERATIVE DIAGNOSIS: SEVERELY DISPLACED, 2 PART,  LEFT PROXIMAL HUMERUS FRACTURE   POST-OPERATIVE DIAGNOSIS:  same  PROCEDURE:  ORIF  SURGEON:  Dmauri Rosenow, [name]. M.D.  ASSISTANTS: Shuford pac   ANESTHESIA:   GET + ISB  EBL: 100cc  SPECIMEN:  none  Drains: none   PATIENT DISPOSITION:  PACU - hemodynamically stable.    PLAN OF CARE: Admit to inpatient   Dictation# [id number], [id number]

## 2014-02-05 NOTE — Anesthesia Postprocedure Evaluation (Signed)
Anesthesia Post Note  Patient: Benjamin [name]  Procedure(s) Performed: Procedure(s) (LRB): OPEN REDUCTION INTERNAL FIXATION (ORIF) LEFT  PROXIMAL HUMERUS FRACTURE (Left)  Anesthesia type: General with supraclavicular block  Patient location: PACU  Post pain: Pain level controlled  Post assessment: Post-op Vital signs reviewed  Last Vitals: BP 187/88  Pulse 81  Temp(Src) 36.7 C (Oral)  Resp 20  Ht 5\' 9"  (1.753 m)  Wt 148 lb 2.4 oz (67.2 kg)  BMI 21.87 kg/m2  SpO2 96%  Post vital signs: Reviewed  Level of consciousness: sedated  Complications: No apparent anesthesia complications

## 2014-02-05 NOTE — Progress Notes (Signed)
PROGRESS NOTE  Benjamin [name] BPZ:[id number] DOB: [birth year] DOA: 01/28/2014 PCP: [name], MD  HPI / Interval  history of alcohol abuse and peptic ulcer disease. Was last admitted to July with her upper GI bleed. Patient resides at home by himself. He is unable to provide his own history. Per ER notes and EMS patient was found down on the floor covered in his own feces. Patient was noted to have large amount of bruising to his left shoulder. He was brought then to emergency department where he was found to have surgical neck fracture left proximal humerus. Patient was noted to be hypothermic requiring. After receiving IV fluids is dramatically proved. Initially he was found to be hyperglycemic and was started on glucose stabilizer with good results. He is blood work was significant for metabolic acidosis and increased lactic acid. Patient is currently improving. Lipase initially was noted to be elevated to 200 range. Patient states that he drinks 4-5 glasses of wine a day was drink was on Friday which is 4 days ago patient history somewhat unreliable. Initially CCM was called to admission but defer to hospitalist.  Patient initially admitted to SDU with acute encephalopathy, rhabdomyolysis, lactic acidosis, DKA. Initially febrile due to likely lung source, s/p a 7 day course with Ceftriaxone finished 02/05/14. On admission he was found to have a displaced left proximal humerus fracture, which, after medical stabilization has been surgically addressed per orthopedic surgery.   Assessment/Plan:  Left proximal humerus fracture - ortho consulted, appreciate input.  - s/p OR 02/05/2014 ETOH Abuse - On CIWA, withdrawing resolved. Patient seems determined to quit drinking. Left infrahilar density on CXR - CT without evidence of a mass, ?aspiration pneumonia. CT scan  is more convincing for pneumonia, continue ceftriaxone for 7 day course, today is the last day.  Acute encephalopathy - ETOH, elevated ammonia.  Improving.  - Continue lactulose, decreasing to once a day his mental status has improved significantly Hypertension - blood pressure trends upwards, started lisinopril 10 mg on 2/28, increased to 20 mg 3/3 DKA - off insulin gtt, continue s.q. Metabolic acidosis - improving UTI - patient febrile 2/24-2/25 night, UA positive. Ceftriaxone. Urine cultures negative DM - continue NPH and SSI PUD - continue PPI Rhabdo - CK improving.  Anemia - no apparent active bleeding, monitor. Hemoglobin slowly trending down to 7.3 on 2/27. Status post one unit transfusion. Hemoglobin improved and has remained stable. Pancreatitis - clinically improving, carb diet  Diet: carb modified Fluids: NS DVT Prophylaxis: SCD  Code Status: Full Family Communication: None this morning Disposition Plan: inpatient, SNF when medically ready,  Consultants:  Orthopedic surgery  Procedures:  ORIF left proximal humerus fracture, Dr. Onnie Graham 02/05/14.   Antibiotics - Ceftriaxone 2/25>>3/3  HPI/Subjective: - Denies any chest pain, breathing difficulties this morning. Upbeat after surgery, denies any pain  Objective: Filed Vitals:   02/05/14 1110 02/05/14 1120 02/05/14 1348 02/05/14 1419  BP: 194/87 187/88 166/87   Pulse: 79 81  96  Temp:  98 F (36.7 C)  98 F (36.7 C)  TempSrc:    Oral  Resp: 13 20  16   Height:      Weight:      SpO2: 100% 96%  100%    Intake/Output Summary (Last 24 hours) at 02/05/14 1421 Last data filed at 02/05/14 1419  Gross per 24 hour  Intake   4820 ml  Output   2615 ml  Net   2205 ml   Filed Weights   01/29/14 0930  Weight: 67.2 kg (148 lb 2.4 oz)   Exam:  General:  NAD  Cardiovascular: regular rate and rhythm, without MRG  Respiratory: good air movement, clear to auscultation throughout, no wheezing, ronchi or rales  Abdomen: soft, not tender to palpation, positive bowel sounds  MSK: no peripheral edema Neuro: non focal  Data Reviewed: Basic Metabolic  Panel:  Recent Labs Lab 01/31/14 0300 02/01/14 0400 02/02/14 0527 02/03/14 0551 02/04/14 1425 02/05/14 0450  NA 144 141 132* 131* 132* 130*  K 3.1* 4.0 3.2* 3.2* 3.8 3.8  CL 109 106 97 95* 99 98  CO2 25 20 22 24 23 24   GLUCOSE 161* 87 170* 116* 202* 98  BUN 22 14 8 8 9 8   CREATININE 0.62 0.55 0.48* 0.49* 0.47* 0.56  CALCIUM 8.1* 7.8* 8.0* 7.7* 7.8* 8.0*  MG 2.0  --   --  1.5  --   --   PHOS 1.3*  --   --  2.1*  --   --     Liver Function Tests:  Recent Labs Lab 01/30/14 0450 01/30/14 1322 01/31/14 0300 02/03/14 0551  AST 112* 111* 116* 29  ALT 94* 92* 91* 46  ALKPHOS 366* 372* 361* 258*  BILITOT 0.7 0.8 0.6 0.4  PROT 4.7* 5.1* 5.1* 4.6*  ALBUMIN 2.1* 2.2* 2.0* 1.9*   No results found for this basename: LIPASE, AMYLASE,  in the last 168 hours No results found for this basename: AMMONIA,  in the last 168 hours CBC:  Recent Labs Lab 02/01/14 0400 02/02/14 0527 02/03/14 0551 02/04/14 1425 02/05/14 0450  WBC 3.2* 2.9* 3.0* 4.2 4.4  HGB 7.3* 9.3* 9.5* 9.5* 9.8*  HCT 22.0* 27.5* 26.7* 27.3* 28.5*  MCV 97.3 93.5 91.4 92.5 92.5  PLT 152 162 198 302 329   Cardiac Enzymes:  Recent Labs Lab 01/29/14 1838 01/30/14 0450  CKTOTAL 620* 481*   CBG:  Recent Labs Lab 02/04/14 0744 02/04/14 1154 02/04/14 1659 02/04/14 2215 02/05/14 1033  GLUCAP 142* 228* 152* 152* 84    Recent Results (from the past 240 hour(s))  MRSA PCR SCREENING     Status: None   Collection Time    01/29/14  9:09 AM      Result Value Ref Range Status   MRSA by PCR NEGATIVE  NEGATIVE Final   Comment:            The GeneXpert MRSA Assay (FDA     approved for NASAL specimens     only), is one component of a     comprehensive MRSA colonization     surveillance program. It is not     intended to diagnose MRSA     infection nor to guide or     monitor treatment for     MRSA infections.  URINE CULTURE     Status: None   Collection Time    02/01/14  7:01 AM      Result Value Ref  Range Status   Specimen Description URINE, CLEAN CATCH   Final   Special Requests NONE   Final   Culture  Setup Time     Final   Value: 02/01/2014 11:24     Performed at [facility]     Final   Value: NO GROWTH     Performed at [facility]   Culture     Final   Value: NO GROWTH     Performed at [facility]  Report Status 02/02/2014 FINAL   Final  SURGICAL PCR SCREEN     Status: None   Collection Time    02/05/14  1:21 AM      Result Value Ref Range Status   MRSA, PCR NEGATIVE  NEGATIVE Final   Staphylococcus aureus NEGATIVE  NEGATIVE Final   Comment:            The Xpert SA Assay (FDA     approved for NASAL specimens     in patients over 23 years of age),     is one component of     a comprehensive surveillance     program.  Test performance has     been validated by [employer] for patients greater     than or equal to 63 year old.     It is not intended     to diagnose infection nor to     guide or monitor treatment.   Studies: Dg Chest Port 1 View  02/03/2014   CLINICAL DATA:  PICC line placement.  Diabetic.  EXAM: PORTABLE CHEST - 1 VIEW  COMPARISON:  01/31/2014 CT.  01/30/2014 chest x-ray.  FINDINGS: Right PICC line tip distal superior vena cava level. No gross pneumothorax.  Consolidation lung bases consistent with basilar atelectasis/ infiltrate and small pleural effusions.  Heart size top-normal.  Mildly tortuous aorta.  Right humeral neck fracture. Separation of fracture fragments best demonstrated on recent CT with proximal humeral shaft anteriorly located with respect to the humeral head.  IMPRESSION: Right PICC line tip distal superior vena cava level.  Consolidation lung bases consistent with basilar atelectasis/ infiltrate and small pleural effusions.  Right humeral neck fracture. Separation of fracture fragments best demonstrated on recent CT with proximal humeral shaft anteriorly located with respect to the humeral head.   This is a call report.   Electronically Signed   By: Chauncey Cruel M.D.   On: 02/03/2014 17:02   Scheduled Meds: .  ceFAZolin (ANCEF) IV  1 g Intravenous Q6H  . cefTRIAXone (ROCEPHIN)  IV  1 g Intravenous Q24H  . cloNIDine  0.1 mg Oral TID  . docusate sodium  100 mg Oral BID  . feeding supplement (GLUCERNA SHAKE)  237 mL Oral BID BM  . ferrous sulfate  325 mg Oral TID WC  . folic acid  1 mg Oral Daily  . insulin aspart  0-5 Units Subcutaneous QHS  . insulin aspart  0-9 Units Subcutaneous TID WC  . insulin NPH Human  6 Units Subcutaneous BID AC & HS  . labetalol      . lactulose  10 g Oral Daily  . lisinopril  10 mg Oral Daily  . LORazepam  0-4 mg Intravenous Q4H  . pantoprazole  40 mg Oral Daily  . sodium chloride  10-40 mL Intracatheter Q12H  . sodium chloride  3 mL Intravenous Q12H  . sucralfate  1 g Oral TID WC & HS  . thiamine  100 mg Oral Daily   Continuous Infusions: . sodium chloride 1,000 mL (02/05/14 1130)  . lactated ringers     Active Problems:   ETOH abuse   Diabetes mellitus   Anemia   PUD (peptic ulcer disease)   DKA (diabetic ketoacidoses)   Pancreatitis   Metabolic acidosis   Humerus fracture   Rhabdomyolysis   Pancreatitis, acute   Acute encephalopathy   UTI (urinary tract infection)   Aspiration pneumonia   Essential hypertension, benign  Time spent: 25  This note has been created with Surveyor, quantity. Any transcriptional errors are unintentional.   Marzetta Board, MD [facility] Pager [phone]. If 7 PM - 7 AM, please contact night-coverage at [email or web address], password [facility] 02/05/2014, 2:21 PM  LOS: 8 days

## 2014-02-05 NOTE — Transfer of Care (Signed)
Immediate Anesthesia Transfer of Care Note  Patient: Benjamin [name]  Procedure(s) Performed: Procedure(s): OPEN REDUCTION INTERNAL FIXATION (ORIF) LEFT  PROXIMAL HUMERUS FRACTURE (Left)  Patient Location: PACU  Anesthesia Type:General  Level of Consciousness: awake, alert , oriented and patient cooperative  Airway & Oxygen Therapy: Patient Spontanous Breathing and Patient connected to face mask oxygen  Post-op Assessment: Report given to PACU RN, Post -op Vital signs reviewed and stable and Patient moving all extremities  Post vital signs: Reviewed and stable  Complications: No apparent anesthesia complications

## 2014-02-06 DIAGNOSIS — E119 Type 2 diabetes mellitus without complications: Secondary | ICD-10-CM

## 2014-02-06 DIAGNOSIS — I10 Essential (primary) hypertension: Secondary | ICD-10-CM

## 2014-02-06 DIAGNOSIS — S42309A Unspecified fracture of shaft of humerus, unspecified arm, initial encounter for closed fracture: Secondary | ICD-10-CM

## 2014-02-06 LAB — COMPREHENSIVE METABOLIC PANEL
ALT: 30 U/L (ref 0–53)
AST: 22 U/L (ref 0–37)
Albumin: 1.9 g/dL — ABNORMAL LOW (ref 3.5–5.2)
Alkaline Phosphatase: 235 U/L — ABNORMAL HIGH (ref 39–117)
BUN: 7 mg/dL (ref 6–23)
CALCIUM: 7.9 mg/dL — AB (ref 8.4–10.5)
CO2: 23 mEq/L (ref 19–32)
Chloride: 96 mEq/L (ref 96–112)
Creatinine, Ser: 0.5 mg/dL (ref 0.50–1.35)
GFR calc Af Amer: >90 mL/min (ref >90)
GFR calc non Af Amer: >90 mL/min (ref >90)
Glucose, Bld: 171 mg/dL — ABNORMAL HIGH (ref 70–99)
Potassium: 3.5 mEq/L — ABNORMAL LOW (ref 3.7–5.3)
Sodium: 130 mEq/L — ABNORMAL LOW (ref 137–147)
TOTAL PROTEIN: 4.7 g/dL — AB (ref 6.0–8.3)
Total Bilirubin: 0.3 mg/dL (ref 0.3–1.2)

## 2014-02-06 LAB — CBC
HCT: 26.1 % — ABNORMAL LOW (ref 39.0–52.0)
Hemoglobin: 8.8 g/dL — ABNORMAL LOW (ref 13.0–17.0)
MCH: 31.4 pg (ref 26.0–34.0)
MCHC: 33.7 g/dL (ref 30.0–36.0)
MCV: 93.2 fL (ref 78.0–100.0)
PLATELETS: 352 10*3/uL (ref 150–400)
RBC: 2.8 MIL/uL — ABNORMAL LOW (ref 4.22–5.81)
RDW: 15.7 % — ABNORMAL HIGH (ref 11.5–15.5)
WBC: 6.4 10*3/uL (ref 4.0–10.5)

## 2014-02-06 LAB — PREPARE RBC (CROSSMATCH)

## 2014-02-06 LAB — PHOSPHORUS: Phosphorus: 3 mg/dL (ref 2.3–4.6)

## 2014-02-06 LAB — MAGNESIUM: MAGNESIUM: 1.5 mg/dL (ref 1.5–2.5)

## 2014-02-06 MED ORDER — MORPHINE SULFATE 2 MG/ML IJ SOLN
1.0000 mg | Freq: Once | INTRAMUSCULAR | Status: AC
  Administered 2014-02-06: 1 mg via INTRAVENOUS

## 2014-02-06 MED ORDER — OXYCODONE-ACETAMINOPHEN 5-325 MG PO TABS: 1.0000 | ORAL_TABLET | ORAL | Status: DC | PRN

## 2014-02-06 MED ORDER — MORPHINE SULFATE 2 MG/ML IJ SOLN
1.0000 mg | INTRAMUSCULAR | Status: DC | PRN
  Administered 2014-02-06 – 2014-02-07 (×3): 1 mg via INTRAVENOUS
  Filled 2014-02-06 (×4): qty 1

## 2014-02-06 NOTE — Progress Notes (Signed)
Benjamin [name]  MRN: [id number] DOB/Age: 1951/06/07 63 y.o. Physician: Rada Hay Procedure: Procedure(s) (LRB): OPEN REDUCTION INTERNAL FIXATION (ORIF) LEFT  PROXIMAL HUMERUS FRACTURE (Left)     Subjective: Up eating lunch, reports pain as expected.   Vital Signs Temp:  [97.8 F (36.6 C)-98.5 F (36.9 C)] 98.5 F (36.9 C) (03/04 0547) Pulse Rate:  [92-101] 92 (03/04 0547) Resp:  [16-20] 20 (03/04 0547) BP: (140-191)/(68-87) 167/80 mmHg (03/04 0605) SpO2:  [98 %-100 %] 99 % (03/04 0547)  Lab Results  Recent Labs  02/05/14 0450 02/06/14 0615  WBC 4.4 6.4  HGB 9.8* 8.8*  HCT 28.5* 26.1*  PLT 329 352   BMET  Recent Labs  02/05/14 0450 02/06/14 0615  NA 130* 130*  K 3.8 3.5*  CL 98 96  CO2 24 23  GLUCOSE 98 171*  BUN 8 7  CREATININE 0.56 0.50  CALCIUM 8.0* 7.9*   INR  Date Value Ref Range Status  01/31/2014 1.28  0.00 - 1.49 Final     Exam Left shoulder dressing with moderate blood on aquacel but not wicked to borders Moving fingers well HGB down to 8.8        Plan S/p ORIF L humerus Anemia  Will transfuse 2 units as feel it will be beneficial to his recovery. OT today. From our standpoint when he is medically stable he could follow up with [place] as outpatient  [facility] 02/06/2014, 12:14 PM

## 2014-02-06 NOTE — Discharge Instructions (Signed)
OK to allow arm to dangle out of sling to move elbow wrist and hand OK to shower with aquacel dressing on No weight to arm with sling on when up and to sleep

## 2014-02-06 NOTE — Op Note (Signed)
Benjamin [name], Benjamin [name] NO.:  [id number]  MEDICAL RECORD NO.:  [id number]  LOCATION:  63                         FACILITY:  [facility]  PHYSICIAN:  [name]. Kipp Shank, M.D.  DATE OF BIRTH:  23-Jun-1951  DATE OF PROCEDURE:  02/05/2014 DATE OF DISCHARGE:                              OPERATIVE REPORT   PREOP DIAGNOSIS:  Severely displaced left 2-part proximal humerus fracture.  POSTOPERATIVE DIAGNOSIS:  Severely displaced left 2-part proximal humerus fracture.  PROCEDURE:  Open reduction and internal fixation of severely displaced left 2-part proximal humerus fracture.  SURGEON:  [name]. Shamanda Len, M.D.  Terrence DupontOlivia Mackie A. Shuford, PA-C.  ANESTHESIA:  General endotracheal as well as an interscalene block.  ESTIMATED BLOOD LOSS:  Approximately 100 mL.  DRAINS:  None.  HISTORY:  Mr. Huguley is a 63 year old gentleman with an extensive and complex past medical history, including chronic alcoholism and a recent admission for metabolic derangement and rhabdomyolysis related to a 3- day alcoholic binge with subsequent loss of consciousness.  Was found down and brought to the hospital for medical management and after a lengthy medical evaluation, treatment regimen was found to be metabolically stabilized to the point, where he was felt to be reasonable, surgical risk for management of a severely displaced left heart 2-part proximal humerus fracture.  Preoperatively counseled Mr. Spinella regarding treatment options, risks versus benefits thereof.  Possible surgical complications were all reviewed with potential for bleeding, infection, neurovascular injury, malunion, nonunion, loss of fixation, anesthetic complication, and possible need for additional surgery.  He understands and accepts and agrees to planned procedure.  PROCEDURE IN DETAIL:  After undergoing routine preop evaluation, an extensive medical management as outlined above.  The patient was brought to the  preop holding area and had a interscalene block established by the anesthesia department.  He did receive prophylactic antibiotics. Brought to the operating room and on his hospital bed,  underwent smooth induction of general endotracheal anesthesia.  Then placed in a supine position on radiolucent fracture table.  Left shoulder girdle region was sterilely prepped and draped in standard fashion.  Time-out was called. An anterior deltopectoral approach.  The left shoulder and proximal humerus made through 12 cm incision, centered about the fracture site. Skin flaps were elevated.  Electrocautery was used hemostasis.  The cephalic vein was identified retracted laterally with the deltoid.  The pectoralis major retracted medially with the adhesions divided beneath the deltoid and dissection carried distally such that the anterolateral aspect of the humeral shaft could be exposed and identified the bicipital groove on the humeral shaft.  The humeral head was completely displaced posteriorly and number of adhesions had developed.  These were all divided, mobilized allowing reduction of the fracture and there was an element of comminution posteriorly on the humeral head.  We were able to gain access circumferentially at the humeral neck region, allowing reapproximation of the shaft to the head.  At this point, we selected the 3-hole Biomet proximal humeral plate and temporarily transfixed this on the humeral shaft just lateral to the bicipital groove and then determine the appropriate level for fixation and temporarily fixed to the humeral head with  K-wires and a single screw distally.  We used fluoroscopic imaging to confirm that the overall position alignment was to our satisfaction.  Once this was completed, we went ahead and used the appropriate guide, placed a series of locking screws up into the humeral head, and completed the fixation of the plate to the humeral shaft with the 3.5 cortical  screws.  The first to be nonlocking, the second 2 were locking.  Of note, the patient's bone quality was somewhat soft as would be expected.  Given his underlying chronic medical issues. But overall felt good bony fixation was achieved with our construct. Once all the screws had been placed, we performed final radiographic evaluation, took shoulder range of motion, showing good position implant, good stability, and overall good alignment at the fracture site.  The wounds were copiously irrigated.  Hemostasis was obtained. The deltopectoral interval was reapproximated with a series of #1 figure- of-eight Vicryl sutures, 2-0 Vicryl used for the subcu layer and intracuticular 3-0 Monocryl for the skin followed by Steri-Strips.  Dry dressing was placed.  The shoulder, left arm was placed in a sling.  The patient was then awakened, extubated, and taken to recovery room in stable condition.     [name]. Daizee Firmin, M.D.     KMS/MEDQ  D:  02/05/2014  T:  02/05/2014  Job:  [id number]

## 2014-02-06 NOTE — Progress Notes (Signed)
CSW spoke with patient, he defers to brother to make snf choice. CSW called patient's brother, gave bed offers. Requesting [facility]. CS[facility] to begin auth process.  Edger Husain C. [place] MSW, [place]

## 2014-02-06 NOTE — Progress Notes (Signed)
Occupational Therapy Treatment Patient Details Name: Josecarlos Harriott MRN: [id number] DOB: 02/24/1951 Today's Date: 02/06/2014 Time: 1100-1147 OT Time Calculation (min): 47 min  OT Assessment / Plan / Recommendation  History of present illness 63 yo male admitted with DKA, fall suffering L prox humerus fx. Hx of ETOH abuse, DM, HTN. Pt lives alone per chart.  Pt did have ORIF per MD yesterday.  Orders indicate ADL education. AROM elbow, wrist and hand                           Precautions / Restrictions Precautions Required Braces or Orthoses: Sling Restrictions Weight Bearing Restrictions: Yes LUE Weight Bearing: Non weight bearing Other Position/Activity Restrictions: AROM L elbow, wrist and fingers       ADL  Transfers/Ambulation Related to ADLs: OT in bed and refused OOB at ths time.  Pt did have surgery yesterday. After much encouragement pt did agree to ROM L elbow, wrist and hand ADL Comments: Pt did perform ROM of LUE (uninvolved joints) see below      OT Goals(current goals can now be found in the care plan section)    Visit Information  Last OT Received On: 02/06/14 Assistance Needed: +2 History of Present Illness: 63 yo male admitted with DKA, fall suffering L prox humerus fx. Hx of ETOH abuse, DM, HTN. Pt lives alone per chart.  Pt did have ORIF per MD yesterday.  Orders indicate ADL education. AROM elbow, wrist and hand          Cognition  Cognition Arousal/Alertness: Awake/alert Behavior During Therapy: WFL for tasks assessed/performed Overall Cognitive Status: Within Functional Limits for tasks assessed    Mobility    refused   Exercises  Shoulder Exercises Elbow Flexion: AAROM;10 reps;Left Wrist Flexion: AAROM;Left;10 reps Wrist Extension: AAROM;Left;10 reps Digit Composite Flexion: AROM;Left;10 reps Composite Extension: AROM;Left;10 reps Pt needed increased time and encouragement to complete all exercise.      End of Session  Pt left in bed with  RN presents and lunch. OT did briefly go over one handed tecchniques- but will need to further address ADL activity when pt willing to get OOB.   GO     [name] 02/06/2014, 12:15 PM

## 2014-02-06 NOTE — Progress Notes (Signed)
PT Cancellation Note  Patient Details Name: [name] MRN: [id number] DOB: 06-06-1951   Cancelled Treatment:    Reason Eval/Treat Not Completed: Pain limiting ability to participate Pt reporting too much pain and time for pain meds so RN notified and PT later returned to room to attempt to work with pt however pt reports pain meds did not help as much as he had planned and he does not want to get OOB today.  Pt states he worked with his UEs today and did not want to do any further therapy.  PT explained the difference between OT and PT and that both would be working with him with different goals and that one of his PT goals is mobility and ambulation.  Pt further declined OOB today.    Amity Roes,KATHrine E 02/06/2014, 2:59 PM Carmelia Bake, PT, DPT 02/06/2014 Pager: [phone]

## 2014-02-06 NOTE — Progress Notes (Signed)
PROGRESS NOTE  Benjamin [name] [id number] DOB: 07/11/1951 DOA: 01/28/2014 PCP: [name], MD  HPI / Interval  history of alcohol abuse and peptic ulcer disease. Was last admitted to July with her upper GI bleed. Patient resides at home by himself. He is unable to provide his own history. Per ER notes and EMS patient was found down on the floor covered in his own feces. Patient was noted to have large amount of bruising to his left shoulder. He was brought then to emergency department where he was found to have surgical neck fracture left proximal humerus. Patient was noted to be hypothermic requiring. After receiving IV fluids is dramatically proved. Initially he was found to be hyperglycemic and was started on glucose stabilizer with good results. He is blood work was significant for metabolic acidosis and increased lactic acid. Patient is currently improving. Lipase initially was noted to be elevated to 200 range. Patient states that he drinks 4-5 glasses of wine a day was drink was on Friday which is 4 days ago patient history somewhat unreliable. Initially CCM was called to admission but defer to hospitalist.  Patient initially admitted to SDU with acute encephalopathy, rhabdomyolysis, lactic acidosis, DKA. Initially febrile due to likely lung source, s/p a 7 day course with Ceftriaxone finished 02/05/14. On admission he was found to have a displaced left proximal humerus fracture, which, after medical stabilization has been surgically addressed per orthopedic surgery.   Assessment/Plan:  Left proximal humerus fracture - ortho consulted, appreciate input.  - s/p OR 02/05/2014. Pain control and PT eval.  ETOH Abuse - On CIWA, withdrawing resolved. Patient seems determined to quit drinking. Left infrahilar density on CXR - CT without evidence of a mass, ?aspiration pneumonia. CT scan  is more convincing for pneumonia,. Completed 7 days of rocephin.  Acute encephalopathy - ETOH, elevated ammonia.  Improving.  - Continue lactulose, decreasing to once a day his mental status has improved significantly Hypertension - blood pressure trends upwards, started lisinopril 10 mg on 2/28, increased to 20 mg 3/3 DKA - off insulin gtt, continue s.q. Metabolic acidosis - improving UTI - patient febrile 2/24-2/25 night, UA positive. Ceftriaxone. Urine cultures negative DM - continue NPH and SSI PUD - continue PPI Rhabdo - CK improving.  Anemia - no apparent active bleeding, monitor. Hemoglobin slowly trending down to 7.3 on 2/27. Status post one unit transfusion. Hemoglobin improved and has remained stable at 8.8. Pancreatitis - clinically improving, carb diet  Diet: carb modified Fluids: NS DVT Prophylaxis: SCD  Code Status: Full Family Communication: None this morning Disposition Plan: inpatient, SNF when medically ready,  Consultants:  Orthopedic surgery  Procedures:  ORIF left proximal humerus fracture, Dr. Onnie Graham 02/05/14.   Antibiotics - Ceftriaxone 2/25>>3/3  HPI/Subjective: - Denies any chest pain, breathing difficulties this morning. Upbeat after surgery, . REPORT pain in the shoulder.   Objective: Filed Vitals:   02/05/14 2014 02/06/14 0203 02/06/14 0547 02/06/14 0605  BP: 140/68 176/80 191/80 167/80  Pulse: 101 98 92   Temp: 98.4 F (36.9 C) 97.8 F (36.6 C) 98.5 F (36.9 C)   TempSrc: Oral Oral Oral   Resp: 20 20 20    Height:      Weight:      SpO2: 98% 100% 99%     Intake/Output Summary (Last 24 hours) at 02/06/14 0832 Last data filed at 02/06/14 0827  Gross per 24 hour  Intake   3300 ml  Output   1855 ml  Net   1445  ml   Filed Weights   01/29/14 0930  Weight: 67.2 kg (148 lb 2.4 oz)   Exam:  General:  NAD  Cardiovascular: regular rate and rhythm, without MRG  Respiratory: good air movement, clear to auscultation throughout, no wheezing, ronchi or rales  Abdomen: soft, not tender to palpation, positive bowel sounds  MSK: no peripheral  edema Neuro: non focal  Data Reviewed: Basic Metabolic Panel:  Recent Labs Lab 01/31/14 0300  02/02/14 0527 02/03/14 0551 02/04/14 1425 02/05/14 0450 02/06/14 0615  NA 144  < > 132* 131* 132* 130* 130*  K 3.1*  < > 3.2* 3.2* 3.8 3.8 3.5*  CL 109  < > 97 95* 99 98 96  CO2 25  < > 22 24 23 24 23   GLUCOSE 161*  < > 170* 116* 202* 98 171*  BUN 22  < > 8 8 9 8 7   CREATININE 0.62  < > 0.48* 0.49* 0.47* 0.56 0.50  CALCIUM 8.1*  < > 8.0* 7.7* 7.8* 8.0* 7.9*  MG 2.0  --   --  1.5  --   --  1.5  PHOS 1.3*  --   --  2.1*  --   --  3.0  < > = values in this interval not displayed.  Liver Function Tests:  Recent Labs Lab 01/30/14 1322 01/31/14 0300 02/03/14 0551 02/06/14 0615  AST 111* 116* 29 22  ALT 92* 91* 46 30  ALKPHOS 372* 361* 258* 235*  BILITOT 0.8 0.6 0.4 0.3  PROT 5.1* 5.1* 4.6* 4.7*  ALBUMIN 2.2* 2.0* 1.9* 1.9*   No results found for this basename: LIPASE, AMYLASE,  in the last 168 hours No results found for this basename: AMMONIA,  in the last 168 hours CBC:  Recent Labs Lab 02/02/14 0527 02/03/14 0551 02/04/14 1425 02/05/14 0450 02/06/14 0615  WBC 2.9* 3.0* 4.2 4.4 6.4  HGB 9.3* 9.5* 9.5* 9.8* 8.8*  HCT 27.5* 26.7* 27.3* 28.5* 26.1*  MCV 93.5 91.4 92.5 92.5 93.2  PLT 162 198 302 329 352   Cardiac Enzymes: No results found for this basename: CKTOTAL, CKMB, CKMBINDEX, TROPONINI,  in the last 168 hours CBG:  Recent Labs Lab 02/04/14 0744 02/04/14 1154 02/04/14 1659 02/04/14 2215 02/05/14 1033  GLUCAP 142* 228* 152* 152* 84    Recent Results (from the past 240 hour(s))  MRSA PCR SCREENING     Status: None   Collection Time    01/29/14  9:09 AM      Result Value Ref Range Status   MRSA by PCR NEGATIVE  NEGATIVE Final   Comment:            The GeneXpert MRSA Assay (FDA     approved for NASAL specimens     only), is one component of a     comprehensive MRSA colonization     surveillance program. It is not     intended to diagnose MRSA      infection nor to guide or     monitor treatment for     MRSA infections.  URINE CULTURE     Status: None   Collection Time    02/01/14  7:01 AM      Result Value Ref Range Status   Specimen Description URINE, CLEAN CATCH   Final   Special Requests NONE   Final   Culture  Setup Time     Final   Value: 02/01/2014 11:24     Performed at [facility]  Colony Count     Final   Value: NO GROWTH     Performed at [facility]   Culture     Final   Value: NO GROWTH     Performed at [facility]   Report Status 02/02/2014 FINAL   Final  SURGICAL PCR SCREEN     Status: None   Collection Time    02/05/14  1:21 AM      Result Value Ref Range Status   MRSA, PCR NEGATIVE  NEGATIVE Final   Staphylococcus aureus NEGATIVE  NEGATIVE Final   Comment:            The Xpert SA Assay (FDA     approved for NASAL specimens     in patients over 42 years of age),     is one component of     a comprehensive surveillance     program.  Test performance has     been validated by [employer] for patients greater     than or equal to 68 year old.     It is not intended     to diagnose infection nor to     guide or monitor treatment.   Studies: Dg Shoulder Left  02/05/2014   CLINICAL DATA:  ORIF left shoulder.  EXAM: LEFT SHOULDER - 2+ VIEW  COMPARISON:  01/28/2014  FINDINGS: There is been internal fixation of patient's left humeral head/neck fracture with lateral fixation plate and screws bridging the fracture site. Hardware is intact as there is near anatomic alignment about the fracture site. There are mild degenerative changes of the glenohumeral joint. Recommend correlation with findings at the time of the procedure.  IMPRESSION: Fixation of patient's humeral head/neck fracture with hardware intact and near anatomic alignment about the fracture site.   Electronically Signed   By: Marin Olp M.D.   On: 02/05/2014 14:19   Dg C-arm 61-120 Min-no Report  02/05/2014   CLINICAL  DATA: ORIF shoulder   C-ARM 61-120 MINUTES  Fluoroscopy was utilized by the requesting physician.  No radiographic  interpretation.    Scheduled Meds: . cloNIDine  0.1 mg Oral TID  . docusate sodium  100 mg Oral BID  . feeding supplement (GLUCERNA SHAKE)  237 mL Oral BID BM  . ferrous sulfate  325 mg Oral TID WC  . folic acid  1 mg Oral Daily  . insulin aspart  0-5 Units Subcutaneous QHS  . insulin aspart  0-9 Units Subcutaneous TID WC  . insulin NPH Human  6 Units Subcutaneous BID AC & HS  . lactulose  10 g Oral Daily  . lisinopril  20 mg Oral Daily  . LORazepam  0-4 mg Intravenous Q4H  . pantoprazole  40 mg Oral Daily  . sodium chloride  10-40 mL Intracatheter Q12H  . sodium chloride  3 mL Intravenous Q12H  . sucralfate  1 g Oral TID WC & HS  . thiamine  100 mg Oral Daily   Continuous Infusions: . sodium chloride 1,000 mL (02/05/14 1130)  . lactated ringers Stopped (02/05/14 1837)   Active Problems:   ETOH abuse   Diabetes mellitus   Anemia   PUD (peptic ulcer disease)   DKA (diabetic ketoacidoses)   Pancreatitis   Metabolic acidosis   Humerus fracture   Rhabdomyolysis   Pancreatitis, acute   Acute encephalopathy   UTI (urinary tract infection)   Aspiration pneumonia   Essential hypertension, benign  Time  spent: [place], MD [facility] Pager [phone] If 7 PM - 7 AM, please contact night-coverage at [email or web address], password [facility] 02/06/2014, 8:32 AM  LOS: 9 days

## 2014-02-07 ENCOUNTER — Encounter (HOSPITAL_COMMUNITY): Payer: Self-pay | Admitting: Orthopedic Surgery

## 2014-02-07 DIAGNOSIS — S42213A Unspecified displaced fracture of surgical neck of unspecified humerus, initial encounter for closed fracture: Secondary | ICD-10-CM

## 2014-02-07 DIAGNOSIS — G9340 Encephalopathy, unspecified: Secondary | ICD-10-CM

## 2014-02-07 DIAGNOSIS — D649 Anemia, unspecified: Secondary | ICD-10-CM

## 2014-02-07 LAB — CBC WITH DIFFERENTIAL/PLATELET
BASOS PCT: 0 % (ref 0–1)
Basophils Absolute: 0 10*3/uL (ref 0.0–0.1)
EOS PCT: 1 % (ref 0–5)
Eosinophils Absolute: 0.1 10*3/uL (ref 0.0–0.7)
HEMATOCRIT: 32.2 % — AB (ref 39.0–52.0)
HEMOGLOBIN: 11 g/dL — AB (ref 13.0–17.0)
Lymphocytes Relative: 13 % (ref 12–46)
Lymphs Abs: 0.9 10*3/uL (ref 0.7–4.0)
MCH: 30.8 pg (ref 26.0–34.0)
MCHC: 34.2 g/dL (ref 30.0–36.0)
MCV: 90.2 fL (ref 78.0–100.0)
MONO ABS: 0.7 10*3/uL (ref 0.1–1.0)
Monocytes Relative: 10 % (ref 3–12)
NEUTROS ABS: 5.4 10*3/uL (ref 1.7–7.7)
Neutrophils Relative %: 76 % (ref 43–77)
Platelets: 335 10*3/uL (ref 150–400)
RBC: 3.57 MIL/uL — ABNORMAL LOW (ref 4.22–5.81)
RDW: 16.3 % — ABNORMAL HIGH (ref 11.5–15.5)
WBC: 7.1 10*3/uL (ref 4.0–10.5)

## 2014-02-07 LAB — GLUCOSE, CAPILLARY
GLUCOSE-CAPILLARY: 102 mg/dL — AB (ref 70–99)
GLUCOSE-CAPILLARY: 179 mg/dL — AB (ref 70–99)
GLUCOSE-CAPILLARY: 212 mg/dL — AB (ref 70–99)
Glucose-Capillary: 208 mg/dL — ABNORMAL HIGH (ref 70–99)
Glucose-Capillary: 148 mg/dL — ABNORMAL HIGH (ref 70–99)
Glucose-Capillary: 166 mg/dL — ABNORMAL HIGH (ref 70–99)
Glucose-Capillary: 194 mg/dL — ABNORMAL HIGH (ref 70–99)
Glucose-Capillary: 134 mg/dL — ABNORMAL HIGH (ref 70–99)
Glucose-Capillary: 77 mg/dL (ref 70–99)
Glucose-Capillary: 157 mg/dL — ABNORMAL HIGH (ref 70–99)
Glucose-Capillary: 166 mg/dL — ABNORMAL HIGH (ref 70–99)

## 2014-02-07 LAB — TYPE AND SCREEN
ABO/RH(D): O POS
Antibody Screen: NEGATIVE
UNIT DIVISION: 0
Unit division: 0

## 2014-02-07 MED ORDER — AMLODIPINE BESYLATE 5 MG PO TABS
5.0000 mg | ORAL_TABLET | Freq: Every day | ORAL | Status: DC
  Administered 2014-02-07 – 2014-02-09 (×3): 5 mg via ORAL
  Filled 2014-02-07 (×3): qty 1

## 2014-02-07 MED ORDER — CLONIDINE HCL 0.2 MG PO TABS
0.2000 mg | ORAL_TABLET | Freq: Three times a day (TID) | ORAL | Status: DC
  Administered 2014-02-07 – 2014-02-11 (×12): 0.2 mg via ORAL
  Filled 2014-02-07 (×14): qty 1

## 2014-02-07 NOTE — Progress Notes (Signed)
Benjamin [name]  MRN: [id number] DOB/Age: 1951-05-14 63 y.o. Physician: Ander Slade, M.D. 2 Days Post-Op Procedure(s) (LRB): OPEN REDUCTION INTERNAL FIXATION (ORIF) LEFT  PROXIMAL HUMERUS FRACTURE (Left)  Subjective: Sitting in bed eating dinner, good appetite, reports minimal shoulder pain at this time. Vital Signs Temp:  [98 F (36.7 C)-99.7 F (37.6 C)] 98 F (36.7 C) (03/05 1348) Pulse Rate:  [74-100] 100 (03/05 1348) Resp:  [16-20] 18 (03/05 1348) BP: (160-198)/(72-87) 186/84 mmHg (03/05 1348) SpO2:  [97 %-100 %] 100 % (03/05 1348)  Lab Results  Recent Labs  02/05/14 0450 02/06/14 0615  WBC 4.4 6.4  HGB 9.8* 8.8*  HCT 28.5* 26.1*  PLT 329 352   BMET  Recent Labs  02/05/14 0450 02/06/14 0615  NA 130* 130*  K 3.8 3.5*  CL 98 96  CO2 24 23  GLUCOSE 98 171*  BUN 8 7  CREATININE 0.56 0.50  CALCIUM 8.0* 7.9*   INR  Date Value Ref Range Status  01/31/2014 1.28  0.00 - 1.49 Final     Exam  Dressing with areas of dried blood but no complete saturation. Intact to light touch, good digital motion, moderate swelling but skin creases returning.  Plan H and H pending after transfusion last night. D/C planning for SNF OK for pendulums LUE, NWB LUE F/U my office approx. 2 weeks from surgery  [name] M 02/07/2014, 5:34 PM

## 2014-02-07 NOTE — Progress Notes (Signed)
NUTRITION FOLLOW UP  Intervention:   Continue with Glucerna shakes  Nutrition Dx:   Inadequate oral intake related to decreased appetite as evidenced by <50% intake of meals-improving   Goal:   Pt to meet >/= 90% of their estimated nutrition needs    Monitor:   PO intake, weight, labs   Assessment:   2/27:Patient with history of alcohol abuse, admitted after a fall. He was found to be hyperglycemic with metabolic acidosis. Patient reports that he drinks 4-5 glasses of wine a day. He reports that is appetite is fair, and has been so for a while. His weight is down 6% from his admission in July 2014.   3/05: Pt with improving appetite, is eating 75-100% of meals. -Drinking > 75% Glucerna supplements -Had questions/concern regarding current diet restrictions. Believes that portion sizes at hospital are too small. Discussed CHO limit at meals. Reported to follow DM diet at home, and was familiar with CHO counting and portion control.  -Pressure ulcers dressing dry and intact per doc flowsheet documentation -CBGs WNL -Low K -MD noted pt willing to quit drinking, which will also improve nutritional status  Height: Ht Readings from Last 1 Encounters:  01/29/14 5\' 9"  (1.753 m)    Weight Status:   Wt Readings from Last 1 Encounters:  01/29/14 148 lb 2.4 oz (67.2 kg)    Re-estimated needs:  Kcal: 1700 kcal  Protein: 80-95 g  Fluid: >2.3 L/day   Skin: Stage 2 pressure ulcer sacrum, deep tissue injury heel   Diet Order: Carb Control   Intake/Output Summary (Last 24 hours) at 02/07/14 1131 Last data filed at 02/07/14 0823  Gross per 24 hour  Intake 1293.75 ml  Output   2770 ml  Net -1476.25 ml    Last BM: 3/05   Labs:   Recent Labs Lab 02/03/14 0551 02/04/14 1425 02/05/14 0450 02/06/14 0615  NA 131* 132* 130* 130*  K 3.2* 3.8 3.8 3.5*  CL 95* 99 98 96  CO2 24 23 24 23   BUN 8 9 8 7   CREATININE 0.49* 0.47* 0.56 0.50  CALCIUM 7.7* 7.8* 8.0* 7.9*  MG 1.5  --    --  1.5  PHOS 2.1*  --   --  3.0  GLUCOSE 116* 202* 98 171*    CBG (last 3)   Recent Labs  02/04/14 1659 02/04/14 2215 02/05/14 1033  GLUCAP 152* 152* 84    Scheduled Meds: . cloNIDine  0.1 mg Oral TID  . docusate sodium  100 mg Oral BID  . feeding supplement (GLUCERNA SHAKE)  237 mL Oral BID BM  . ferrous sulfate  325 mg Oral TID WC  . folic acid  1 mg Oral Daily  . insulin aspart  0-5 Units Subcutaneous QHS  . insulin aspart  0-9 Units Subcutaneous TID WC  . insulin NPH Human  6 Units Subcutaneous BID AC & HS  . lactulose  10 g Oral Daily  . lisinopril  20 mg Oral Daily  . LORazepam  0-4 mg Intravenous Q4H  . pantoprazole  40 mg Oral Daily  . sodium chloride  10-40 mL Intracatheter Q12H  . sodium chloride  3 mL Intravenous Q12H  . sucralfate  1 g Oral TID WC & HS  . thiamine  100 mg Oral Daily    Continuous Infusions: . lactated ringers Stopped (02/05/14 1837)    Atlee Abide MS RD LDN Clinical Dietitian [phone]

## 2014-02-07 NOTE — Progress Notes (Signed)
PROGRESS NOTE  Benjamin [name] [id number] DOB: 02-Jun-1951 DOA: 01/28/2014 PCP: [name], MD  HPI / Interval  history of alcohol abuse and peptic ulcer disease. Was last admitted to July with her upper GI bleed. Patient resides at home by himself. He is unable to provide his own history. Per ER notes and EMS patient was found down on the floor covered in his own feces. Patient was noted to have large amount of bruising to his left shoulder. He was brought then to emergency department where he was found to have surgical neck fracture left proximal humerus. Patient was noted to be hypothermic requiring. After receiving IV fluids is dramatically proved. Initially he was found to be hyperglycemic and was started on glucose stabilizer with good results. He is blood work was significant for metabolic acidosis and increased lactic acid. Patient is currently improving. Lipase initially was noted to be elevated to 200 range. Patient states that he drinks 4-5 glasses of wine a day was drink was on Friday which is 4 days ago patient history somewhat unreliable. Initially CCM was called to admission but defer to hospitalist.  Patient initially admitted to SDU with acute encephalopathy, rhabdomyolysis, lactic acidosis, DKA. Initially febrile due to likely lung source, s/p a 7 day course with Ceftriaxone finished 02/05/14. On admission he was found to have a displaced left proximal humerus fracture, which, after medical stabilization has been surgically addressed per orthopedic surgery.   Assessment/Plan:  Left proximal humerus fracture - ortho consulted, appreciate input.  - s/p OR 02/05/2014. Pain control and PT eval.  ETOH Abuse - On CIWA, withdrawing resolved. Patient seems determined to quit drinking. Left infrahilar density on CXR - CT without evidence of a mass, ?aspiration pneumonia. CT scan  is more convincing for pneumonia,. Completed 7 days of rocephin.  Acute encephalopathy - ETOH, elevated ammonia.  Improving.  - Continue lactulose, decreasing to once a day his mental status has improved significantly Hypertension - blood pressure trends upwards, started lisinopril 10 mg on 2/28, increased to 20 mg 3/3 DKA - off insulin gtt, continue s.q. Metabolic acidosis - improving UTI - patient febrile 2/24-2/25 night, UA positive. Ceftriaxone. Urine cultures negative DM - continue NPH and SSI PUD - continue PPI Rhabdo - CK improving.  Anemia - no apparent active bleeding, monitor. Hemoglobin slowly trending down to 7.3 on 2/27. Status post one unit transfusion. Hemoglobin improved and has remained stable at 8.8. Pancreatitis - clinically improving, carb diet  Diet: carb modified Fluids: NS DVT Prophylaxis: SCD  Code Status: Full Family Communication: None this morning Disposition Plan: inpatient, SNF when medically ready,  Consultants:  Orthopedic surgery  Procedures:  ORIF left proximal humerus fracture, Dr. Onnie Graham 02/05/14.   Antibiotics - Ceftriaxone 2/25>>3/3  HPI/Subjective: - Denies any chest pain, breathing difficulties this morning. Upbeat after surgery, . REPORT pain in the shoulder.   Objective: Filed Vitals:   02/07/14 0040 02/07/14 0115 02/07/14 0602 02/07/14 1348  BP: 180/80 169/74 182/82 186/84  Pulse: 90 81 74 100  Temp: 99.7 F (37.6 C) 99.5 F (37.5 C) 98.7 F (37.1 C) 98 F (36.7 C)  TempSrc: Oral Oral Oral Oral  Resp: 18 18 20 18   Height:      Weight:      SpO2: 98% 97% 97% 100%    Intake/Output Summary (Last 24 hours) at 02/07/14 1720 Last data filed at 02/07/14 1446  Gross per 24 hour  Intake 921.25 ml  Output   2670 ml  Net -1748.75 ml  Filed Weights   01/29/14 0930  Weight: 67.2 kg (148 lb 2.4 oz)   Exam:  General:  NAD  Cardiovascular: regular rate and rhythm, without MRG  Respiratory: good air movement, clear to auscultation throughout, no wheezing, ronchi or rales  Abdomen: soft, not tender to palpation, positive bowel  sounds  MSK: no peripheral edema Neuro: non focal  Data Reviewed: Basic Metabolic Panel:  Recent Labs Lab 02/02/14 0527 02/03/14 0551 02/04/14 1425 02/05/14 0450 02/06/14 0615  NA 132* 131* 132* 130* 130*  K 3.2* 3.2* 3.8 3.8 3.5*  CL 97 95* 99 98 96  CO2 22 24 23 24 23   GLUCOSE 170* 116* 202* 98 171*  BUN 8 8 9 8 7   CREATININE 0.48* 0.49* 0.47* 0.56 0.50  CALCIUM 8.0* 7.7* 7.8* 8.0* 7.9*  MG  --  1.5  --   --  1.5  PHOS  --  2.1*  --   --  3.0    Liver Function Tests:  Recent Labs Lab 02/03/14 0551 02/06/14 0615  AST 29 22  ALT 46 30  ALKPHOS 258* 235*  BILITOT 0.4 0.3  PROT 4.6* 4.7*  ALBUMIN 1.9* 1.9*   No results found for this basename: LIPASE, AMYLASE,  in the last 168 hours No results found for this basename: AMMONIA,  in the last 168 hours CBC:  Recent Labs Lab 02/02/14 0527 02/03/14 0551 02/04/14 1425 02/05/14 0450 02/06/14 0615  WBC 2.9* 3.0* 4.2 4.4 6.4  HGB 9.3* 9.5* 9.5* 9.8* 8.8*  HCT 27.5* 26.7* 27.3* 28.5* 26.1*  MCV 93.5 91.4 92.5 92.5 93.2  PLT 162 198 302 329 352   Cardiac Enzymes: No results found for this basename: CKTOTAL, CKMB, CKMBINDEX, TROPONINI,  in the last 168 hours CBG:  Recent Labs Lab 02/06/14 1156 02/06/14 1650 02/06/14 2200 02/07/14 0726 02/07/14 1158  GLUCAP 166* 194* 134* 77 212*    Recent Results (from the past 240 hour(s))  MRSA PCR SCREENING     Status: None   Collection Time    01/29/14  9:09 AM      Result Value Ref Range Status   MRSA by PCR NEGATIVE  NEGATIVE Final   Comment:            The GeneXpert MRSA Assay (FDA     approved for NASAL specimens     only), is one component of a     comprehensive MRSA colonization     surveillance program. It is not     intended to diagnose MRSA     infection nor to guide or     monitor treatment for     MRSA infections.  URINE CULTURE     Status: None   Collection Time    02/01/14  7:01 AM      Result Value Ref Range Status   Specimen Description  URINE, CLEAN CATCH   Final   Special Requests NONE   Final   Culture  Setup Time     Final   Value: 02/01/2014 11:24     Performed at [facility]     Final   Value: NO GROWTH     Performed at [facility]   Culture     Final   Value: NO GROWTH     Performed at [facility]   Report Status 02/02/2014 FINAL   Final  SURGICAL PCR SCREEN     Status: None   Collection Time  02/05/14  1:21 AM      Result Value Ref Range Status   MRSA, PCR NEGATIVE  NEGATIVE Final   Staphylococcus aureus NEGATIVE  NEGATIVE Final   Comment:            The Xpert SA Assay (FDA     approved for NASAL specimens     in patients over 85 years of age),     is one component of     a comprehensive surveillance     program.  Test performance has     been validated by [employer] for patients greater     than or equal to 64 year old.     It is not intended     to diagnose infection nor to     guide or monitor treatment.   Studies: No results found. Scheduled Meds: . amLODipine  5 mg Oral Daily  . cloNIDine  0.2 mg Oral TID  . docusate sodium  100 mg Oral BID  . feeding supplement (GLUCERNA SHAKE)  237 mL Oral BID BM  . ferrous sulfate  325 mg Oral TID WC  . folic acid  1 mg Oral Daily  . insulin aspart  0-5 Units Subcutaneous QHS  . insulin aspart  0-9 Units Subcutaneous TID WC  . insulin NPH Human  6 Units Subcutaneous BID AC & HS  . lactulose  10 g Oral Daily  . lisinopril  20 mg Oral Daily  . LORazepam  0-4 mg Intravenous Q4H  . pantoprazole  40 mg Oral Daily  . sodium chloride  10-40 mL Intracatheter Q12H  . sodium chloride  3 mL Intravenous Q12H  . sucralfate  1 g Oral TID WC & HS  . thiamine  100 mg Oral Daily   Continuous Infusions: . lactated ringers Stopped (02/05/14 1837)   Active Problems:   ETOH abuse   Diabetes mellitus   Anemia   PUD (peptic ulcer disease)   DKA (diabetic ketoacidoses)   Pancreatitis   Metabolic acidosis    Humerus fracture   Rhabdomyolysis   Pancreatitis, acute   Acute encephalopathy   UTI (urinary tract infection)   Aspiration pneumonia   Essential hypertension, benign  Time spent: 25 Hosie Poisson, MD [facility] Pager [phone] If 7 PM - 7 AM, please contact night-coverage at [email or web address], password [facility] 02/07/2014, 5:20 PM  LOS: 10 days

## 2014-02-07 NOTE — Progress Notes (Signed)
Occupational Therapy Treatment Patient Details Name: Kien Mirsky MRN: [id number] DOB: 22-Jan-1951 Today's Date: 02/07/2014 Time: 8921-1941 OT Time Calculation (min): 24 min  OT Assessment / Plan / Recommendation  History of present illness 63 yo male admitted with DKA, fall suffering L prox humerus fx. Hx of ETOH abuse, DM, HTN. Pt lives alone per chart.  Pt did have ORIF per MD yesterday.  Orders indicate ADL education. AROM elbow, wrist and hand   OT comments  Pt did particpate and get OOb with much encouragement  Follow Up Recommendations  SNF;Supervision/Assistance - 24 hour       Equipment Recommendations  3 in 1 bedside comode;Wheelchair (measurements OT);Wheelchair cushion (measurements OT);Hospital bed       Frequency Min 2X/week         Precautions / Restrictions Precautions Required Braces or Orthoses: Sling Restrictions Weight Bearing Restrictions: Yes LUE Weight Bearing: Non weight bearing Other Position/Activity Restrictions: AROM L elbow, wrist and fingers       ADL  Transfers/Ambulation Related to ADLs: Educated pt on dressing techniques, as well as proper positioning of the sling.  Educated pt on importance of getting up as pt did refuse PT yesterday.  OT explained difference between OT and Pt for pt.   ADL Comments: Pt did perform ROM of LUE (uninvolved joints) see below      OT Goals(current goals can now be found in the care plan section) Acute Rehab OT Goals Patient Stated Goal: none stated at this time OT Goal Formulation: With patient/family Time For Goal Achievement: 02/17/14 Potential to Achieve Goals: Good  Visit Information  Last OT Received On: 02/07/14 History of Present Illness: 63 yo male admitted with DKA, fall suffering L prox humerus fx. Hx of ETOH abuse, DM, HTN. Pt lives alone per chart.  Pt did have ORIF per MD yesterday.  Orders indicate ADL education. AROM elbow, wrist and hand          Cognition  Cognition Arousal/Alertness:  Awake/alert Behavior During Therapy: WFL for tasks assessed/performed Overall Cognitive Status: Within Functional Limits for tasks assessed    Mobility  Bed Mobility Overal bed mobility: Needs Assistance Bed Mobility: Supine to Sit Supine to sit: HOB elevated;Mod assist General bed mobility comments: cues for sequence and guarding LT UE  Transfers Overall transfer level: Needs assistance Sit to Stand: +2 safety/equipment;+2 physical assistance;From elevated surface;Max assist Stand pivot transfers: +2 physical assistance;+2 safety/equipment;Max assist General transfer comment: Highly elevated surface. Assist to rise, stabilize, control descent. Used hemi-walker. VCS safety, posture, technique, distance from walker.     Exercises  Shoulder Exercises Elbow Flexion: AAROM;10 reps;Left Wrist Flexion: AAROM;Left;10 reps Wrist Extension: AAROM;Left;10 reps Digit Composite Flexion: AROM;Left;10 reps Composite Extension: AROM;Left;10 reps      End of Session OT - End of Session Activity Tolerance: Patient tolerated treatment well Patient left: in chair;with call bell/phone within reach Nurse Communication: Mobility status;Precautions;Weight bearing status  GO     [name] 02/07/2014, 11:59 AM

## 2014-02-08 LAB — GLUCOSE, CAPILLARY
GLUCOSE-CAPILLARY: 169 mg/dL — AB (ref 70–99)
Glucose-Capillary: 65 mg/dL — ABNORMAL LOW (ref 70–99)
Glucose-Capillary: 115 mg/dL — ABNORMAL HIGH (ref 70–99)
Glucose-Capillary: 145 mg/dL — ABNORMAL HIGH (ref 70–99)
Glucose-Capillary: 118 mg/dL — ABNORMAL HIGH (ref 70–99)

## 2014-02-08 LAB — CBC
HEMATOCRIT: 30.1 % — AB (ref 39.0–52.0)
HEMOGLOBIN: 10.4 g/dL — AB (ref 13.0–17.0)
MCH: 31.2 pg (ref 26.0–34.0)
MCHC: 34.6 g/dL (ref 30.0–36.0)
MCV: 90.4 fL (ref 78.0–100.0)
Platelets: 332 10*3/uL (ref 150–400)
RBC: 3.33 MIL/uL — AB (ref 4.22–5.81)
RDW: 16.2 % — ABNORMAL HIGH (ref 11.5–15.5)
WBC: 4.9 10*3/uL (ref 4.0–10.5)

## 2014-02-08 NOTE — Progress Notes (Signed)
Occupational Therapy Treatment Patient Details Name: Benjamin [name] MRN: [id number] DOB: [birth year] Today's Date: 02/08/2014    OT Assessment / Plan / Recommendation  History of present illness 63 yo male s/p L ORIF humerus.    OT comments  Pt is improving!  Follow Up Recommendations  SNF       Equipment Recommendations  None recommended by OT       Frequency Min 2X/week   Progress towards OT Goals Progress towards OT goals: Progressing toward goals (goal added)  Plan Discharge plan remains appropriate    Precautions / Restrictions Precautions Precautions: Fall;Shoulder Type of Shoulder Precautions: NWB L UE. Pendulums allowed per ortho.  Shoulder Interventions: Shoulder sling/immobilizer Restrictions Weight Bearing Restrictions: Yes LUE Weight Bearing: Non weight bearing       ADL  Grooming: Minimal assistance;Min guard;Denture care;Other (comment) (washing hair with cap) Where Assessed - Grooming: Unsupported sitting Upper Body Dressing: Moderate assistance;Other (comment) (education regarding sling) Transfers/Ambulation Related to ADLs: Pt much improved this OT visit!  MD added pendulum exercise which pt was able to tolerate in standing with hemi walker     OT Goals(current goals can now be found in the care plan section) Acute Rehab OT Goals Patient Stated Goal: go re rehab OT Goal Formulation: With patient  Visit Information  Last OT Received On: 02/08/14 Assistance Needed: +2 History of Present Illness: 63 yo male s/p L ORIF humerus.           Cognition  Cognition Arousal/Alertness: Awake/alert Behavior During Therapy: WFL for tasks assessed/performed Overall Cognitive Status: Within Functional Limits for tasks assessed    Mobility  Bed Mobility Overal bed mobility: Needs Assistance Bed Mobility: Supine to Sit Supine to sit: HOB elevated;Min assist Sit to supine: Min assist General bed mobility comments: cues for sequence and guarding LT UE   Transfers Overall transfer level: Needs assistance Equipment used: Hemi-walker Transfers: Sit to/from Stand Sit to Stand: +2 safety/equipment;+2 physical assistance;From elevated surface;Mod assist Stand pivot transfers: +2 physical assistance;+2 safety/equipment;Mod assist General transfer comment: Highly elevated surface. Assist to rise, stabilize, control descent. Used hemi-walker. VCS safety, posture, technique, distance from walker.     Exercises  General Exercises - Lower Extremity Ankle Circles/Pumps: AROM;Both;20 reps;Seated Long Arc Quad: AROM;Both;20 reps;Seated Hip Flexion/Marching: AROM;Both;10 reps;Seated Shoulder Exercises Pendulum Exercise: Left;10 reps;Standing;Other reps (comment) Elbow Flexion: AAROM;10 reps;Left Wrist Flexion: AAROM;Left;10 reps Wrist Extension: AAROM;Left;10 reps Digit Composite Flexion: AROM;Left;10 reps Composite Extension: AROM;Left;10 reps Donning/doffing sling/immobilizer: Moderate assistance Correct positioning of sling/immobilizer: Moderate assistance Pendulum exercises (written home exercise program): Maximal assistance ROM for elbow, wrist and digits of operated UE: Minimal assistance Sling wearing schedule (on at all times/off for ADL's): Minimal assistance      End of Session OT - End of Session Activity Tolerance: Patient tolerated treatment well Patient left: in chair;with call bell/phone within reach Nurse Communication: Mobility status;Precautions;Weight bearing status  GO     [name] 02/08/2014, 12:23 PM

## 2014-02-08 NOTE — Progress Notes (Signed)
CSW received call from [place] at [place]. patietn does have [employer] however not in network with the facility chosen by family previouslly. Per unit CSW, patient was refaxed out for placement with correct insurance information. Weekend csw to follow up with offers. Unit csw to follow up with [facility] on Monday to complete insurance authorization.  Noreene Larsson [phone]  ED CSW 02/08/2014 1728pm

## 2014-02-08 NOTE — Progress Notes (Addendum)
CSW refaxed patient out in order to find facility that contracts with [place]. [facility]osen facility[facility], is trying to get in touch with [facility] to see if they can still accept patient. [facility] has not called them back either as of this writing.  Haji Delaine C. [place] MSW, [place] [facility] does not [facility] patient. Patient's brother was able to find this number for [facility], [phone], CSW called this number, after speaking with customer service for approximately 45 minutes, CSW was given a fax number to fax information to. [phone] att [place] faxed same.  Roni Friberg C. Randall MSW, [place]

## 2014-02-08 NOTE — Progress Notes (Addendum)
CSW spoke with [facility]. Patient's policy termed feb 55DD. CSW spoke with patient. He states his policy changed march 1st and his brother has new info. Communicated this to [facility]. They will get new info and resubmit.  Shaterra Sanzone C. [place] MSW, [place] After speaking with the brother and [employer], there is still showing no open policy. Brother is calling [employer] to try and figure out the problem.  Joi Leyva C. Why MSW, [place] CSW went in patient's room with unit director, Izora Ribas in order to look in patient's belongings with his permission for his wallet. No wallet was found. CSW talked with brother, he is speaking with [employer] and trying to figure out what the problem is.  Elleanor Guyett C. Mike Gip MSW, [place] Patient has [employer] as of March 1. CSW submitted new information, called Hassan Rowan from [facility] and left voicemail asking for a return call asap. CSW faxed in information.  Raizy Auzenne C. [place] MSW, [place]

## 2014-02-08 NOTE — Progress Notes (Signed)
PROGRESS NOTE  Benjamin [name] GUR:[id number] DOB: [birth year] DOA: 01/28/2014 PCP: [name], MD  HPI / Interval  history of alcohol abuse and peptic ulcer disease. Was last admitted to July with her upper GI bleed. Patient resides at home by himself. He is unable to provide his own history. Per ER notes and EMS patient was found down on the floor covered in his own feces. Patient was noted to have large amount of bruising to his left shoulder. He was brought then to emergency department where he was found to have surgical neck fracture left proximal humerus. Patient was noted to be hypothermic requiring. After receiving IV fluids is dramatically proved. Initially he was found to be hyperglycemic and was started on glucose stabilizer with good results. He is blood work was significant for metabolic acidosis and increased lactic acid. Patient is currently improving. Lipase initially was noted to be elevated to 200 range. Patient states that he drinks 4-5 glasses of wine a day was drink was on Friday which is 4 days ago patient history somewhat unreliable. Initially CCM was called to admission but defer to hospitalist.  Patient initially admitted to SDU with acute encephalopathy, rhabdomyolysis, lactic acidosis, DKA. Initially febrile due to likely lung source, s/p a 7 day course with Ceftriaxone finished 02/05/14. On admission he was found to have a displaced left proximal humerus fracture, which, after medical stabilization has been surgically addressed per orthopedic surgery.   Assessment/Plan:  Left proximal humerus fracture - ortho consulted, appreciate input.  - s/p OR 02/05/2014. Pain control and PT eval.  ETOH Abuse - On CIWA, withdrawing resolved. Patient seems determined to quit drinking. Left infrahilar density on CXR - CT without evidence of a mass, ?aspiration pneumonia. CT scan  is more convincing for pneumonia,. Completed 7 days of rocephin.  Acute encephalopathy - ETOH, elevated ammonia.  Improving.  - Continue lactulose, decreasing to once a day his mental status has improved significantly Hypertension - blood pressure trends upwards, started lisinopril 10 mg on 2/28, increased to 20 mg 3/3 DKA - off insulin gtt, continue s.q. Metabolic acidosis - improving UTI - patient febrile 2/24-2/25 night, UA positive. Ceftriaxone. Urine cultures negative DM - continue NPH and SSI PUD - continue PPI Rhabdo - CK improving.  Anemia - no apparent active bleeding, monitor. Hemoglobin slowly trending down to 7.3 on 2/27. Status post two unit transfusion. Hemoglobin improved and has remained stable. Pancreatitis - clinically improving, carb diet  Diet: carb modified Fluids: NS DVT Prophylaxis: SCD  Code Status: Full Family Communication: None this morning Disposition Plan: inpatient, SNF when medically ready,  Consultants:  Orthopedic surgery  Procedures:  ORIF left proximal humerus fracture, Dr. Onnie Graham 02/05/14.   Antibiotics - Ceftriaxone 2/25>>3/3  HPI/Subjective: - Denies any chest pain, breathing difficulties this morning. Upbeat after surgery, . REPORT pain in the shoulder.   Objective: Filed Vitals:   02/08/14 0457 02/08/14 1001 02/08/14 1431 02/08/14 1638  BP: 158/77 164/78 138/67 178/90  Pulse: 69  84   Temp: 98.6 F (37 C)  98.3 F (36.8 C)   TempSrc: Oral  Oral   Resp: 18  19   Height:      Weight:      SpO2: 100%  100%     Intake/Output Summary (Last 24 hours) at 02/08/14 1937 Last data filed at 02/08/14 1922  Gross per 24 hour  Intake      0 ml  Output   1726 ml  Net  -1726 ml  Filed Weights   01/29/14 0930  Weight: 67.2 kg (148 lb 2.4 oz)   Exam:  General:  NAD  Cardiovascular: regular rate and rhythm, without MRG  Respiratory: good air movement, clear to auscultation throughout, no wheezing, ronchi or rales  Abdomen: soft, not tender to palpation, positive bowel sounds  MSK: no peripheral edema Neuro: non focal  Data  Reviewed: Basic Metabolic Panel:  Recent Labs Lab 02/02/14 0527 02/03/14 0551 02/04/14 1425 02/05/14 0450 02/06/14 0615  NA 132* 131* 132* 130* 130*  K 3.2* 3.2* 3.8 3.8 3.5*  CL 97 95* 99 98 96  CO2 22 24 23 24 23   GLUCOSE 170* 116* 202* 98 171*  BUN 8 8 9 8 7   CREATININE 0.48* 0.49* 0.47* 0.56 0.50  CALCIUM 8.0* 7.7* 7.8* 8.0* 7.9*  MG  --  1.5  --   --  1.5  PHOS  --  2.1*  --   --  3.0    Liver Function Tests:  Recent Labs Lab 02/03/14 0551 02/06/14 0615  AST 29 22  ALT 46 30  ALKPHOS 258* 235*  BILITOT 0.4 0.3  PROT 4.6* 4.7*  ALBUMIN 1.9* 1.9*   No results found for this basename: LIPASE, AMYLASE,  in the last 168 hours No results found for this basename: AMMONIA,  in the last 168 hours CBC:  Recent Labs Lab 02/04/14 1425 02/05/14 0450 02/06/14 0615 02/07/14 1945 02/08/14 0400  WBC 4.2 4.4 6.4 7.1 4.9  NEUTROABS  --   --   --  5.4  --   HGB 9.5* 9.8* 8.8* 11.0* 10.4*  HCT 27.3* 28.5* 26.1* 32.2* 30.1*  MCV 92.5 92.5 93.2 90.2 90.4  PLT 302 329 352 335 332   Cardiac Enzymes: No results found for this basename: CKTOTAL, CKMB, CKMBINDEX, TROPONINI,  in the last 168 hours CBG:  Recent Labs Lab 02/07/14 2056 02/08/14 0726 02/08/14 0816 02/08/14 1203 02/08/14 1651  GLUCAP 166* 65* 115* 145* 118*    Recent Results (from the past 240 hour(s))  URINE CULTURE     Status: None   Collection Time    02/01/14  7:01 AM      Result Value Ref Range Status   Specimen Description URINE, CLEAN CATCH   Final   Special Requests NONE   Final   Culture  Setup Time     Final   Value: 02/01/2014 11:24     Performed at [facility] Count     Final   Value: NO GROWTH     Performed at [facility]   Culture     Final   Value: NO GROWTH     Performed at [facility]   Report Status 02/02/2014 FINAL   Final  SURGICAL PCR SCREEN     Status: None   Collection Time    02/05/14  1:21 AM      Result Value Ref Range Status    MRSA, PCR NEGATIVE  NEGATIVE Final   Staphylococcus aureus NEGATIVE  NEGATIVE Final   Comment:            The Xpert SA Assay (FDA     approved for NASAL specimens     in patients over 7 years of age),     is one component of     a comprehensive surveillance     program.  Test performance has     been validated by [employer] for patients  greater     than or equal to 19 year old.     It is not intended     to diagnose infection nor to     guide or monitor treatment.   Studies: No results found. Scheduled Meds: . amLODipine  5 mg Oral Daily  . cloNIDine  0.2 mg Oral TID  . docusate sodium  100 mg Oral BID  . feeding supplement (GLUCERNA SHAKE)  237 mL Oral BID BM  . ferrous sulfate  325 mg Oral TID WC  . folic acid  1 mg Oral Daily  . insulin aspart  0-5 Units Subcutaneous QHS  . insulin aspart  0-9 Units Subcutaneous TID WC  . insulin NPH Human  6 Units Subcutaneous BID AC & HS  . lactulose  10 g Oral Daily  . lisinopril  20 mg Oral Daily  . LORazepam  0-4 mg Intravenous Q4H  . pantoprazole  40 mg Oral Daily  . sodium chloride  10-40 mL Intracatheter Q12H  . sodium chloride  3 mL Intravenous Q12H  . sucralfate  1 g Oral TID WC & HS  . thiamine  100 mg Oral Daily   Continuous Infusions: . lactated ringers Stopped (02/05/14 1837)   Active Problems:   ETOH abuse   Diabetes mellitus   Anemia   PUD (peptic ulcer disease)   DKA (diabetic ketoacidoses)   Pancreatitis   Metabolic acidosis   Humerus fracture   Rhabdomyolysis   Pancreatitis, acute   Acute encephalopathy   UTI (urinary tract infection)   Aspiration pneumonia   Essential hypertension, benign  Time spent: 25 Hosie Poisson, MD [facility] Pager [phone] If 7 PM - 7 AM, please contact night-coverage at [email or web address], password [email or web address] 02/08/2014, 7:37 PM  LOS: 11 days

## 2014-02-08 NOTE — Progress Notes (Signed)
Hypoglycemic Event  CBG:65  Treatment: 15 GM carbohydrate snack  Symptoms: Hungry  Follow-up CBG: [id number] CBG Result:116  Possible Reasons for Event: Unknown  Comments/MD notified:Dr. Milus Glazier  Remember to initiate Hypoglycemia Order Set & complete

## 2014-02-08 NOTE — Progress Notes (Signed)
Physical Therapy Treatment Patient Details Name: Habeeb Puertas MRN: [id number] DOB: [birth year] Today's Date: 02/08/2014 Time: 0300-9233 PT Time Calculation (min): 33 min  PT Assessment / Plan / Recommendation  History of Present Illness 63 yo male s/p L ORIF humerus.    PT Comments   Progressing with mobility. Pt motivated to mobilize this session. Remains deconditioned. Will need ST rehab at SNF to improve strength, activity tolerance, gait and balance.   Follow Up Recommendations  SNF     Does the patient have the potential to tolerate intense rehabilitation     Barriers to Discharge        Equipment Recommendations  None recommended by PT    Recommendations for Other Services OT consult  Frequency Min 3X/week   Progress towards PT Goals Progress towards PT goals: Progressing toward goals  Plan Current plan remains appropriate    Precautions / Restrictions Precautions Precautions: Fall;Shoulder Type of Shoulder Precautions: NWB L UE. Pendulums allowed per ortho.  Shoulder Interventions: Shoulder sling/immobilizer Restrictions Weight Bearing Restrictions: Yes LUE Weight Bearing: Non weight bearing   Pertinent Vitals/Pain 4/10 L UE    Mobility  Bed Mobility Overal bed mobility: Needs Assistance Bed Mobility: Supine to Sit Supine to sit: HOB elevated;Min assist Sit to supine: Min assist General bed mobility comments: cues for sequence and guarding LT UE  Transfers Overall transfer level: Needs assistance Equipment used: Hemi-walker Transfers: Sit to/from Stand Sit to Stand: +2 safety/equipment;+2 physical assistance;From elevated surface;Mod assist Stand pivot transfers: +2 physical assistance;+2 safety/equipment;Mod assist General transfer comment: Highly elevated surface. Assist to rise, stabilize, control descent. Used hemi-walker. VCS safety, posture, technique, distance from walker.  Ambulation/Gait Ambulation/Gait assistance: Min assist;+2 physical assistance;+2  safety/equipment Ambulation Distance (Feet): 9 Feet Assistive device: Hemi-walker Gait Pattern/deviations: Step-to pattern;Trunk flexed;Decreased stride length;Decreased step length - left;Decreased step length - right General Gait Details: Assist to stabilize and maneuver with walker. Fatigues very easily. Unsteady.Followed with recliner. VCs safety, posture, distance from walker.     Exercises General Exercises - Lower Extremity Ankle Circles/Pumps: AROM;Both;20 reps;Seated Long Arc Quad: AROM;Both;20 reps;Seated Hip Flexion/Marching: AROM;Both;10 reps;Seated   PT Diagnosis:    PT Problem List:   PT Treatment Interventions:     PT Goals (current goals can now be found in the care plan section)    Visit Information  Last PT Received On: 02/08/14 Assistance Needed: +2 History of Present Illness: 63 yo male s/p L ORIF humerus.     Subjective Data      Cognition  Cognition Arousal/Alertness: Awake/alert Behavior During Therapy: WFL for tasks assessed/performed Overall Cognitive Status: Within Functional Limits for tasks assessed    Balance     End of Session PT - End of Session Equipment Utilized During Treatment: Gait belt Activity Tolerance: Patient limited by fatigue;Patient limited by pain Patient left: in chair;with call bell/phone within reach   GP     Weston Anna, MPT Pager: [phone]

## 2014-02-09 LAB — GLUCOSE, CAPILLARY
GLUCOSE-CAPILLARY: 184 mg/dL — AB (ref 70–99)
Glucose-Capillary: 207 mg/dL — ABNORMAL HIGH (ref 70–99)
Glucose-Capillary: 228 mg/dL — ABNORMAL HIGH (ref 70–99)
Glucose-Capillary: 214 mg/dL — ABNORMAL HIGH (ref 70–99)

## 2014-02-09 MED ORDER — AMLODIPINE BESYLATE 10 MG PO TABS
10.0000 mg | ORAL_TABLET | Freq: Every day | ORAL | Status: DC
  Administered 2014-02-10 – 2014-02-11 (×2): 10 mg via ORAL
  Filled 2014-02-09 (×2): qty 1

## 2014-02-09 NOTE — Progress Notes (Signed)
PROGRESS NOTE  Benjamin [name] [id number] DOB: 1951/01/28 DOA: 01/28/2014 PCP: [name], MD  HPI / Interval  history of alcohol abuse and peptic ulcer disease. Was last admitted to July with her upper GI bleed. Patient resides at home by himself. He is unable to provide his own history. Per ER notes and EMS patient was found down on the floor covered in his own feces. Patient was noted to have large amount of bruising to his left shoulder. He was brought then to emergency department where he was found to have surgical neck fracture left proximal humerus. Patient was noted to be hypothermic requiring. After receiving IV fluids is dramatically proved. Initially he was found to be hyperglycemic and was started on glucose stabilizer with good results. He is blood work was significant for metabolic acidosis and increased lactic acid. Patient is currently improving. Lipase initially was noted to be elevated to 200 range. Patient states that he drinks 4-5 glasses of wine a day was drink was on Friday which is 4 days ago patient history somewhat unreliable. Initially CCM was called to admission but defer to hospitalist.  Patient initially admitted to SDU with acute encephalopathy, rhabdomyolysis, lactic acidosis, DKA. Initially febrile due to likely lung source, s/p a 7 day course with Ceftriaxone finished 02/05/14. On admission he was found to have a displaced left proximal humerus fracture, which, after medical stabilization has been surgically addressed per orthopedic surgery.   Assessment/Plan:  Left proximal humerus fracture - ortho consulted, appreciate input.  - s/p OR 02/05/2014. Pain control and PT eval.  ETOH Abuse - On CIWA, withdrawing resolved. Patient seems determined to quit drinking. Left infrahilar density on CXR - CT without evidence of a mass, ?aspiration pneumonia. CT scan  is more convincing for pneumonia,. Completed 7 days of rocephin.  Acute encephalopathy - ETOH, elevated ammonia.  Improving.  - Continue lactulose, decreasing to once a day his mental status has improved significantly Hypertension - blood pressure trends upwards, started lisinopril 10 mg on 2/28, increased to 20 mg 3/3, added norvasc.  DKA - off insulin gtt, continue s.q. Metabolic acidosis - improving UTI - patient febrile 2/24-2/25 night, UA positive. Ceftriaxone. Urine cultures negative DM - continue NPH and SSI PUD - continue PPI Rhabdo - CK improving.  Anemia - no apparent active bleeding, monitor. Hemoglobin slowly trending down to 7.3 on 2/27. Status post two unit transfusion. Hemoglobin improved and has remained stable. Pancreatitis - clinically improving, carb diet  Diet: carb modified Fluids: NS DVT Prophylaxis: SCD  Code Status: Full Family Communication: None this morning Disposition Plan: inpatient, SNF when medically ready,  Consultants:  Orthopedic surgery  Procedures:  ORIF left proximal humerus fracture, Dr. Onnie Graham 02/05/14.   Antibiotics - Ceftriaxone 2/25>>3/3  HPI/Subjective: - Denies any chest pain, breathing difficulties this morning. Upbeat after surgery, . REPORT pain in the shoulder.   Objective: Filed Vitals:   02/09/14 0003 02/09/14 0437 02/09/14 1004 02/09/14 1346  BP: 164/73 162/76 175/85 143/70  Pulse:  70    Temp:  98.6 F (37 C)    TempSrc:  Oral    Resp:  18    Height:      Weight:      SpO2:  99%      Intake/Output Summary (Last 24 hours) at 02/09/14 1651 Last data filed at 02/09/14 0440  Gross per 24 hour  Intake      0 ml  Output    985 ml  Net   -985 ml  Filed Weights   01/29/14 0930  Weight: 67.2 kg (148 lb 2.4 oz)   Exam:  General:  NAD  Cardiovascular: regular rate and rhythm, without MRG  Respiratory: good air movement, clear to auscultation throughout, no wheezing, ronchi or rales  Abdomen: soft, not tender to palpation, positive bowel sounds  MSK: no peripheral edema Neuro: non focal  Data Reviewed: Basic  Metabolic Panel:  Recent Labs Lab 02/03/14 0551 02/04/14 1425 02/05/14 0450 02/06/14 0615  NA 131* 132* 130* 130*  K 3.2* 3.8 3.8 3.5*  CL 95* 99 98 96  CO2 24 23 24 23   GLUCOSE 116* 202* 98 171*  BUN 8 9 8 7   CREATININE 0.49* 0.47* 0.56 0.50  CALCIUM 7.7* 7.8* 8.0* 7.9*  MG 1.5  --   --  1.5  PHOS 2.1*  --   --  3.0    Liver Function Tests:  Recent Labs Lab 02/03/14 0551 02/06/14 0615  AST 29 22  ALT 46 30  ALKPHOS 258* 235*  BILITOT 0.4 0.3  PROT 4.6* 4.7*  ALBUMIN 1.9* 1.9*   No results found for this basename: LIPASE, AMYLASE,  in the last 168 hours No results found for this basename: AMMONIA,  in the last 168 hours CBC:  Recent Labs Lab 02/04/14 1425 02/05/14 0450 02/06/14 0615 02/07/14 1945 02/08/14 0400  WBC 4.2 4.4 6.4 7.1 4.9  NEUTROABS  --   --   --  5.4  --   HGB 9.5* 9.8* 8.8* 11.0* 10.4*  HCT 27.3* 28.5* 26.1* 32.2* 30.1*  MCV 92.5 92.5 93.2 90.2 90.4  PLT 302 329 352 335 332   Cardiac Enzymes: No results found for this basename: CKTOTAL, CKMB, CKMBINDEX, TROPONINI,  in the last 168 hours CBG:  Recent Labs Lab 02/08/14 1203 02/08/14 1651 02/08/14 2158 02/09/14 0712 02/09/14 1231  GLUCAP 145* 118* 169* 184* 207*    Recent Results (from the past 240 hour(s))  URINE CULTURE     Status: None   Collection Time    02/01/14  7:01 AM      Result Value Ref Range Status   Specimen Description URINE, CLEAN CATCH   Final   Special Requests NONE   Final   Culture  Setup Time     Final   Value: 02/01/2014 11:24     Performed at [facility]     Final   Value: NO GROWTH     Performed at [facility]   Culture     Final   Value: NO GROWTH     Performed at [facility]   Report Status 02/02/2014 FINAL   Final  SURGICAL PCR SCREEN     Status: None   Collection Time    02/05/14  1:21 AM      Result Value Ref Range Status   MRSA, PCR NEGATIVE  NEGATIVE Final   Staphylococcus aureus NEGATIVE   NEGATIVE Final   Comment:            The Xpert SA Assay (FDA     approved for NASAL specimens     in patients over 37 years of age),     is one component of     a comprehensive surveillance     program.  Test performance has     been validated by [employer] for patients greater     than or equal to 24 year old.  It is not intended     to diagnose infection nor to     guide or monitor treatment.   Studies: No results found. Scheduled Meds: . [START ON 02/10/2014] amLODipine  10 mg Oral Daily  . cloNIDine  0.2 mg Oral TID  . docusate sodium  100 mg Oral BID  . feeding supplement (GLUCERNA SHAKE)  237 mL Oral BID BM  . ferrous sulfate  325 mg Oral TID WC  . folic acid  1 mg Oral Daily  . insulin aspart  0-5 Units Subcutaneous QHS  . insulin aspart  0-9 Units Subcutaneous TID WC  . lactulose  10 g Oral Daily  . lisinopril  20 mg Oral Daily  . LORazepam  0-4 mg Intravenous Q4H  . pantoprazole  40 mg Oral Daily  . sodium chloride  10-40 mL Intracatheter Q12H  . sodium chloride  3 mL Intravenous Q12H  . sucralfate  1 g Oral TID WC & HS  . thiamine  100 mg Oral Daily   Continuous Infusions:   Active Problems:   ETOH abuse   Diabetes mellitus   Anemia   PUD (peptic ulcer disease)   DKA (diabetic ketoacidoses)   Pancreatitis   Metabolic acidosis   Humerus fracture   Rhabdomyolysis   Pancreatitis, acute   Acute encephalopathy   UTI (urinary tract infection)   Aspiration pneumonia   Essential hypertension, benign  Time spent: 25 Hosie Poisson, MD [facility] Pager [phone] If 7 PM - 7 AM, please contact night-coverage at [email or web address], password [facility] 02/09/2014, 4:51 PM  LOS: 12 days

## 2014-02-09 NOTE — Progress Notes (Signed)
Occupational Therapy Treatment Patient Details Name: Benjamin [name] MRN: [id number] DOB: 1951-04-26 Today's Date: 02/09/2014 Time: 3149-7026 OT Time Calculation (min): 18 min  OT Assessment / Plan / Recommendation  History of present illness 63 yo male s/p L ORIF humerus.    OT comments  Pt needs reinforcement with shoulder education. Sling was not supporting arm, pillow not positioned under him.  Reviewed ADL modifications, sling application and positioning so that he can instruct caregivers. Pt had pain when I first checked on him.  (Had RN premedicate him).  He states that it takes 1 hr + for pain medicine to work.  Follow Up Recommendations  SNF    Barriers to Discharge       Equipment Recommendations  None recommended by OT    Recommendations for Other Services    Frequency Min 3X/week   Progress towards OT Goals Progress towards OT goals: Progressing toward goals  Plan      Precautions / Restrictions Precautions Precautions: Fall;Shoulder Type of Shoulder Precautions: NWB L UE. Pendulums allowed per ortho.  Shoulder Interventions: Shoulder sling/immobilizer Restrictions Weight Bearing Restrictions: Yes LUE Weight Bearing: Non weight bearing   Pertinent Vitals/Pain L shoulder, 6/10.  Premedicated.  repositioned    ADL  Transfers/Ambulation Related to ADLs: Pt assisted to eob with min A and sit to stand with mod A for pendulums ADL Comments: Pt has been using bedpan:  rolled to get off and total A given for hygiene.  Sling was not supporting LUE.  When I reapplied it, I reviewed how it goes on and is adjusted so that pt can instruct caregivers.  Washed under arm and reeducated on UB/bathing & dressing technique   OT Diagnosis:    OT Problem List:   OT Treatment Interventions:     OT Goals(current goals can now be found in the care plan section)    Visit Information  Last OT Received On: 02/09/14 Assistance Needed: +2 (sit to stand +1) History of Present Illness: 63 yo  male s/p L ORIF humerus.     Subjective Data      Prior Functioning       Cognition  Cognition Arousal/Alertness: Awake/alert Behavior During Therapy: WFL for tasks assessed/performed Overall Cognitive Status: Within Functional Limits for tasks assessed General Comments: needs reinforcement with shoulder education    Mobility  Bed Mobility Bed Mobility: Supine to Sit Supine to sit: HOB elevated Sit to supine: Min assist General bed mobility comments: cues for sequence Transfers Sit to Stand: Mod assist;From elevated surface General transfer comment: semistood for pendulums using hemiwalker.  Cues for technique and assistance to rise and steady.  Pt unsteady in standing:  A x 2 needed for any further mobility for safety    Exercises  Shoulder Exercises Pendulum Exercise: Standing (performed in 3 directions) Elbow Flexion: AAROM;5 reps;Left Donning/doffing sling/immobilizer: Maximal assistance (unable to direct caregiver) Pendulum exercises (written home exercise program): Maximal assistance ROM for elbow, wrist and digits of operated UE: Minimal assistance   Balance    End of Session OT - End of Session Activity Tolerance: Patient tolerated treatment well Patient left: in bed;with call bell/phone within reach  [place] 02/09/2014, 12:27 PM Lesle Chris, OTR/L [phone] 02/09/2014

## 2014-02-10 LAB — GLUCOSE, CAPILLARY
GLUCOSE-CAPILLARY: 197 mg/dL — AB (ref 70–99)
GLUCOSE-CAPILLARY: 172 mg/dL — AB (ref 70–99)
GLUCOSE-CAPILLARY: 222 mg/dL — AB (ref 70–99)
Glucose-Capillary: 158 mg/dL — ABNORMAL HIGH (ref 70–99)

## 2014-02-10 NOTE — Progress Notes (Signed)
Occupational Therapy Treatment Patient Details Name: Benjamin [name] MRN: [id number] DOB: Dec 18, 1950 Today's Date: 02/10/2014 Time: 9211-9417 OT Time Calculation (min): 35 min  OT Assessment / Plan / Recommendation  History of present illness 63 yo male s/p L ORIF humerus.    OT comments  Pt tolerating OT well but limited shoulder movement with pendulums due to not being able to flex forward.  Pt able to state most of shoulder education.  Still needs cues not to use LUE during bed mobility  Follow Up Recommendations  SNF    Barriers to Discharge       Equipment Recommendations  None recommended by OT    Recommendations for Other Services    Frequency Min 3X/week   Progress towards OT Goals Progress towards OT goals: Progressing toward goals  Plan      Precautions / Restrictions Precautions Precautions: Fall;Shoulder Type of Shoulder Precautions: NWB L UE. Pendulums allowed per ortho.  Shoulder Interventions: Shoulder sling/immobilizer Precaution Comments: needs cues not to push/pull with LUE Restrictions Weight Bearing Restrictions: Yes LUE Weight Bearing: Non weight bearing   Pertinent Vitals/Pain 7/10. repositioned    ADL  Upper Body Dressing: Moderate assistance Where Assessed - Upper Body Dressing: Unsupported sitting Equipment Used:  (hemiwalker) Transfers/Ambulation Related to ADLs: Pt used bedrail and HOB raised to get to eob with min A.  Cues not to use LUE to assist with bed mobility.  Stood with min A from high bed. ADL Comments: Reviewed shoulder education and pt was able to describe seesawing to wash under arm, donning sling and position, and using powder instead of deodorant.  cues for shirt sequence (but he has only been wearing gowns in acute).  Pt's cathether had come off--assisted with new gown.  Performed distal ROM and pendulum exercises.  Pt is not able to bend forward more than 30 degrees--states he has scoliosis and he is not getting much passive movement from  pendulums.      OT Diagnosis:    OT Problem List:   OT Treatment Interventions:     OT Goals(current goals can now be found in the care plan section)    Visit Information  Last OT Received On: 02/10/14 Assistance Needed: +2 (+1 sit to stand) History of Present Illness: 63 yo male s/p L ORIF humerus.     Subjective Data      Prior Functioning       Cognition  Cognition Arousal/Alertness: Awake/alert Behavior During Therapy: WFL for tasks assessed/performed Overall Cognitive Status: Within Functional Limits for tasks assessed    Mobility  Bed Mobility Sit to supine: Min assist General bed mobility comments: cues not to use LUE Transfers Sit to Stand: Min assist;From elevated surface General transfer comment: stood completely before bending forward for pendulums.  Position not optimal for pendulums    Exercises  Shoulder Exercises Pendulum Exercise: Standing Elbow Flexion: AROM (3 directions) Wrist Flexion: AROM Wrist Extension: AROM Donning/doffing sling/immobilizer: Maximal assistance Correct positioning of sling/immobilizer: Patient able to independently direct caregiver Pendulum exercises (written home exercise program): Minimal assistance (limited movement) ROM for elbow, wrist and digits of operated UE: Supervision/safety Sling wearing schedule (on at all times/off for ADL's): Patient able to independently direct caregiver   Balance    End of Session OT - End of Session Activity Tolerance: Patient tolerated treatment well Patient left: in bed;with call bell/phone within reach  [place] 02/10/2014, 11:32 AM Lesle Chris, OTR/L [phone] 02/10/2014

## 2014-02-10 NOTE — Progress Notes (Signed)
   Subjective: 5 Days Post-Op Procedure(s) (LRB): OPEN REDUCTION INTERNAL FIXATION (ORIF) LEFT  PROXIMAL HUMERUS FRACTURE (Left)  Mild soreness in left shoulder but overall very tolerable Continues to wear sling Patient reports pain as mild.  Objective:   VITALS:   Filed Vitals:   02/10/14 0608  BP: 151/83  Pulse: 71  Temp: 98.2 F (36.8 C)  Resp: 20    Left shoulder incision healing well nv intact distally Dressing change per nursing  LABS  Recent Labs  02/07/14 1945 02/08/14 0400  HGB 11.0* 10.4*  HCT 32.2* 30.1*  WBC 7.1 4.9  PLT 335 332    No results found for this basename: NA, K, BUN, CREATININE, GLUCOSE,  in the last 72 hours   Assessment/Plan: 5 Days Post-Op Procedure(s) (LRB): OPEN REDUCTION INTERNAL FIXATION (ORIF) LEFT  PROXIMAL HUMERUS FRACTURE (Left)  No lifting pushing or pulling with left upper extremity F/u with Dr. Onnie Graham in 2 weeks   Merla Riches, MPAS, PA-C  02/10/2014, 8:35 AM

## 2014-02-10 NOTE — Progress Notes (Signed)
PROGRESS NOTE  Benjamin [name] BJS:[id number] DOB: [birth year] DOA: 01/28/2014 PCP: [name], MD  HPI / Interval  history of alcohol abuse and peptic ulcer disease. Was last admitted to July with her upper GI bleed. Patient resides at home by himself. He is unable to provide his own history. Per ER notes and EMS patient was found down on the floor covered in his own feces. Patient was noted to have large amount of bruising to his left shoulder. He was brought then to emergency department where he was found to have surgical neck fracture left proximal humerus. Patient was noted to be hypothermic requiring. After receiving IV fluids is dramatically proved. Initially he was found to be hyperglycemic and was started on glucose stabilizer with good results. He is blood work was significant for metabolic acidosis and increased lactic acid. Patient is currently improving. Lipase initially was noted to be elevated to 200 range. Patient states that he drinks 4-5 glasses of wine a day was drink was on Friday which is 4 days ago patient history somewhat unreliable. Initially CCM was called to admission but defer to hospitalist.  Patient initially admitted to SDU with acute encephalopathy, rhabdomyolysis, lactic acidosis, DKA. Initially febrile due to likely lung source, s/p a 7 day course with Ceftriaxone finished 02/05/14. On admission he was found to have a displaced left proximal humerus fracture, which, after medical stabilization has been surgically addressed per orthopedic surgery.   Assessment/Plan:  Left proximal humerus fracture - ortho consulted, appreciate input.  - s/p OR 02/05/2014. Pain control and PT eval.  ETOH Abuse - On CIWA, withdrawing resolved. Patient seems determined to quit drinking. Left infrahilar density on CXR - CT without evidence of a mass, ?aspiration pneumonia. CT scan  is more convincing for pneumonia,. Completed 7 days of rocephin.  Acute encephalopathy - ETOH, elevated ammonia.  Improving.  - Continue lactulose, decreasing to once a day his mental status has improved significantly Hypertension - blood pressure trends upwards, started lisinopril 10 mg on 2/28, increased to 20 mg 3/3, added norvasc.  DKA - off insulin gtt, continue s.q. Metabolic acidosis - improving UTI - patient febrile 2/24-2/25 night, UA positive. Ceftriaxone. Urine cultures negative DM - continue NPH and SSI PUD - continue PPI Rhabdo - CK improving.  Anemia - no apparent active bleeding, monitor. Hemoglobin slowly trending down to 7.3 on 2/27. Status post two unit transfusion. Hemoglobin improved and has remained stable. Pancreatitis - clinically improving, carb diet  Diet: carb modified Fluids: NS DVT Prophylaxis: SCD  Code Status: Full Family Communication: None this morning Disposition Plan: inpatient, SNF when medically ready,  Consultants:  Orthopedic surgery  Procedures:  ORIF left proximal humerus fracture, Dr. Onnie Graham 02/05/14.   Antibiotics - Ceftriaxone 2/25>>3/3  HPI/Subjective: - Denies any chest pain, breathing difficulties this morning. Upbeat after surgery, . REPORT pain in the shoulder.   Objective: Filed Vitals:   02/10/14 0608 02/10/14 0920 02/10/14 1341 02/10/14 1609  BP: 151/83 168/76 139/70 142/75  Pulse: 71  74   Temp: 98.2 F (36.8 C)  98 F (36.7 C)   TempSrc: Oral  Oral   Resp: 20  18   Height:      Weight:      SpO2: 100%  100%     Intake/Output Summary (Last 24 hours) at 02/10/14 1744 Last data filed at 02/10/14 1634  Gross per 24 hour  Intake    960 ml  Output   1875 ml  Net   -915 ml  Filed Weights   01/29/14 0930  Weight: 67.2 kg (148 lb 2.4 oz)   Exam:  General:  NAD  Cardiovascular: regular rate and rhythm, without MRG  Respiratory: good air movement, clear to auscultation throughout, no wheezing, ronchi or rales  Abdomen: soft, not tender to palpation, positive bowel sounds  MSK: no peripheral edema Neuro: non  focal  Data Reviewed: Basic Metabolic Panel:  Recent Labs Lab 02/04/14 1425 02/05/14 0450 02/06/14 0615  NA 132* 130* 130*  K 3.8 3.8 3.5*  CL 99 98 96  CO2 23 24 23   GLUCOSE 202* 98 171*  BUN 9 8 7   CREATININE 0.47* 0.56 0.50  CALCIUM 7.8* 8.0* 7.9*  MG  --   --  1.5  PHOS  --   --  3.0    Liver Function Tests:  Recent Labs Lab 02/06/14 0615  AST 22  ALT 30  ALKPHOS 235*  BILITOT 0.3  PROT 4.7*  ALBUMIN 1.9*   No results found for this basename: LIPASE, AMYLASE,  in the last 168 hours No results found for this basename: AMMONIA,  in the last 168 hours CBC:  Recent Labs Lab 02/04/14 1425 02/05/14 0450 02/06/14 0615 02/07/14 1945 02/08/14 0400  WBC 4.2 4.4 6.4 7.1 4.9  NEUTROABS  --   --   --  5.4  --   HGB 9.5* 9.8* 8.8* 11.0* 10.4*  HCT 27.3* 28.5* 26.1* 32.2* 30.1*  MCV 92.5 92.5 93.2 90.2 90.4  PLT 302 329 352 335 332   Cardiac Enzymes: No results found for this basename: CKTOTAL, CKMB, CKMBINDEX, TROPONINI,  in the last 168 hours CBG:  Recent Labs Lab 02/09/14 1718 02/09/14 2120 02/10/14 0716 02/10/14 1209 02/10/14 1636  GLUCAP 228* 214* 158* 197* 172*    Recent Results (from the past 240 hour(s))  URINE CULTURE     Status: None   Collection Time    02/01/14  7:01 AM      Result Value Ref Range Status   Specimen Description URINE, CLEAN CATCH   Final   Special Requests NONE   Final   Culture  Setup Time     Final   Value: 02/01/2014 11:24     Performed at [place]     Final   Value: NO GROWTH     Performed at [facility]   Culture     Final   Value: NO GROWTH     Performed at [facility]   Report Status 02/02/2014 FINAL   Final  SURGICAL PCR SCREEN     Status: None   Collection Time    02/05/14  1:21 AM      Result Value Ref Range Status   MRSA, PCR NEGATIVE  NEGATIVE Final   Staphylococcus aureus NEGATIVE  NEGATIVE Final   Comment:            The Xpert SA Assay (FDA      approved for NASAL specimens     in patients over 41 years of age),     is one component of     a comprehensive surveillance     program.  Test performance has     been validated by [employer] for patients greater     than or equal to 85 year old.     It is not intended     to diagnose infection nor to     guide or monitor  treatment.   Studies: No results found. Scheduled Meds: . amLODipine  10 mg Oral Daily  . cloNIDine  0.2 mg Oral TID  . docusate sodium  100 mg Oral BID  . feeding supplement (GLUCERNA SHAKE)  237 mL Oral BID BM  . ferrous sulfate  325 mg Oral TID WC  . folic acid  1 mg Oral Daily  . insulin aspart  0-5 Units Subcutaneous QHS  . insulin aspart  0-9 Units Subcutaneous TID WC  . lactulose  10 g Oral Daily  . lisinopril  20 mg Oral Daily  . LORazepam  0-4 mg Intravenous Q4H  . pantoprazole  40 mg Oral Daily  . sodium chloride  10-40 mL Intracatheter Q12H  . sodium chloride  3 mL Intravenous Q12H  . sucralfate  1 g Oral TID WC & HS  . thiamine  100 mg Oral Daily   Continuous Infusions:   Active Problems:   ETOH abuse   Diabetes mellitus   Anemia   PUD (peptic ulcer disease)   DKA (diabetic ketoacidoses)   Pancreatitis   Metabolic acidosis   Humerus fracture   Rhabdomyolysis   Pancreatitis, acute   Acute encephalopathy   UTI (urinary tract infection)   Aspiration pneumonia   Essential hypertension, benign  Time spent: 25 Hosie Poisson, MD [facility] Pager [phone] If 7 PM - 7 AM, please contact night-coverage at [email or web address], password [facility] 02/10/2014, 5:44 PM  LOS: 13 days

## 2014-02-11 LAB — GLUCOSE, CAPILLARY
GLUCOSE-CAPILLARY: 160 mg/dL — AB (ref 70–99)
GLUCOSE-CAPILLARY: 220 mg/dL — AB (ref 70–99)

## 2014-02-11 MED ORDER — DSS 100 MG PO CAPS: 100.0000 mg | ORAL_CAPSULE | Freq: Two times a day (BID) | ORAL | Status: DC

## 2014-02-11 MED ORDER — LISINOPRIL 20 MG PO TABS: 20.0000 mg | ORAL_TABLET | Freq: Every day | ORAL | Status: DC

## 2014-02-11 MED ORDER — POLYETHYLENE GLYCOL 3350 17 G PO PACK: 17.0000 g | PACK | Freq: Every day | ORAL | Status: DC | PRN

## 2014-02-11 MED ORDER — FERROUS SULFATE 325 (65 FE) MG PO TABS: 325.0000 mg | ORAL_TABLET | Freq: Three times a day (TID) | ORAL | Status: DC

## 2014-02-11 MED ORDER — INSULIN ASPART 100 UNIT/ML ~~LOC~~ SOLN: 0.0000 [IU] | Freq: Three times a day (TID) | SUBCUTANEOUS | Status: DC

## 2014-02-11 MED ORDER — PANTOPRAZOLE SODIUM 40 MG PO TBEC: 40.0000 mg | DELAYED_RELEASE_TABLET | Freq: Every day | ORAL | Status: DC

## 2014-02-11 MED ORDER — GLUCERNA SHAKE PO LIQD: 237.0000 mL | Freq: Two times a day (BID) | ORAL | Status: DC

## 2014-02-11 MED ORDER — AMLODIPINE BESYLATE 10 MG PO TABS: 10.0000 mg | ORAL_TABLET | Freq: Every day | ORAL | Status: DC

## 2014-02-11 NOTE — Progress Notes (Signed)
Physical Therapy Treatment Patient Details Name: Jaquise [name] MRN: [id number] DOB: 1951/03/16 Today's Date: 02/11/2014 Time: 2751-7001 PT Time Calculation (min): 11 min  PT Assessment / Plan / Recommendation  History of Present Illness 63 yo male s/p L ORIF humerus.    PT Comments   *Good progress with gait distance today. Still requires assist and cueing for technique with sit to stand.  **  Follow Up Recommendations  SNF     Does the patient have the potential to tolerate intense rehabilitation     Barriers to Discharge        Equipment Recommendations  None recommended by PT    Recommendations for Other Services OT consult  Frequency Min 3X/week   Progress towards PT Goals Progress towards PT goals: Progressing toward goals  Plan Current plan remains appropriate    Precautions / Restrictions Precautions Precautions: Fall;Shoulder Type of Shoulder Precautions: NWB L UE. Pendulums allowed per ortho.  Shoulder Interventions: Shoulder sling/immobilizer Precaution Comments: needs cues not to push/pull with LUE Restrictions Weight Bearing Restrictions: Yes LUE Weight Bearing: Non weight bearing Other Position/Activity Restrictions: AROM L elbow, wrist and fingers   Pertinent Vitals/Pain **6/10 L shoulder premedicated*    Mobility  Bed Mobility Overal bed mobility: Modified Independent Bed Mobility: Sit to Supine Supine to sit: HOB elevated Sit to supine: Modified independent (Device/Increase time) General bed mobility comments: used rail, HOB elevated 25* Transfers Overall transfer level: Needs assistance Equipment used: Hemi-walker Transfers: Sit to/from Stand Sit to Stand: Mod assist;From elevated surface General transfer comment: Assist to rise, VCs for set up technique Ambulation/Gait Ambulation/Gait assistance: Min assist Ambulation Distance (Feet): 45 Feet Assistive device: Hemi-walker Gait Pattern/deviations: Step-through pattern;Decreased step length -  right;Decreased step length - left Gait velocity interpretation: Below normal speed for age/gender General Gait Details: Assist to stabilize and maneuver with walker.  VCs safety, posture, distance from walker. Increased gait distance today.     Exercises Shoulder Exercises Pendulum Exercise: Standing Elbow Flexion: AROM (3 directions) Wrist Flexion: AROM Wrist Extension: AROM   PT Diagnosis:    PT Problem List:   PT Treatment Interventions:     PT Goals (current goals can now be found in the care plan section) Acute Rehab PT Goals Patient Stated Goal: to go to rehab Time For Goal Achievement: 02/15/14 Potential to Achieve Goals: Good  Visit Information  Last PT Received On: 02/11/14 Assistance Needed: +1 History of Present Illness: 63 yo male s/p L ORIF humerus.     Subjective Data  Patient Stated Goal: to go to rehab   Cognition  Cognition Arousal/Alertness: Awake/alert Behavior During Therapy: WFL for tasks assessed/performed Overall Cognitive Status: Within Functional Limits for tasks assessed General Comments: needs reinforcement with shoulder education    Balance     End of Session PT - End of Session Equipment Utilized During Treatment: Gait belt;Other (comment) (LUE sling) Activity Tolerance: Patient tolerated treatment well Patient left: with call bell/phone within reach;in bed Nurse Communication: Mobility status   GP     Blondell Reveal Kistler 02/11/2014, 11:56 AM [phone]

## 2014-02-11 NOTE — Progress Notes (Addendum)
CSW called patient's brother to give him new choices now that patient has coventry. He is agreeable to [facility].  Tylisa Alcivar C. Raw[place]ins MSW, [place] CSW spoke with Mendel Ryder at [facility] # [phone]. [facility] able to accept today. Notified MD.  Theresia Bough. [place] MSW, [place] Packet copied and placed in [place]. Patient aware of change and is agreeable to discharge to [facility] today. CSW called patient's brother, he thanked CSW for all CSW assistance. ptar called for transportation.  Nomi Rudnicki C. [place] MSW, [place]

## 2014-02-11 NOTE — Progress Notes (Signed)
Report called to Sharyn Lull, Therapist, sports at [facility]. PICC line removed by IV team. Pt stable and ready for transport. [facility]AR called and transportation scheduled.  Benjamin [name] [facility] 02/11/2014 1:50 PM

## 2014-02-11 NOTE — Clinical Social Work Placement (Signed)
     Clinical Social Work Department CLINICAL SOCIAL WORK PLACEMENT NOTE 02/11/2014  Patient:  Benjamin [name], Benjamin [name]  Account Number:  [id number] Admit date:  01/28/2014  Clinical Social Worker:  Caren Hazy, LCSW  Date/time:  02/11/2014 12:00 M  Clinical Social Work is seeking post-discharge placement for this patient at the following level of care:   [place]   (*CSW will update this form in Epic as items are completed)   02/11/2014  Patient/family provided with [place] Department of Clinical Social Works list of facilities offering this level of care within the geographic area requested by the patient (or if unable, by the patients family).  02/11/2014  Patient/family informed of their freedom to choose among providers that offer the needed level of care, that participate in Medicare, Medicaid or managed care program needed by the patient, have an available bed and are willing to accept the patient.  02/11/2014  Patient/family informed of [facility] ownership interest in [facility], as well as of the fact that they are under no obligation to receive care at this facility.  PASARR submitted to EDS on 02/11/2014 PASARR number received from [employer] on 02/11/2014  FL2 transmitted to all facilities in geographic area requested by pt/family on  02/11/2014 FL2 transmitted to all facilities within larger geographic area on   Patient informed that his/her managed care company has contracts with or will negotiate with  certain facilities, including the following:     Patient/family informed of bed offers received:  02/11/2014 Patient chooses bed at [facility] Physician recommends and patient chooses bed at    Patient to be transferred to [facility] on  02/11/2014 Patient to be transferred to facility by ptar  The following physician request were entered in Epic:   Additional Comments:

## 2014-02-11 NOTE — Progress Notes (Signed)
Occupational Therapy Treatment Patient Details Name: Benjamin [name] MRN: [id number] DOB: [birth year] Today's Date: 02/11/2014 Time: 9381-8299 OT Time Calculation (min): 45 min  OT Assessment / Plan / Recommendation  History of present illness 63 yo male s/p L ORIF humerus.    OT comments  Pt certainly making progress!  Follow Up Recommendations  SNF       Equipment Recommendations  None recommended by OT       Frequency Min 2X/week      Plan Discharge plan remains appropriate    Precautions / Restrictions Precautions Precautions: Fall;Shoulder Type of Shoulder Precautions: NWB L UE. Pendulums allowed per ortho.  Shoulder Interventions: Shoulder sling/immobilizer Precaution Comments: needs cues not to push/pull with LUE Restrictions Weight Bearing Restrictions: Yes LUE Weight Bearing: Non weight bearing       ADL  ADL Comments: Reviewed shoulder education and pt was able to describe seesawing to wash under arm, donning sling and position, and using powder instead of deodorant.  cues for shirt sequence (but he has only been wearing gowns in acute).    Performed distal ROM and pendulum exercises.  Pt is not able to bend forward more than 30 degrees--states he has scoliosis and he is not getting much passive movemen      OT Goals(current goals can now be found in the care plan section) Acute Rehab OT Goals Patient Stated Goal: go re rehab OT Goal Formulation: With patient  Visit Information  Last OT Received On: 02/11/14 History of Present Illness: 63 yo male s/p L ORIF humerus.     Subjective Data   Pt asking about SNF he is going too.  Pt seems a tad nervous      Cognition  Cognition Arousal/Alertness: Awake/alert Behavior During Therapy: WFL for tasks assessed/performed Overall Cognitive Status: Within Functional Limits for tasks assessed General Comments: needs reinforcement with shoulder education    Mobility  Bed Mobility Bed Mobility: Supine to Sit Supine to  sit: HOB elevated Sit to supine: Min assist General bed mobility comments: cues for sequence Transfers Sit to Stand: Mod assist;From elevated surface General transfer comment: semistood for pendulums using hemiwalker.  Cues for technique and assistance to rise and steady.  Pt unsteady in standing:  A x 2 needed for any further mobility for safety    Exercises  Shoulder Exercises Pendulum Exercise: Standing Elbow Flexion: AROM (3 directions) Wrist Flexion: AROM Wrist Extension: AROM      End of Session OT - End of Session Activity Tolerance: Patient tolerated treatment well Patient left: in chair;with call bell/phone within reach Nurse Communication: Mobility status;Precautions;Weight bearing status  GO     [name] 02/11/2014, 10:34 AM

## 2014-02-11 NOTE — Discharge Summary (Signed)
Physician Discharge Summary  Benjamin [name] WJX:[id number] DOB: [birth year] DOA: 01/28/2014  PCP: [name], MD  Admit date: 01/28/2014 Discharge date: 02/11/2014  Time spent: 30 minutes  Recommendations for Outpatient Follow-up:  1. Follow up with PCP in one week 2. Follow up with ortho as recommended.   Discharge Diagnoses:  Active Problems:   ETOH abuse   Diabetes mellitus   Anemia   PUD (peptic ulcer disease)   DKA (diabetic ketoacidoses)   Pancreatitis   Metabolic acidosis   Humerus fracture   Rhabdomyolysis   Pancreatitis, acute   Acute encephalopathy   UTI (urinary tract infection)   Aspiration pneumonia   Essential hypertension, benign   Discharge Condition: improved.   Diet recommendation: low sodium diet, carb modified diet.   Filed Weights   01/29/14 0930  Weight: 67.2 kg (148 lb 2.4 oz)    History of present illness:  history of alcohol abuse and peptic ulcer disease. Was last admitted to July with her upper GI bleed. Patient resides at home by himself. He is unable to provide his own history. Per ER notes and EMS patient was found down on the floor covered in his own feces. Patient was noted to have large amount of bruising to his left shoulder. He was brought then to emergency department where he was found to have surgical neck fracture left proximal humerus. Patient was noted to be hypothermic requiring. After receiving IV fluids is dramatically proved. Initially he was found to be hyperglycemic and was started on glucose stabilizer with good results. He is blood work was significant for metabolic acidosis and increased lactic acid. Patient is currently improving. Lipase initially was noted to be elevated to 200 range. Patient states that he drinks 4-5 glasses of wine a day was drink was on Friday which is 4 days ago patient history somewhat unreliable. Initially CCM was called to admission but defer to hospitalist.  Patient initially admitted to SDU with acute  encephalopathy, rhabdomyolysis, lactic acidosis, DKA. Initially febrile due to likely lung source, s/p a 7 day course with Ceftriaxone finished 02/05/14. On admission he was found to have a displaced left proximal humerus fracture, which, after medical stabilization has been surgically addressed per orthopedic surgery.   Hospital Course:  Left proximal humerus fracture - ortho consulted, appreciate input.  - s/p OR 02/05/2014. Pain control and PT eval recommended SNF.  ETOH Abuse - On CIWA, withdrawing resolved. Patient seems determined to quit drinking.  Left infrahilar density on CXR - CT without evidence of a mass, ?aspiration pneumonia. CT scan is more convincing for pneumonia,. Completed 7 days of rocephin.  Acute encephalopathy - ETOH, elevated ammonia. Improved. -Lactulose to once a day.  Hypertension - blood pressure trends upwards, started lisinopril 10 mg on 2/28, increased to 20 mg 3/3, added norvasc.  DKA - off insulin gtt, continue NPH.  Metabolic acidosis - improving  UTI - patient febrile 2/24-2/25 night, UA positive. Ceftriaxone. Urine cultures negative  DM - continue NPH and SSI  PUD - continue PPI  Rhabdo - resolved.  Anemia/ anemia of blood loss - no apparent active bleeding, monitor. Hemoglobin slowly trending down to 7.3 on 2/27. Status post two unit transfusion. Hemoglobin improved and has remained stable.  Pancreatitis - clinically resolved, carb diet   Procedures: ORIF left proximal humerus fracture, Dr. Onnie Graham 02/05/14.   Consultations:  Orthopedics   Discharge Exam: Filed Vitals:   02/11/14 0535  BP: 166/75  Pulse: 72  Temp: 97.8 F (36.6 C)  Resp: 20    General: NAD  Cardiovascular: regular rate and rhythm, without MRG  Respiratory: good air movement, clear to auscultation throughout, no wheezing, ronchi or rales  Abdomen: soft, not tender to palpation, positive bowel sounds  MSK: no peripheral edema, has arm sling Neuro: non focal   Discharge  Instructions  Discharge Orders   Future Orders Complete By Expires   Diet - low sodium heart healthy  As directed    Discharge instructions  As directed    Comments:     Follow up with PCP in one week.  Follow up with Orthopedics as recommended.       Medication List         amLODipine 10 MG tablet  Commonly known as:  NORVASC  Take 1 tablet (10 mg total) by mouth daily.     cloNIDine 0.1 MG tablet  Commonly known as:  CATAPRES  Take 0.1 mg by mouth 3 (three) times daily.     DSS 100 MG Caps  Take 100 mg by mouth 2 (two) times daily.     feeding supplement (GLUCERNA SHAKE) Liqd  Take 237 mLs by mouth 2 (two) times daily between meals.     ferrous sulfate 325 (65 FE) MG tablet  Take 1 tablet (325 mg total) by mouth 3 (three) times daily with meals.     folic acid 1 MG tablet  Commonly known as:  FOLVITE  Take 1 mg by mouth daily.     furosemide 20 MG tablet  Commonly known as:  LASIX  Take 20 mg by mouth 2 (two) times daily.     insulin aspart 100 UNIT/ML injection  Commonly known as:  novoLOG  Inject 0-9 Units into the skin 3 (three) times daily with meals.     insulin NPH Human 100 UNIT/ML injection  Commonly known as:  HUMULIN N,NOVOLIN N  Inject 6 Units into the skin 2 (two) times daily.     lisinopril 20 MG tablet  Commonly known as:  PRINIVIL,ZESTRIL  Take 1 tablet (20 mg total) by mouth daily.     multivitamin with minerals Tabs tablet  Take 1 tablet by mouth daily.     oxyCODONE-acetaminophen 5-325 MG per tablet  Commonly known as:  PERCOCET  Take 1-2 tablets by mouth every 4 (four) hours as needed.     pantoprazole 40 MG tablet  Commonly known as:  PROTONIX  Take 1 tablet (40 mg total) by mouth daily.     polyethylene glycol packet  Commonly known as:  MIRALAX / GLYCOLAX  Take 17 g by mouth daily as needed for mild constipation.     potassium chloride 10 MEQ CR capsule  Commonly known as:  MICRO-K  Take 10-20 mEq by mouth 2 (two) times  daily as needed.     thiamine 100 MG tablet  Take 1 tablet (100 mg total) by mouth daily.     zolpidem 5 MG tablet  Commonly known as:  AMBIEN  Take 5 mg by mouth at bedtime as needed for sleep.       Allergies  Allergen Reactions  . Tetanus Toxoids Rash       Follow-up Information   Follow up with Marin Shutter, MD. (call to be seen in 10-14 days)    Specialty:  Orthopedic Surgery   Contact information:   [address] [place] [address] [place] [address] [phone]       Follow up with FULP, CAMMIE, MD. Schedule an appointment  as soon as possible for a visit in 1 week.   Specialty:  Family Medicine   Contact information:   [address] [place] [address] [phone]        The results of significant diagnostics from this hospitalization (including imaging, microbiology, ancillary and laboratory) are listed below for reference.    Significant Diagnostic Studies: Ct Head Wo Contrast  01/29/2014   CLINICAL DATA:  Found down.  EXAM: CT HEAD WITHOUT CONTRAST  TECHNIQUE: Contiguous axial images were obtained from the base of the skull through the vertex without intravenous contrast.  COMPARISON:  None available for comparison at time of study interpretation.  FINDINGS: Moderate ventriculomegaly, likely on the basis of global parenchymal brain volume loss as there is overall commensurate enlargement of the cerebral sulci and cerebellar folia, advanced for age. Patchy to confluent supratentorial and left pontine white matter hypodensities are advanced for age, without midline shift or mass effect. No intraparenchymal hemorrhage. No acute large vascular territory infarct.  Basal cisterns are patent. Moderate calcific atherosclerosis of the carotid siphons. Mild paranasal sinus mucosal thickening with trace mucoperiosteal reaction. No paranasal sinus air-fluid levels. Ocular globes and orbital contents are unremarkable. No skull fracture. Mastoid air cells are  well aerated.  IMPRESSION: No acute intracranial process.  Moderate global brain atrophy, advanced for age.  Moderate white matter changes suggesting chronic small vessel ischemic disease, advanced for age.   Electronically Signed   By: Elon Alas   On: 01/29/2014 04:06   Ct Chest Wo Contrast  01/31/2014   CLINICAL DATA:  Evaluate left lung opacity on chest radiograph  EXAM: CT CHEST WITHOUT CONTRAST  TECHNIQUE: Multidetector CT imaging of the chest was performed following the standard protocol without IV contrast.  COMPARISON:  Chest radiograph dated 01/30/2014  FINDINGS: Multifocal patchy opacities in the medial right middle lobe and lingula (series 5/ image 37), left lower lobe (series 5/ image 36), and right lower lobe (series 5/image 39). This appearance has likely progressed from the recent prior chest radiographs and suggests multifocal pneumonia, possibly on the basis of aspiration.  Associated small bilateral pleural effusions.  No pneumothorax.  Visualized thyroid is unremarkable.  The heart is normal in size. No pericardial effusion. Coronary atherosclerosis in the LAD. Atherosclerotic calcifications of the aortic arch.  No suspicious mediastinal or axillary lymphadenopathy.  Visualized upper abdomen is notable for vascular calcifications in severe hepatic steatosis. Displaced, mildly comminuted left humeral neck fracture (series 2/image 3).  IMPRESSION: Suspected multifocal pneumonia, possibly on the basis of aspiration, likely progressed from recent chest radiographs.  Associated small bilateral pleural effusions.   Electronically Signed   By: Julian Hy M.D.   On: 01/31/2014 16:31   Dg Chest Port 1 View  02/03/2014   CLINICAL DATA:  PICC line placement.  Diabetic.  EXAM: PORTABLE CHEST - 1 VIEW  COMPARISON:  01/31/2014 CT.  01/30/2014 chest x-ray.  FINDINGS: Right PICC line tip distal superior vena cava level. No gross pneumothorax.  Consolidation lung bases consistent with basilar  atelectasis/ infiltrate and small pleural effusions.  Heart size top-normal.  Mildly tortuous aorta.  Right humeral neck fracture. Separation of fracture fragments best demonstrated on recent CT with proximal humeral shaft anteriorly located with respect to the humeral head.  IMPRESSION: Right PICC line tip distal superior vena cava level.  Consolidation lung bases consistent with basilar atelectasis/ infiltrate and small pleural effusions.  Right humeral neck fracture. Separation of fracture fragments best demonstrated on recent CT  with proximal humeral shaft anteriorly located with respect to the humeral head.  This is a call report.   Electronically Signed   By: Chauncey Cruel M.D.   On: 02/03/2014 17:02   Dg Chest Port 1 View  01/30/2014   CLINICAL DATA:  Possible pneumonia  EXAM: PORTABLE CHEST - 1 VIEW  COMPARISON:  01/28/2014  FINDINGS: Cardiomediastinal silhouette is stable. No segmental infiltrate or pulmonary edema. Persistent spiculated density left infrahilar region. Again further evaluation with CT scan of the chest is recommended. Displaced fracture of left humerus again noted.  IMPRESSION: Persistent spiculated density left infrahilar region. Further evaluation with CT scan of the chest is recommended. Again noted displaced fracture of the left proximal humerus. No acute infiltrate or pulmonary edema.   Electronically Signed   By: Lahoma Crocker M.D.   On: 01/30/2014 08:29   Dg Chest Port 1 View  01/28/2014   CLINICAL DATA:  Status post fall; left shoulder pain and bruising.  EXAM: PORTABLE CHEST - 1 VIEW  COMPARISON:  Left shoulder radiographs performed earlier today at 9:35 p.m.  FINDINGS: As noted on concurrent left shoulder radiographs, there is a significantly displaced fracture involving the surgical neck of the left humerus.  No additional fractures are seen.  The lungs are well-aerated. There is question of focal spiculation at the medial left lung base. There is no evidence of pleural  effusion or pneumothorax. The cardiomediastinal silhouette is within normal limits.  IMPRESSION: 1. Significantly displaced fracture involving the surgical neck of the left humerus; no additional fracture seen. 2. Question of focal spiculation at the medial left lung base; a suspicious nodule cannot be excluded. CT of the chest would be helpful for further evaluation, when and as deemed clinically appropriate.   Electronically Signed   By: Garald Balding M.D.   On: 01/28/2014 22:13   Dg Shoulder Left  02/05/2014   CLINICAL DATA:  ORIF left shoulder.  EXAM: LEFT SHOULDER - 2+ VIEW  COMPARISON:  01/28/2014  FINDINGS: There is been internal fixation of patient's left humeral head/neck fracture with lateral fixation plate and screws bridging the fracture site. Hardware is intact as there is near anatomic alignment about the fracture site. There are mild degenerative changes of the glenohumeral joint. Recommend correlation with findings at the time of the procedure.  IMPRESSION: Fixation of patient's humeral head/neck fracture with hardware intact and near anatomic alignment about the fracture site.   Electronically Signed   By: Marin Olp M.D.   On: 02/05/2014 14:19   Dg Shoulder Left Port  01/28/2014   CLINICAL DATA:  Fall.  Shoulder pain and bruising.  EXAM: PORTABLE LEFT SHOULDER - 2+ VIEW  COMPARISON:  None.  FINDINGS: Prominently displaced fracture of the surgical neck, with the humeral shaft fragment displaced about 3.6 cm anteriorly and with rotated humeral head fragment. The rotation makes it difficult to count the number of parts, although there is a lateral fragment measuring about 2.7 cm in long axis adjacent to the humeral head fragment which could have arisen from the greater or lesser tuberosity.  IMPRESSION: 1. Considerably displaced surgical neck fracture of the left proximal humerus. Favor 2 or 3 parts.   Electronically Signed   By: Sherryl Barters M.D.   On: 01/28/2014 22:05   Dg C-arm  61-120 Min-no Report  02/05/2014   CLINICAL DATA: ORIF shoulder   C-ARM 61-120 MINUTES  Fluoroscopy was utilized by the requesting physician.  No radiographic  interpretation.  Microbiology: Recent Results (from the past 240 hour(s))  SURGICAL PCR SCREEN     Status: None   Collection Time    02/05/14  1:21 AM      Result Value Ref Range Status   MRSA, PCR NEGATIVE  NEGATIVE Final   Staphylococcus aureus NEGATIVE  NEGATIVE Final   Comment:            The Xpert SA Assay (FDA     approved for NASAL specimens     in patients over 28 years of age),     is one component of     a comprehensive surveillance     program.  Test performance has     been validated by [employer] for patients greater     than or equal to 53 year old.     It is not intended     to diagnose infection nor to     guide or monitor treatment.     Labs: Basic Metabolic Panel:  Recent Labs Lab 02/04/14 1425 02/05/14 0450 02/06/14 0615  NA 132* 130* 130*  K 3.8 3.8 3.5*  CL 99 98 96  CO2 23 24 23   GLUCOSE 202* 98 171*  BUN 9 8 7   CREATININE 0.47* 0.56 0.50  CALCIUM 7.8* 8.0* 7.9*  MG  --   --  1.5  PHOS  --   --  3.0   Liver Function Tests:  Recent Labs Lab 02/06/14 0615  AST 22  ALT 30  ALKPHOS 235*  BILITOT 0.3  PROT 4.7*  ALBUMIN 1.9*   No results found for this basename: LIPASE, AMYLASE,  in the last 168 hours No results found for this basename: AMMONIA,  in the last 168 hours CBC:  Recent Labs Lab 02/04/14 1425 02/05/14 0450 02/06/14 0615 02/07/14 1945 02/08/14 0400  WBC 4.2 4.4 6.4 7.1 4.9  NEUTROABS  --   --   --  5.4  --   HGB 9.5* 9.8* 8.8* 11.0* 10.4*  HCT 27.3* 28.5* 26.1* 32.2* 30.1*  MCV 92.5 92.5 93.2 90.2 90.4  PLT 302 329 352 335 332   Cardiac Enzymes: No results found for this basename: CKTOTAL, CKMB, CKMBINDEX, TROPONINI,  in the last 168 hours BNP: BNP (last 3 results) No results found for this basename: PROBNP,  in the last 8760 hours CBG:  Recent  Labs Lab 02/10/14 0716 02/10/14 1209 02/10/14 1636 02/10/14 2056 02/11/14 0722  GLUCAP 158* 197* 172* 222* 160*       Signed:  Karimah Winquist  [facility] 02/11/2014, 10:36 AM

## 2014-02-12 ENCOUNTER — Other Ambulatory Visit: Payer: Self-pay | Admitting: *Deleted

## 2014-02-12 MED ORDER — ZOLPIDEM TARTRATE 5 MG PO TABS: ORAL_TABLET | ORAL | Status: DC

## 2014-02-12 MED ORDER — OXYCODONE-ACETAMINOPHEN 5-325 MG PO TABS: ORAL_TABLET | ORAL | Status: DC

## 2014-02-12 NOTE — Telephone Encounter (Signed)
[facility] 

## 2014-02-15 ENCOUNTER — Non-Acute Institutional Stay (SKILLED_NURSING_FACILITY): Payer: PRIVATE HEALTH INSURANCE | Admitting: Internal Medicine

## 2014-02-15 DIAGNOSIS — IMO0002 Reserved for concepts with insufficient information to code with codable children: Secondary | ICD-10-CM

## 2014-02-15 DIAGNOSIS — E43 Unspecified severe protein-calorie malnutrition: Secondary | ICD-10-CM

## 2014-02-15 DIAGNOSIS — E1065 Type 1 diabetes mellitus with hyperglycemia: Secondary | ICD-10-CM

## 2014-02-15 DIAGNOSIS — E1049 Type 1 diabetes mellitus with other diabetic neurological complication: Secondary | ICD-10-CM

## 2014-02-15 DIAGNOSIS — E1069 Type 1 diabetes mellitus with other specified complication: Secondary | ICD-10-CM

## 2014-02-15 DIAGNOSIS — K859 Acute pancreatitis without necrosis or infection, unspecified: Secondary | ICD-10-CM

## 2014-02-15 DIAGNOSIS — E1011 Type 1 diabetes mellitus with ketoacidosis with coma: Secondary | ICD-10-CM

## 2014-02-15 MED ORDER — INSULIN GLARGINE 100 UNIT/ML SOLOSTAR PEN: PEN_INJECTOR | SUBCUTANEOUS | Status: DC

## 2014-02-15 NOTE — Progress Notes (Signed)
Patient ID: Benjamin [name], male   DOB: 01-03-1951, 63 y.o.   MRN: [id number]

## 2014-02-19 LAB — BASIC METABOLIC PANEL
BUN: 20 mg/dL (ref 4–21)
CREATININE: 0.5 mg/dL — AB (ref 0.6–1.3)
Glucose: 328 mg/dL
POTASSIUM: 5.8 mmol/L — AB (ref 3.4–5.3)
SODIUM: 131 mmol/L — AB (ref 137–147)

## 2014-02-19 LAB — HEPATIC FUNCTION PANEL
ALK PHOS: 256 U/L — AB (ref 25–125)
ALT: 22 U/L (ref 10–40)
AST: 19 U/L (ref 14–40)

## 2014-02-19 LAB — CBC AND DIFFERENTIAL
HCT: 38 % — AB (ref 41–53)
Hemoglobin: 12.7 g/dL — AB (ref 13.5–17.5)
PLATELETS: 548 10*3/uL — AB (ref 150–399)
WBC: 10.4 10^3/mL

## 2014-02-19 NOTE — Progress Notes (Signed)
Patient ID: Benjamin [name], male   DOB: 06-09-1951, 63 y.o.   MRN: [id number]                  HISTORY & PHYSICAL  DATE:  02/15/2014     FACILITY: [facility]    LEVEL OF CARE:   SNF   CHIEF COMPLAINT:  Admission to SNF, post stay at [facility], 01/28/2014 through 02/11/2014.    HISTORY OF PRESENT ILLNESS:  This is a man who lives in his own apartment in [place].  He was admitted to hospital, found down on the floor covered in his own feces.  The patient states he fell off the couch, could not get himself up.  He was found to have a fracture of the left proximal humerus.  He was also hypothermic.    Initially, he was found to be hyperglycemic with significant metabolic acidosis and increased lactic acid.  Lipase was initially noted in the 200 range.  The patient was felt to have acute encephalopathy, rhabdomyolysis, lactic acidosis, and DKA.    He was also found to have a pneumonia, treated with Rocephin.    He is here for ongoing rehabilitation.    PAST MEDICAL HISTORY/PROBLEM LIST:  Chronic alcoholism most likely.     History of chronic pancreatitis with previous pancreatic surgery.  The patient states he has been on insulin related to the chronic pancreatitis.  This effectively makes him a type 1 diabetic.    Hypertension.    History of peptic ulcer disease.    History of anemia.  He did not have active bleeding.  His hemoglobin trended down to 7.3, status post transfusion.    CURRENT MEDICATIONS:  Discharge medications include:    Norvasc 10 q.d.    Clonidine 0.1  t.i.d.    DSS 100 b.i.d.    Ferrous sulfate 325 three times a day.    Folvite 1 mg daily.    Lasix 20 b.i.d.    NovoLog sliding scale.    Lisinopril 20 q.d.    Oxycodone/acetaminophen 5/325, 1-2 tablets q.4 p.r.n.    Protonix 40 q.d.    MiraLAX 17 g q.d.    Micro-K 10 b.i.d.    Thiamine 100 q.d.    Ambien 5 q.h.s.    LABORATORY DATA:   The patient's albumin was 1.9.    Alk phos was  235.    White count within normal limits, hemoglobin 9.5 to 10.4, platelet count normal.    CT scan of the chest:  A multifocal pneumonia felt secondary to aspiration.    CT scan of the head:  Moderate atrophy, advanced for age.  Moderate white matter disease.    SOCIAL HISTORY: HOUSING:  The patient tells me he lives on his own.  He has a brother in [place].   FUNCTIONAL STATUS:  Independent, according to the patient, with ADLs and IADLs.  Does not use an ambulatory assist device.  Gives himself his own insulin, which was the combination of NPH and Regular.   TOBACCO USE:  Quit smoking last September.   ALCOHOL:  States he drinks a bottle of wine a day.    REVIEW OF SYSTEMS:   CHEST/RESPIRATORY:  No cough.  No sputum.               CARDIAC:   No chest pain.     GI:  No nausea,  vomiting or abdominal pain.  He admits to eating very little.   GU:  No dysuria.  MUSCULOSKELETAL:  Complaining of severe pain in his arm, making it difficult for him to sleep at night.      PHYSICAL EXAMINATION:   VITAL SIGNS:   PULSE:  74.   RESPIRATIONS:  18.   GENERAL APPEARANCE:  Pleasant, cooperative man in no distress.   HEENT:   MOUTH/THROAT:   Poor dentition.  No oral lesions.   CHEST/RESPIRATORY:  Clear air entry bilaterally.   CARDIOVASCULAR:  CARDIAC:   Heart sounds are normal.  There are no murmurs.   GASTROINTESTINAL:  ABDOMEN:   Surgical scars.   LIVER/SPLEEN/KIDNEYS:  No liver, no spleen.  No tenderness is noted.   MUSCULOSKELETAL:   EXTREMITIES:   RIGHT LOWER EXTREMITY:  Abrasions on his right knee which he says is carpet burn.   CIRCULATION:  ARTERIAL:  Peripheral pulses are palpable.   NEUROLOGICAL:    DEEP TENDON REFLEXES:  Reflexes are symmetric.   BALANCE/GAIT:  He is able to bring himself to a standing position.  Gait somewhat widebased and unsteady.    ASSESSMENT/PLAN:  Physiologically a type 1 diabetic.  Probably secondary to chronic pancreatis due to alcohol.     Peptic ulcer disease, with last admission in July 2014 with an upper GI bleed.  He is on Protonix 40 mg a day.    Gait ataxia.  Possible peripheral neuropathy.  This may be related to diabetes.    Acute encephalopathy on presentation.  He was ill with elevated pneumonia, diabetic ketoacidosis.  This appears to have resolved.    Left infrahilar density on chest x-ray.  CT scan showed without evidence of a mass.  He completed seven days of Rocephin.    Anemia.  Hemoglobin trended down to 7.3 on July 27th.  He was transfused and after that was stable.  He was on iron.    Hypertension.  We will monitor while he is here.    The patient appears to be stable.  I will check his hemoglobin.  I have had a personal discussion with him about his alcohol use/abuse.  He has severe protein calorie malnutrition.  I have discussed this with him, as well.    CPT CODE: 91694

## 2014-02-21 ENCOUNTER — Encounter: Payer: Self-pay | Admitting: Internal Medicine

## 2014-02-21 ENCOUNTER — Non-Acute Institutional Stay (SKILLED_NURSING_FACILITY): Payer: PRIVATE HEALTH INSURANCE | Admitting: Internal Medicine

## 2014-02-21 DIAGNOSIS — E875 Hyperkalemia: Secondary | ICD-10-CM

## 2014-02-21 DIAGNOSIS — I959 Hypotension, unspecified: Secondary | ICD-10-CM

## 2014-02-21 NOTE — Progress Notes (Signed)
Patient ID: Benjamin [name], male   DOB: [birth year], 63 y.o.   MRN: [id number]    [facility]  Chief Complaint  Patient presents with  . Acute Visit    hyperkalemia- abnormal lab values   Allergies  Allergen Reactions  . Tetanus Toxoids Rash   HPI 63 y/o male patient is here for STR after left humerus fracture. He is seen today with blood pressure running low and abnormal lab value. He was noted to have hyperkalemia on 02/18/14 and was give kayexylate 15 g x 1. Recheck of his bmp shows k of 5.6. His bp reviewed shows bp 100/60, 90/52, 102/58, 99/68. He recently had a fall at home requiring hospital admission. He denies any complaints this visit besides occassional dizziness with change in position.  Review of Systems  Constitutional: Negative for fever, chills, weight loss, malaise/fatigue and diaphoresis.  HENT: Negative for congestion, hearing loss and sore throat.   Eyes: Negative for blurred vision, double vision and discharge.  Respiratory: Negative for cough, sputum production, shortness of breath and wheezing.   Cardiovascular: Negative for chest pain, palpitations, orthopnea and leg swelling.  Gastrointestinal: Negative for heartburn, nausea, vomiting, abdominal pain Genitourinary: Negative for dysuria, urgency, frequency and flank pain.  Skin: Negative for itching and rash.  Neurological: Positive for weakness. Negative for tingling, focal weakness and headaches.   Past Medical History  Diagnosis Date  . Diabetes mellitus   . Hypertension   . Alcohol abuse   . Pancreatitis    Past Surgical History  Procedure Laterality Date  . Knee surgery    . Pancreas surgery    . Esophagogastroduodenoscopy N/A 06/19/2013    Procedure: ESOPHAGOGASTRODUODENOSCOPY (EGD);  Surgeon: Cleotis Nipper, MD;  Location: [facility] ENDOSCOPY;  Service: Endoscopy;  Laterality: N/A;  . Orif humerus fracture Left 02/05/2014    Procedure: OPEN REDUCTION INTERNAL FIXATION (ORIF) LEFT  PROXIMAL HUMERUS  FRACTURE;  Surgeon: Marin Shutter, MD;  Location: [facility] ORS;  Service: Orthopedics;  Laterality: Left;   Current Outpatient Prescriptions on File Prior to Visit  Medication Sig Dispense Refill  . amLODipine (NORVASC) 10 MG tablet Take 1 tablet (10 mg total) by mouth daily.  30 tablet  1  . cloNIDine (CATAPRES) 0.1 MG tablet Take 0.1 mg by mouth 3 (three) times daily.      Marland Kitchen docusate sodium 100 MG CAPS Take 100 mg by mouth 2 (two) times daily.  10 capsule  0  . feeding supplement, GLUCERNA SHAKE, (GLUCERNA SHAKE) LIQD Take 237 mLs by mouth 2 (two) times daily between meals.    0  . ferrous sulfate 325 (65 FE) MG tablet Take 1 tablet (325 mg total) by mouth 3 (three) times daily with meals.    3  . folic acid (FOLVITE) 1 MG tablet Take 1 mg by mouth daily.      . furosemide (LASIX) 20 MG tablet Take 20 mg by mouth 2 (two) times daily.      . insulin aspart (NOVOLOG) 100 UNIT/ML injection Inject 0-9 Units into the skin 3 (three) times daily with meals.  10 mL  11  . Insulin Glargine (LANTUS SOLOSTAR) 100 UNIT/ML Solostar Pen 5 units qhs  5 pen  PRN  . lisinopril (PRINIVIL,ZESTRIL) 20 MG tablet Take 1 tablet (20 mg total) by mouth daily.      . Multiple Vitamin (MULTIVITAMIN WITH MINERALS) TABS Take 1 tablet by mouth daily.  30 tablet  0  . oxyCODONE-acetaminophen (PERCOCET) 5-325 MG per tablet  Take one tablet by mouth every 4 hours as needed for mild pain; Take two tablets by mouth every 4 hours as needed for severe pain  360 tablet  0  . pantoprazole (PROTONIX) 40 MG tablet Take 1 tablet (40 mg total) by mouth daily.      . polyethylene glycol (MIRALAX / GLYCOLAX) packet Take 17 g by mouth daily as needed for mild constipation.  14 each  0  . potassium chloride (MICRO-K) 10 MEQ CR capsule Take 10-20 mEq by mouth 2 (two) times daily as needed.      . thiamine 100 MG tablet Take 1 tablet (100 mg total) by mouth daily.  30 tablet  3  . zolpidem (AMBIEN) 5 MG tablet Take one tablet by mouth at bedtime as  needed for sleep  30 tablet  5   No current facility-administered medications on file prior to visit.    Physical exam BP 100/60  Pulse 98  Temp(Src) 98.3 F (36.8 C)  Resp 18  Wt 144 lb (65.318 kg)  General- elderly male in no acute distress, thin built Head- atraumatic, normocephalic Eyes- PERRLA, EOMI, no pallor, no icterus Neck- no lymphadenopathy Cardiovascular- normal s1,s2, no murmurs/ rubs/ gallops Respiratory- bilateral clear to auscultation, no wheeze, no rhonchi, no crackles Abdomen- bowel sounds present, soft, non tender Musculoskeletal- able to move all 4 extremities, weakness present, widebased gait Neurological- no focal deficit Psychiatry- alert and oriented to person, place and time, normal mood and affect  Labs- 02/20/14 glu 182, na 136, k 5.6, cl 95, bun 14, cr 0.63, ca 9 02/18/14 k 5.8, cl 92, cr 0.94  Assessment/plan  1. Hypotension, unspecified Low bp reading on review. Likely iatrogenic. Will decrease his amlodipine to 5 mg daily and clonidine to 0.1 mg bid. Will add holding parameters to all bp medication. Check bp once a day and adjust med further if bp remains low.   2. Hyperkalemia He has been recieveing kcl 10 mg bid. This again is likely iatrogenic. No symptoms of hperkalemia at present. Will discontinue kcl supplement. Given kayexylate 15 g x 1 now. Check bmp 02/22/14. Pt on lasix. Thus will recheck bmp in 1 week to assess for hypokalemia as well

## 2014-02-22 LAB — BASIC METABOLIC PANEL
BUN: 16 mg/dL (ref 4–21)
CREATININE: 0.6 mg/dL (ref 0.6–1.3)
Glucose: 289 mg/dL
POTASSIUM: 4.4 mmol/L (ref 3.4–5.3)
SODIUM: 133 mmol/L — AB (ref 137–147)

## 2014-02-25 ENCOUNTER — Other Ambulatory Visit: Payer: Self-pay | Admitting: *Deleted

## 2014-02-25 MED ORDER — OXYCODONE HCL 10 MG PO TABS: ORAL_TABLET | ORAL | Status: DC

## 2014-02-25 NOTE — Telephone Encounter (Signed)
[facility] 

## 2014-02-27 ENCOUNTER — Encounter (HOSPITAL_COMMUNITY): Payer: Self-pay | Admitting: *Deleted

## 2014-02-27 MED ORDER — CEFAZOLIN SODIUM-DEXTROSE 2-3 GM-% IV SOLR
2.0000 g | INTRAVENOUS | Status: AC
  Administered 2014-02-28: 2 g via INTRAVENOUS
  Filled 2014-02-27: qty 50

## 2014-02-27 MED ORDER — LACTATED RINGERS IV SOLN
INTRAVENOUS | Status: DC
  Administered 2014-02-28: 09:00:00 via INTRAVENOUS

## 2014-02-27 MED ORDER — CHLORHEXIDINE GLUCONATE 4 % EX LIQD
60.0000 mL | Freq: Once | CUTANEOUS | Status: DC
  Filled 2014-02-27: qty 60

## 2014-02-27 NOTE — Progress Notes (Signed)
Spoke with Lucina Mellow, pt nurse,  at [facility]. Please assess pt for OSA and cardiac studies on DOS. Nurse to fax current [facility] today.

## 2014-02-28 ENCOUNTER — Encounter (HOSPITAL_COMMUNITY)
Admission: RE | Disposition: A | Discharge status: Skilled Nursing Facility | Payer: Self-pay | Source: Ambulatory Visit | Attending: Orthopedic Surgery

## 2014-02-28 ENCOUNTER — Encounter (HOSPITAL_COMMUNITY): Payer: Self-pay | Admitting: *Deleted

## 2014-02-28 ENCOUNTER — Ambulatory Visit (HOSPITAL_COMMUNITY): Payer: No Typology Code available for payment source | Admitting: Anesthesiology

## 2014-02-28 ENCOUNTER — Ambulatory Visit (HOSPITAL_COMMUNITY): Payer: No Typology Code available for payment source

## 2014-02-28 ENCOUNTER — Encounter (HOSPITAL_COMMUNITY): Payer: No Typology Code available for payment source | Admitting: Anesthesiology

## 2014-02-28 ENCOUNTER — Inpatient Hospital Stay (HOSPITAL_COMMUNITY)
Admission: RE | Admit: 2014-02-28 | Discharge: 2014-03-01 | DRG: 483 | Disposition: A | Discharge status: Skilled Nursing Facility | Payer: No Typology Code available for payment source | Source: Ambulatory Visit | Attending: Orthopedic Surgery | Admitting: Orthopedic Surgery

## 2014-02-28 DIAGNOSIS — E119 Type 2 diabetes mellitus without complications: Secondary | ICD-10-CM | POA: Diagnosis present

## 2014-02-28 DIAGNOSIS — Y831 Surgical operation with implant of artificial internal device as the cause of abnormal reaction of the patient, or of later complication, without mention of misadventure at the time of the procedure: Secondary | ICD-10-CM | POA: Diagnosis present

## 2014-02-28 DIAGNOSIS — S42213A Unspecified displaced fracture of surgical neck of unspecified humerus, initial encounter for closed fracture: Secondary | ICD-10-CM | POA: Diagnosis present

## 2014-02-28 DIAGNOSIS — I10 Essential (primary) hypertension: Secondary | ICD-10-CM | POA: Diagnosis present

## 2014-02-28 DIAGNOSIS — Z794 Long term (current) use of insulin: Secondary | ICD-10-CM

## 2014-02-28 DIAGNOSIS — F17200 Nicotine dependence, unspecified, uncomplicated: Secondary | ICD-10-CM | POA: Diagnosis present

## 2014-02-28 DIAGNOSIS — T84498A Other mechanical complication of other internal orthopedic devices, implants and grafts, initial encounter: Secondary | ICD-10-CM | POA: Diagnosis present

## 2014-02-28 DIAGNOSIS — Z79899 Other long term (current) drug therapy: Secondary | ICD-10-CM | POA: Diagnosis not present

## 2014-02-28 DIAGNOSIS — F1010 Alcohol abuse, uncomplicated: Secondary | ICD-10-CM | POA: Diagnosis present

## 2014-02-28 DIAGNOSIS — Z96619 Presence of unspecified artificial shoulder joint: Secondary | ICD-10-CM

## 2014-02-28 HISTORY — PX: HUMERAL HEMIARTHROPLASTY: SHX1765

## 2014-02-28 HISTORY — PX: SHOULDER HEMI-ARTHROPLASTY: SHX5049

## 2014-02-28 LAB — CBC WITH DIFFERENTIAL/PLATELET
BASOS PCT: 0 % (ref 0–1)
Basophils Absolute: 0 10*3/uL (ref 0.0–0.1)
Eosinophils Absolute: 0.1 10*3/uL (ref 0.0–0.7)
Eosinophils Relative: 1 % (ref 0–5)
HCT: 33.6 % — ABNORMAL LOW (ref 39.0–52.0)
HEMOGLOBIN: 11.6 g/dL — AB (ref 13.0–17.0)
LYMPHS ABS: 1.5 10*3/uL (ref 0.7–4.0)
Lymphocytes Relative: 14 % (ref 12–46)
MCH: 30.8 pg (ref 26.0–34.0)
MCHC: 34.5 g/dL (ref 30.0–36.0)
MCV: 89.1 fL (ref 78.0–100.0)
MONOS PCT: 8 % (ref 3–12)
Monocytes Absolute: 0.8 10*3/uL (ref 0.1–1.0)
NEUTROS ABS: 8.5 10*3/uL — AB (ref 1.7–7.7)
NEUTROS PCT: 78 % — AB (ref 43–77)
Platelets: 543 10*3/uL — ABNORMAL HIGH (ref 150–400)
RBC: 3.77 MIL/uL — ABNORMAL LOW (ref 4.22–5.81)
RDW: 13.2 % (ref 11.5–15.5)
WBC: 10.9 10*3/uL — AB (ref 4.0–10.5)

## 2014-02-28 LAB — COMPREHENSIVE METABOLIC PANEL
ALT: 15 U/L (ref 0–53)
AST: 19 U/L (ref 0–37)
Albumin: 1.9 g/dL — ABNORMAL LOW (ref 3.5–5.2)
Alkaline Phosphatase: 204 U/L — ABNORMAL HIGH (ref 39–117)
BILIRUBIN TOTAL: 0.4 mg/dL (ref 0.3–1.2)
BUN: 11 mg/dL (ref 6–23)
CO2: 26 mEq/L (ref 19–32)
Calcium: 8.7 mg/dL (ref 8.4–10.5)
Chloride: 92 mEq/L — ABNORMAL LOW (ref 96–112)
Creatinine, Ser: 0.57 mg/dL (ref 0.50–1.35)
GFR calc Af Amer: >90 mL/min (ref >90)
GFR calc non Af Amer: >90 mL/min (ref >90)
GLUCOSE: 85 mg/dL (ref 70–99)
POTASSIUM: 4.7 meq/L (ref 3.7–5.3)
Sodium: 130 mEq/L — ABNORMAL LOW (ref 137–147)
Total Protein: 5.7 g/dL — ABNORMAL LOW (ref 6.0–8.3)

## 2014-02-28 LAB — GLUCOSE, CAPILLARY
GLUCOSE-CAPILLARY: 123 mg/dL — AB (ref 70–99)
GLUCOSE-CAPILLARY: 154 mg/dL — AB (ref 70–99)
GLUCOSE-CAPILLARY: 185 mg/dL — AB (ref 70–99)
Glucose-Capillary: 82 mg/dL (ref 70–99)
Glucose-Capillary: 95 mg/dL (ref 70–99)

## 2014-02-28 LAB — PROTIME-INR
INR: 1 (ref 0.00–1.49)
PROTHROMBIN TIME: 13 s (ref 11.6–15.2)

## 2014-02-28 LAB — TYPE AND SCREEN
ABO/RH(D): O POS
Antibody Screen: NEGATIVE

## 2014-02-28 LAB — APTT: aPTT: 29 seconds (ref 24–37)

## 2014-02-28 SURGERY — HEMIARTHROPLASTY, SHOULDER
Anesthesia: Regional | Site: Arm Upper | Laterality: Left

## 2014-02-28 MED ORDER — VITAMIN B-1 100 MG PO TABS
100.0000 mg | ORAL_TABLET | Freq: Every day | ORAL | Status: DC
  Administered 2014-02-28 – 2014-03-01 (×2): 100 mg via ORAL
  Filled 2014-02-28 (×2): qty 1

## 2014-02-28 MED ORDER — POTASSIUM CHLORIDE CRYS ER 10 MEQ PO TBCR
10.0000 meq | EXTENDED_RELEASE_TABLET | Freq: Every day | ORAL | Status: DC
  Administered 2014-02-28 – 2014-03-01 (×2): 10 meq via ORAL
  Filled 2014-02-28 (×2): qty 1

## 2014-02-28 MED ORDER — ONDANSETRON HCL 4 MG/2ML IJ SOLN: 4.0000 mg | Freq: Four times a day (QID) | INTRAMUSCULAR | Status: DC | PRN

## 2014-02-28 MED ORDER — LIDOCAINE HCL (CARDIAC) 20 MG/ML IV SOLN
INTRAVENOUS | Status: DC | PRN
  Administered 2014-02-28: 80 mg via INTRAVENOUS

## 2014-02-28 MED ORDER — OXYCODONE-ACETAMINOPHEN 5-325 MG PO TABS
1.0000 | ORAL_TABLET | ORAL | Status: DC | PRN
  Administered 2014-02-28 – 2014-03-01 (×4): 2 via ORAL
  Filled 2014-02-28 (×4): qty 2

## 2014-02-28 MED ORDER — ONDANSETRON HCL 4 MG/2ML IJ SOLN
INTRAMUSCULAR | Status: AC
  Filled 2014-02-28: qty 2

## 2014-02-28 MED ORDER — ROCURONIUM BROMIDE 50 MG/5ML IV SOLN
INTRAVENOUS | Status: AC
  Filled 2014-02-28: qty 1

## 2014-02-28 MED ORDER — PHENOL 1.4 % MT LIQD: 1.0000 | OROMUCOSAL | Status: DC | PRN

## 2014-02-28 MED ORDER — FENTANYL CITRATE 0.05 MG/ML IJ SOLN
INTRAMUSCULAR | Status: AC
  Filled 2014-02-28: qty 2

## 2014-02-28 MED ORDER — FENTANYL CITRATE 0.05 MG/ML IJ SOLN
INTRAMUSCULAR | Status: AC
  Filled 2014-02-28: qty 5

## 2014-02-28 MED ORDER — MIDAZOLAM HCL 2 MG/2ML IJ SOLN
INTRAMUSCULAR | Status: AC
  Filled 2014-02-28: qty 2

## 2014-02-28 MED ORDER — CEFAZOLIN SODIUM 1-5 GM-% IV SOLN
1.0000 g | Freq: Four times a day (QID) | INTRAVENOUS | Status: AC
  Administered 2014-02-28 – 2014-03-01 (×3): 1 g via INTRAVENOUS
  Filled 2014-02-28 (×4): qty 50

## 2014-02-28 MED ORDER — LACTATED RINGERS IV SOLN
INTRAVENOUS | Status: DC
  Administered 2014-02-28: 09:00:00 via INTRAVENOUS

## 2014-02-28 MED ORDER — GLYCOPYRROLATE 0.2 MG/ML IJ SOLN
INTRAMUSCULAR | Status: AC
  Filled 2014-02-28: qty 2

## 2014-02-28 MED ORDER — LIDOCAINE HCL (CARDIAC) 20 MG/ML IV SOLN
INTRAVENOUS | Status: AC
  Filled 2014-02-28: qty 5

## 2014-02-28 MED ORDER — ONDANSETRON HCL 4 MG PO TABS: 4.0000 mg | ORAL_TABLET | Freq: Four times a day (QID) | ORAL | Status: DC | PRN

## 2014-02-28 MED ORDER — METOCLOPRAMIDE HCL 10 MG PO TABS: 5.0000 mg | ORAL_TABLET | Freq: Three times a day (TID) | ORAL | Status: DC | PRN

## 2014-02-28 MED ORDER — OXYCODONE HCL 5 MG PO TABS: 5.0000 mg | ORAL_TABLET | Freq: Once | ORAL | Status: DC | PRN

## 2014-02-28 MED ORDER — PANTOPRAZOLE SODIUM 40 MG PO TBEC
40.0000 mg | DELAYED_RELEASE_TABLET | Freq: Every day | ORAL | Status: DC
  Administered 2014-03-01: 40 mg via ORAL
  Filled 2014-02-28: qty 1

## 2014-02-28 MED ORDER — METOCLOPRAMIDE HCL 5 MG/ML IJ SOLN: 5.0000 mg | Freq: Three times a day (TID) | INTRAMUSCULAR | Status: DC | PRN

## 2014-02-28 MED ORDER — POLYETHYLENE GLYCOL 3350 17 G PO PACK: 17.0000 g | PACK | Freq: Every day | ORAL | Status: DC | PRN

## 2014-02-28 MED ORDER — GLYCOPYRROLATE 0.2 MG/ML IJ SOLN
INTRAMUSCULAR | Status: DC | PRN
  Administered 2014-02-28: 0.4 mg via INTRAVENOUS

## 2014-02-28 MED ORDER — GLUCERNA SHAKE PO LIQD
237.0000 mL | Freq: Two times a day (BID) | ORAL | Status: DC
  Administered 2014-03-01: 237 mL via ORAL

## 2014-02-28 MED ORDER — SODIUM CHLORIDE 0.9 % IV SOLN
10.0000 mg | INTRAVENOUS | Status: DC | PRN
  Administered 2014-02-28: 10 ug/min via INTRAVENOUS

## 2014-02-28 MED ORDER — NEOSTIGMINE METHYLSULFATE 1 MG/ML IJ SOLN
INTRAMUSCULAR | Status: DC | PRN
  Administered 2014-02-28: 3 mg via INTRAVENOUS

## 2014-02-28 MED ORDER — FENTANYL CITRATE 0.05 MG/ML IJ SOLN
INTRAMUSCULAR | Status: DC | PRN
  Administered 2014-02-28: 100 ug via INTRAVENOUS

## 2014-02-28 MED ORDER — ALUM & MAG HYDROXIDE-SIMETH 200-200-20 MG/5ML PO SUSP: 30.0000 mL | ORAL | Status: DC | PRN

## 2014-02-28 MED ORDER — ADULT MULTIVITAMIN W/MINERALS CH
1.0000 | ORAL_TABLET | Freq: Every day | ORAL | Status: DC
  Administered 2014-02-28 – 2014-03-01 (×2): 1 via ORAL
  Filled 2014-02-28 (×2): qty 1

## 2014-02-28 MED ORDER — FUROSEMIDE 20 MG PO TABS
20.0000 mg | ORAL_TABLET | Freq: Two times a day (BID) | ORAL | Status: DC
  Administered 2014-02-28 – 2014-03-01 (×2): 20 mg via ORAL
  Filled 2014-02-28 (×4): qty 1

## 2014-02-28 MED ORDER — LISINOPRIL 20 MG PO TABS
20.0000 mg | ORAL_TABLET | Freq: Every day | ORAL | Status: DC
  Administered 2014-03-01: 20 mg via ORAL
  Filled 2014-02-28 (×2): qty 1

## 2014-02-28 MED ORDER — LACTATED RINGERS IV SOLN
INTRAVENOUS | Status: DC
  Administered 2014-03-01: 03:00:00 via INTRAVENOUS

## 2014-02-28 MED ORDER — AMLODIPINE BESYLATE 10 MG PO TABS
10.0000 mg | ORAL_TABLET | Freq: Every day | ORAL | Status: DC
  Administered 2014-03-01: 10 mg via ORAL
  Filled 2014-02-28 (×2): qty 1

## 2014-02-28 MED ORDER — OXYCODONE HCL 5 MG/5ML PO SOLN: 5.0000 mg | Freq: Once | ORAL | Status: DC | PRN

## 2014-02-28 MED ORDER — HYDROMORPHONE HCL PF 1 MG/ML IJ SOLN
0.2500 mg | INTRAMUSCULAR | Status: DC | PRN
  Administered 2014-03-01 (×2): 1 mg via INTRAVENOUS
  Administered 2014-03-01: 0.5 mg via INTRAVENOUS
  Administered 2014-03-01 (×2): 1 mg via INTRAVENOUS
  Filled 2014-02-28 (×4): qty 1

## 2014-02-28 MED ORDER — NEOSTIGMINE METHYLSULFATE 1 MG/ML IJ SOLN
INTRAMUSCULAR | Status: AC
  Filled 2014-02-28: qty 10

## 2014-02-28 MED ORDER — FOLIC ACID 1 MG PO TABS
1.0000 mg | ORAL_TABLET | Freq: Every day | ORAL | Status: DC
  Administered 2014-02-28 – 2014-03-01 (×2): 1 mg via ORAL
  Filled 2014-02-28 (×2): qty 1

## 2014-02-28 MED ORDER — INSULIN ASPART 100 UNIT/ML ~~LOC~~ SOLN
0.0000 [IU] | Freq: Three times a day (TID) | SUBCUTANEOUS | Status: DC
  Administered 2014-02-28: 3 [IU] via SUBCUTANEOUS
  Administered 2014-03-01: 5 [IU] via SUBCUTANEOUS

## 2014-02-28 MED ORDER — FLEET ENEMA 7-19 GM/118ML RE ENEM: 1.0000 | ENEMA | Freq: Once | RECTAL | Status: AC | PRN

## 2014-02-28 MED ORDER — BISACODYL 5 MG PO TBEC: 5.0000 mg | DELAYED_RELEASE_TABLET | Freq: Every day | ORAL | Status: DC | PRN

## 2014-02-28 MED ORDER — PROPOFOL 10 MG/ML IV BOLUS
INTRAVENOUS | Status: DC | PRN
  Administered 2014-02-28: 140 mg via INTRAVENOUS
  Administered 2014-02-28: 20 mg via INTRAVENOUS

## 2014-02-28 MED ORDER — ONDANSETRON HCL 4 MG/2ML IJ SOLN
INTRAMUSCULAR | Status: DC | PRN
  Administered 2014-02-28: 4 mg via INTRAVENOUS

## 2014-02-28 MED ORDER — BUPIVACAINE-EPINEPHRINE PF 0.5-1:200000 % IJ SOLN
INTRAMUSCULAR | Status: DC | PRN
  Administered 2014-02-28: 30 mL via PERINEURAL

## 2014-02-28 MED ORDER — ROCURONIUM BROMIDE 100 MG/10ML IV SOLN
INTRAVENOUS | Status: DC | PRN
  Administered 2014-02-28: 50 mg via INTRAVENOUS

## 2014-02-28 MED ORDER — DOCUSATE SODIUM 100 MG PO CAPS
100.0000 mg | ORAL_CAPSULE | Freq: Two times a day (BID) | ORAL | Status: DC
  Administered 2014-03-01: 100 mg via ORAL
  Filled 2014-02-28 (×3): qty 1

## 2014-02-28 MED ORDER — DIPHENHYDRAMINE HCL 12.5 MG/5ML PO ELIX: 12.5000 mg | ORAL_SOLUTION | ORAL | Status: DC | PRN

## 2014-02-28 MED ORDER — PHENYLEPHRINE HCL 10 MG/ML IJ SOLN
INTRAMUSCULAR | Status: DC | PRN
  Administered 2014-02-28: 80 ug via INTRAVENOUS

## 2014-02-28 MED ORDER — MENTHOL 3 MG MT LOZG: 1.0000 | LOZENGE | OROMUCOSAL | Status: DC | PRN

## 2014-02-28 MED ORDER — SODIUM CHLORIDE 0.9 % IR SOLN
Status: DC | PRN
  Administered 2014-02-28: 1000 mL

## 2014-02-28 MED ORDER — HYDROMORPHONE HCL PF 1 MG/ML IJ SOLN: 0.2500 mg | INTRAMUSCULAR | Status: DC | PRN

## 2014-02-28 MED ORDER — FERROUS SULFATE 325 (65 FE) MG PO TABS
325.0000 mg | ORAL_TABLET | Freq: Three times a day (TID) | ORAL | Status: DC
  Administered 2014-02-28 – 2014-03-01 (×2): 325 mg via ORAL
  Filled 2014-02-28 (×5): qty 1

## 2014-02-28 MED ORDER — ACETAMINOPHEN 325 MG PO TABS: 650.0000 mg | ORAL_TABLET | Freq: Four times a day (QID) | ORAL | Status: DC | PRN

## 2014-02-28 MED ORDER — CLONIDINE HCL 0.1 MG PO TABS
0.1000 mg | ORAL_TABLET | Freq: Three times a day (TID) | ORAL | Status: DC
  Administered 2014-02-28 – 2014-03-01 (×2): 0.1 mg via ORAL
  Filled 2014-02-28 (×5): qty 1

## 2014-02-28 MED ORDER — ACETAMINOPHEN 650 MG RE SUPP: 650.0000 mg | Freq: Four times a day (QID) | RECTAL | Status: DC | PRN

## 2014-02-28 MED ORDER — PROPOFOL 10 MG/ML IV BOLUS
INTRAVENOUS | Status: AC
  Filled 2014-02-28: qty 20

## 2014-02-28 SURGICAL SUPPLY — 74 items
BLADE SAW SGTL 83.5X18.5 (BLADE) ×3 IMPLANT
BRUSH FEMORAL CANAL (MISCELLANEOUS) IMPLANT
CEMENT BONE SIMPLEX SPEEDSET (Cement) ×6 IMPLANT
CEMENT RESTRICTOR DEPUY SZ 3 (Cement) ×3 IMPLANT
CLOSURE STERI-STRIP 1/2X4 (GAUZE/BANDAGES/DRESSINGS) ×1
CLOSURE WOUND 1/2 X4 (GAUZE/BANDAGES/DRESSINGS) ×2
CLSR STERI-STRIP ANTIMIC 1/2X4 (GAUZE/BANDAGES/DRESSINGS) ×2 IMPLANT
COVER SURGICAL LIGHT HANDLE (MISCELLANEOUS) ×6 IMPLANT
DRAPE INCISE IOBAN 66X45 STRL (DRAPES) ×3 IMPLANT
DRAPE POUCH INSTRU U-SHP 10X18 (DRAPES) ×3 IMPLANT
DRAPE SURG 17X11 SM STRL (DRAPES) ×3 IMPLANT
DRAPE SURG 17X23 STRL (DRAPES) ×3 IMPLANT
DRAPE U-SHAPE 47X51 STRL (DRAPES) ×3 IMPLANT
DRILL BIT 7/64X5 (BIT) IMPLANT
DRSG AQUACEL AG ADV 3.5X10 (GAUZE/BANDAGES/DRESSINGS) ×3 IMPLANT
DRSG EMULSION OIL 3X3 NADH (GAUZE/BANDAGES/DRESSINGS) ×3 IMPLANT
DRSG MEPILEX BORDER 4X8 (GAUZE/BANDAGES/DRESSINGS) ×3 IMPLANT
DRSG PAD ABDOMINAL 8X10 ST (GAUZE/BANDAGES/DRESSINGS) ×3 IMPLANT
DURAPREP 26ML APPLICATOR (WOUND CARE) ×3 IMPLANT
ELECT CAUTERY BLADE 6.4 (BLADE) ×3 IMPLANT
ELECT REM PT RETURN 9FT ADLT (ELECTROSURGICAL) ×3
ELECTRODE REM PT RTRN 9FT ADLT (ELECTROSURGICAL) ×1 IMPLANT
FACESHIELD LNG OPTICON STERILE (SAFETY) ×6 IMPLANT
GLOVE BIO SURGEON STRL SZ7.5 (GLOVE) ×3 IMPLANT
GLOVE BIO SURGEON STRL SZ8 (GLOVE) ×3 IMPLANT
GLOVE EUDERMIC 7 POWDERFREE (GLOVE) ×6 IMPLANT
GLOVE SS BIOGEL STRL SZ 7.5 (GLOVE) ×2 IMPLANT
GLOVE SUPERSENSE BIOGEL SZ 7.5 (GLOVE) ×4
GOWN STRL REUS W/ TWL LRG LVL3 (GOWN DISPOSABLE) ×1 IMPLANT
GOWN STRL REUS W/ TWL XL LVL3 (GOWN DISPOSABLE) ×2 IMPLANT
GOWN STRL REUS W/TWL LRG LVL3 (GOWN DISPOSABLE) ×2
GOWN STRL REUS W/TWL XL LVL3 (GOWN DISPOSABLE) ×4
HANDPIECE INTERPULSE COAX TIP (DISPOSABLE) ×2
HEAD CTA GLOBAL 52X18MM (Orthopedic Implant) ×3 IMPLANT
HUMERAL STEM 10MM (Trauma) ×3 IMPLANT
KIT BASIN OR (CUSTOM PROCEDURE TRAY) ×3 IMPLANT
KIT ROOM TURNOVER OR (KITS) ×6 IMPLANT
MANIFOLD NEPTUNE II (INSTRUMENTS) ×3 IMPLANT
NDL SUT 6 .5 CRC .975X.05 MAYO (NEEDLE) ×1 IMPLANT
NEEDLE 22X1 1/2 (OR ONLY) (NEEDLE) ×3 IMPLANT
NEEDLE HYPO 25GX1X1/2 BEV (NEEDLE) IMPLANT
NEEDLE MAYO TAPER (NEEDLE) ×2
NS IRRIG 1000ML POUR BTL (IV SOLUTION) ×3 IMPLANT
PACK SHOULDER (CUSTOM PROCEDURE TRAY) ×3 IMPLANT
PAD ARMBOARD 7.5X6 YLW CONV (MISCELLANEOUS) ×6 IMPLANT
PASSER SUT SWANSON 36MM LOOP (INSTRUMENTS) IMPLANT
SET HNDPC FAN SPRY TIP SCT (DISPOSABLE) ×1 IMPLANT
SLING ARM IMMOBILIZER LRG (SOFTGOODS) ×3 IMPLANT
SPONGE GAUZE 4X4 12PLY (GAUZE/BANDAGES/DRESSINGS) ×3 IMPLANT
SPONGE LAP 18X18 X RAY DECT (DISPOSABLE) ×6 IMPLANT
SPONGE LAP 4X18 X RAY DECT (DISPOSABLE) ×6 IMPLANT
STEM HUMERAL 10MM (Trauma) ×1 IMPLANT
STRIP CLOSURE SKIN 1/2X4 (GAUZE/BANDAGES/DRESSINGS) ×4 IMPLANT
SUCTION FRAZIER TIP 10 FR DISP (SUCTIONS) ×3 IMPLANT
SUT BONE WAX W31G (SUTURE) IMPLANT
SUT ETHIBOND NAB CT1 #1 30IN (SUTURE) ×6 IMPLANT
SUT FIBERWIRE #2 38 T-5 BLUE (SUTURE) ×6
SUT MNCRL AB 3-0 PS2 18 (SUTURE) ×3 IMPLANT
SUT VIC AB 0 CT1 27 (SUTURE) ×2
SUT VIC AB 0 CT1 27XBRD ANBCTR (SUTURE) ×1 IMPLANT
SUT VIC AB 1 CT1 27 (SUTURE) ×6
SUT VIC AB 1 CT1 27XBRD ANBCTR (SUTURE) ×3 IMPLANT
SUT VIC AB 2-0 CT1 27 (SUTURE) ×4
SUT VIC AB 2-0 CT1 TAPERPNT 27 (SUTURE) ×2 IMPLANT
SUT VICRYL 4-0 PS2 18IN ABS (SUTURE) ×3 IMPLANT
SUTURE FIBERWR #2 38 T-5 BLUE (SUTURE) ×2 IMPLANT
SYR 30ML SLIP (SYRINGE) ×3 IMPLANT
SYR CONTROL 10ML LL (SYRINGE) ×3 IMPLANT
SYRINGE TOOMEY DISP (SYRINGE) IMPLANT
TOWEL OR 17X24 6PK STRL BLUE (TOWEL DISPOSABLE) ×3 IMPLANT
TOWEL OR 17X26 10 PK STRL BLUE (TOWEL DISPOSABLE) ×3 IMPLANT
TOWER CARTRIDGE SMART MIX (DISPOSABLE) ×3 IMPLANT
WATER STERILE IRR 1000ML POUR (IV SOLUTION) ×3 IMPLANT
YANKAUER SUCT BULB TIP NO VENT (SUCTIONS) ×3 IMPLANT

## 2014-02-28 NOTE — H&P (Signed)
Benjamin [name]    Chief Complaint: FAILED FIXATION OF LEFT PROXIMAL HUMERUS FRACTURE  HPI: The patient is a 63 y.o. male s/p ORIF left proximal humerus fracture 02/05/14, subsequently resident of local SNF, at recent followup noted to have complete failure of fixation. Now planned for revision ORIF vs hemiarthroplasty.  Past Medical History  Diagnosis Date  . Diabetes mellitus   . Hypertension   . Alcohol abuse   . Pancreatitis     Past Surgical History  Procedure Laterality Date  . Knee surgery    . Pancreas surgery    . Esophagogastroduodenoscopy N/A 06/19/2013    Procedure: ESOPHAGOGASTRODUODENOSCOPY (EGD);  Surgeon: Cleotis Nipper, MD;  Location: [facility] ENDOSCOPY;  Service: Endoscopy;  Laterality: N/A;  . Orif humerus fracture Left 02/05/2014    Procedure: OPEN REDUCTION INTERNAL FIXATION (ORIF) LEFT  PROXIMAL HUMERUS FRACTURE;  Surgeon: Marin Shutter, MD;  Location: [facility] ORS;  Service: Orthopedics;  Laterality: Left;    Family History  Problem Relation Age of Onset  . Diabetes Mother   . Dementia Father     Social History:  reports that he has been smoking Cigarettes.  He has a 40 pack-year smoking history. He has never used smokeless tobacco. He reports that he drinks alcohol. He reports that he does not use illicit drugs.  Allergies:  Allergies  Allergen Reactions  . Tetanus Toxoids Rash    Medications Prior to Admission  Medication Sig Dispense Refill  . amLODipine (NORVASC) 10 MG tablet Take 1 tablet (10 mg total) by mouth daily.  30 tablet  1  . cloNIDine (CATAPRES) 0.1 MG tablet Take 0.1 mg by mouth 3 (three) times daily.      Marland Kitchen docusate sodium 100 MG CAPS Take 100 mg by mouth 2 (two) times daily.  10 capsule  0  . feeding supplement, GLUCERNA SHAKE, (GLUCERNA SHAKE) LIQD Take 237 mLs by mouth 2 (two) times daily between meals.    0  . ferrous sulfate 325 (65 FE) MG tablet Take 1 tablet (325 mg total) by mouth 3 (three) times daily with meals.    3  . folic acid (FOLVITE) 1  MG tablet Take 1 mg by mouth daily.      . furosemide (LASIX) 20 MG tablet Take 20 mg by mouth 2 (two) times daily.      . insulin aspart (NOVOLOG) 100 UNIT/ML injection Inject 0-9 Units into the skin 3 (three) times daily with meals.  10 mL  11  . Insulin Glargine (LANTUS SOLOSTAR) 100 UNIT/ML Solostar Pen 5 units qhs  5 pen  PRN  . lisinopril (PRINIVIL,ZESTRIL) 20 MG tablet Take 1 tablet (20 mg total) by mouth daily.      . Multiple Vitamin (MULTIVITAMIN WITH MINERALS) TABS Take 1 tablet by mouth daily.  30 tablet  0  . Oxycodone HCl 10 MG TABS Take one tablet by mouth every 4 hours as needed for pain  180 tablet  0  . oxyCODONE-acetaminophen (PERCOCET) 5-325 MG per tablet Take one tablet by mouth every 4 hours as needed for mild pain; Take two tablets by mouth every 4 hours as needed for severe pain  360 tablet  0  . pantoprazole (PROTONIX) 40 MG tablet Take 1 tablet (40 mg total) by mouth daily.      . polyethylene glycol (MIRALAX / GLYCOLAX) packet Take 17 g by mouth daily as needed for mild constipation.  14 each  0  . potassium chloride (MICRO-K) 10 MEQ CR capsule  Take 10-20 mEq by mouth 2 (two) times daily as needed.      . thiamine 100 MG tablet Take 1 tablet (100 mg total) by mouth daily.  30 tablet  3  . zolpidem (AMBIEN) 5 MG tablet Take one tablet by mouth at bedtime as needed for sleep  30 tablet  5     Physical Exam: left shoulder with examination as noted at recent office visit.  Vitals  Temp:  [98 F (36.7 C)] 98 F (36.7 C) (03/26 0807) Pulse Rate:  [102] 102 (03/26 0807) Resp:  [18] 18 (03/26 0807) BP: (123)/(75) 123/75 mmHg (03/26 0807) SpO2:  [99 %] 99 % (03/26 0807) Weight:  [65.318 kg (144 lb)-68.04 kg (150 lb)] 68.04 kg (150 lb) (03/26 0816)  Assessment/Plan  Impression: FAILED FIXATION OF LEFT PROXIMAL HUMERUS FRACTURE   Plan of Action: Procedure(s): REVISION OPEN REDUCTION INTERNAL FIXATION (ORIF)  LEFT PROXIMAL HUMERUS FRACTURE  POSSIBLE HEMI-ARTHROPLASTY    Lux Skilton M 02/28/2014, 9:02 AM

## 2014-02-28 NOTE — Anesthesia Procedure Notes (Addendum)
Anesthesia Regional Block:  Interscalene brachial plexus block  Pre-Anesthetic Checklist: ,, timeout performed, Correct Patient, Correct Site, Correct Laterality, Correct Procedure, Correct Position, site marked, Risks and benefits discussed,  Surgical consent,  Pre-op evaluation,  At surgeon's request and post-op pain management  Laterality: Left  Prep: chloraprep       Needles:  Injection technique: Single-shot  Needle Type: Echogenic Stimulator Needle     Needle Length: 5cm 5 cm Needle Gauge: 22 and 22 G    Additional Needles:  Procedures: ultrasound guided (picture in chart) and nerve stimulator Interscalene brachial plexus block  Nerve Stimulator or Paresthesia:  Response: biceps flexion, 0.45 mA,   Additional Responses:   Narrative:  Start time: 02/28/2014 9:01 AM End time: 02/28/2014 9:17 AM Injection made incrementally with aspirations every 5 mL.  Performed by: Personally  Anesthesiologist: Dr Marcie Bal  Additional Notes: Functioning IV was confirmed and monitors were applied.  A 81mm 22ga Arrow echogenic stimulator needle was used. Sterile prep and drape,hand hygiene and sterile gloves were used.  Negative aspiration and negative test dose prior to incremental administration of local anesthetic. The patient tolerated the procedure well.  Ultrasound guidance: relevent anatomy identified, needle position confirmed, local anesthetic spread visualized around nerve(s), vascular puncture avoided.  Image printed for medical record.    Procedure Name: Intubation Date/Time: 02/28/2014 9:45 AM Performed by: Kyung Rudd Pre-anesthesia Checklist: Patient identified, Emergency Drugs available, Suction available, Patient being monitored and Timeout performed Patient Re-evaluated:Patient Re-evaluated prior to inductionOxygen Delivery Method: Circle system utilized Preoxygenation: Pre-oxygenation with 100% oxygen Intubation Type: IV induction Ventilation: Mask ventilation  without difficulty Laryngoscope Size: Mac and 3 Grade View: Grade I Tube type: Oral Tube size: 7.5 mm Number of attempts: 1 Airway Equipment and Method: Stylet Placement Confirmation: ETT inserted through vocal cords under direct vision,  positive ETCO2 and breath sounds checked- equal and bilateral Secured at: 22 cm Tube secured with: Tape Dental Injury: Teeth and Oropharynx as per pre-operative assessment

## 2014-02-28 NOTE — Anesthesia Preprocedure Evaluation (Addendum)
Anesthesia Evaluation  Patient identified by MRN, date of birth, ID band Patient awake    Reviewed: Allergy & Precautions, H&P , NPO status , Patient's Chart, lab work & pertinent test results  Airway Mallampati: II  Neck ROM: full    Dental  (+) Teeth Intact   Pulmonary Current Smoker,          Cardiovascular hypertension,     Neuro/Psych  Neuromuscular disease    GI/Hepatic PUD, (+)     substance abuse  alcohol use, H/o pancreatitis.   Endo/Other  diabetes, Type 2  Renal/GU      Musculoskeletal   Abdominal   Peds  Hematology   Anesthesia Other Findings   Reproductive/Obstetrics                          Anesthesia Physical Anesthesia Plan  ASA: III  Anesthesia Plan: General and Regional   Post-op Pain Management: MAC Combined w/ Regional for Post-op pain   Induction: Intravenous  Airway Management Planned: Oral ETT  Additional Equipment:   Intra-op Plan:   Post-operative Plan: Extubation in OR  Informed Consent: I have reviewed the patients History and Physical, chart, labs and discussed the procedure including the risks, benefits and alternatives for the proposed anesthesia with the patient or authorized representative who has indicated his/her understanding and acceptance.     Plan Discussed with: CRNA, Anesthesiologist and Surgeon  Anesthesia Plan Comments:         Anesthesia Quick Evaluation

## 2014-02-28 NOTE — Progress Notes (Signed)
Pt has no one here....facility wheelchair will go to PACU, along with his 1 bag of clothes.   DA

## 2014-02-28 NOTE — Plan of Care (Signed)
Problem: Consults Goal: Diagnosis- Total Joint Replacement Hemiarthroplasty Shoulder Left

## 2014-02-28 NOTE — Transfer of Care (Signed)
Immediate Anesthesia Transfer of Care Note  Patient: Benjamin [name]  Procedure(s) Performed: Procedure(s): HEMI-ARTHROPLASTY  (Left)  Patient Location: PACU  Anesthesia Type:General and Regional  Level of Consciousness: awake, patient cooperative and responds to stimulation  Airway & Oxygen Therapy: Patient Spontanous Breathing and Patient connected to nasal cannula oxygen  Post-op Assessment: Report given to PACU RN and Post -op Vital signs reviewed and stable  Post vital signs: Reviewed and stable  Complications: No apparent anesthesia complications

## 2014-02-28 NOTE — Op Note (Signed)
02/28/2014  11:54 AM  PATIENT:   Benjamin [name]  63 y.o. male  PRE-OPERATIVE DIAGNOSIS:  FAILED FIXATION OF LEFT PROXIMAL HUMERUS FRACTURE   POST-OPERATIVE DIAGNOSIS:  same  PROCEDURE:  Hardware removal left proximal humerus, cemented hemiarthroplasty with #10 stem, 52x18 head  SURGEON:  Kelcie Currie, [name] M.D.  ASSISTANTS: Shuford pac   ANESTHESIA:   GET + ISB  EBL: 200cc  SPECIMEN:  Joint fluid for culture  Drains: none   PATIENT DISPOSITION:  PACU - hemodynamically stable.    PLAN OF CARE: Admit for overnight observation  Dictation# [id number]

## 2014-02-28 NOTE — Preoperative (Signed)
Beta Blockers   Reason not to administer Beta Blockers:Not Applicable 

## 2014-02-28 NOTE — Progress Notes (Signed)
Utilization review completed.  

## 2014-03-01 ENCOUNTER — Other Ambulatory Visit: Payer: Self-pay

## 2014-03-01 LAB — GLUCOSE, CAPILLARY: Glucose-Capillary: 212 mg/dL — ABNORMAL HIGH (ref 70–99)

## 2014-03-01 MED ORDER — BISACODYL 5 MG PO TBEC: 5.0000 mg | DELAYED_RELEASE_TABLET | Freq: Every day | ORAL | Status: DC | PRN

## 2014-03-01 MED ORDER — ZOLPIDEM TARTRATE 5 MG PO TABS: 5.0000 mg | ORAL_TABLET | Freq: Every evening | ORAL | Status: DC | PRN

## 2014-03-01 MED ORDER — ACETAMINOPHEN 325 MG PO TABS
650.0000 mg | ORAL_TABLET | Freq: Four times a day (QID) | ORAL | Status: DC | PRN
Start: 2014-03-01 — End: 2014-04-18

## 2014-03-01 MED ORDER — OXYCODONE HCL 10 MG PO TABS: 10.0000 mg | ORAL_TABLET | ORAL | Status: DC | PRN

## 2014-03-01 MED ORDER — DIAZEPAM 5 MG PO TABS: 2.5000 mg | ORAL_TABLET | Freq: Four times a day (QID) | ORAL | Status: DC | PRN

## 2014-03-01 MED ORDER — OXYCODONE HCL 5 MG PO TABS
10.0000 mg | ORAL_TABLET | ORAL | Status: DC | PRN
  Administered 2014-03-01: 15 mg via ORAL
  Filled 2014-03-01: qty 3

## 2014-03-01 NOTE — Progress Notes (Addendum)
D/C to [place] via ambulance in stable condition with copy of chart.  Attempt to call report x 2 to receiving nurse without success.

## 2014-03-01 NOTE — Anesthesia Postprocedure Evaluation (Signed)
Anesthesia Post Note  Patient: Benjamin [name]  Procedure(s) Performed: Procedure(s) (LRB): HEMI-ARTHROPLASTY  (Left)  Anesthesia type: General  Patient location: PACU  Post pain: Pain level controlled and Adequate analgesia  Post assessment: Post-op Vital signs reviewed, Patient's Cardiovascular Status Stable, Respiratory Function Stable, Patent Airway and Pain level controlled  Last Vitals:  Filed Vitals:   03/01/14 0523  BP: 122/68  Pulse: 90  Temp: 36.4 C  Resp: 18    Post vital signs: Reviewed and stable  Level of consciousness: awake, alert  and oriented  Complications: No apparent anesthesia complications

## 2014-03-01 NOTE — Discharge Summary (Signed)
PATIENT ID:      Benjamin [name]  MRN:     [id number] DOB/AGE:    Nov 23, 1951 / 63 y.o.     DISCHARGE SUMMARY  ADMISSION DATE:    02/28/2014 DISCHARGE DATE:    ADMISSION DIAGNOSIS: FAILED FIXATION OF LEFT PROXIMAL HUMERUS FRACTURE  Past Medical History  Diagnosis Date  . Diabetes mellitus   . Hypertension   . Alcohol abuse   . Pancreatitis     DISCHARGE DIAGNOSIS:   Active Problems:   S/P shoulder replacement   PROCEDURE: Procedure(s): HEMI-ARTHROPLASTY  on 02/28/2014  CONSULTS:     HISTORY:  See H&P in chart.  HOSPITAL COURSE:  Benjamin [name] is a 63 y.o. admitted on 02/28/2014 with a chief complaint of ongoing left shoulder pain and found to have a diagnosis of [place] .  They were brought to the operating room on 02/28/2014 and underwent Procedure(s): HEMI-ARTHROPLASTY .    They were given perioperative antibiotics: Anti-infectives   Start     Dose/Rate Route Frequency Ordered Stop   02/28/14 1600  ceFAZolin (ANCEF) IVPB 1 g/50 mL premix     1 g 100 mL/hr over 30 Minutes Intravenous Every 6 hours 02/28/14 1313 03/01/14 0346   02/28/14 0600  ceFAZolin (ANCEF) IVPB 2 g/50 mL premix     2 g 100 mL/hr over 30 Minutes Intravenous On call to O.R. 02/27/14 1452 02/28/14 0950    .  Patient underwent the above named procedure and tolerated it well. The following day they were hemodynamically stable and pain was controlled on oral analgesics. They meds were titrated up to achieve better pain control. OTwas ordered and worked with patient per protocol. They were medically and orthopaedically stable for discharge on day 1. The patient was previously convalescing at a skilled facility and from our standpoint may return there and follow up with [place] in the office as outpatient.    DIAGNOSTIC STUDIES:  RECENT RADIOGRAPHIC STUDIES :  Dg Chest 2 View  02/28/2014   CLINICAL DATA:  Revision of ORIF left proximal humerus fracture, possible hemi arthroplasty   EXAM: CHEST  2 VIEW  COMPARISON:  DG CHEST 1V PORT dated 02/03/2014; DG CHEST 1V PORT dated 01/30/2014; CT CHEST W/O CM dated 01/31/2014; DG CHEST 1V PORT dated 01/28/2014  FINDINGS: Grossly unchanged cardiac silhouette and mediastinal contours. Improved aeration of the lungs. No focal airspace opacities. There is blunting of the bilateral costophrenic angles which may be indicative of chronic trace bilateral effusions. No evidence of edema. No pneumothorax. Grossly unchanged bones including the sequela of sideplate fixation of persistently displaced complex proximal humeral fracture, incompletely evaluated.  IMPRESSION: Chronic trace bilateral effusions without acute cardiopulmonary disease. Specifically, no evidence of edema.   Electronically Signed   By: Sandi Mariscal M.D.   On: 02/28/2014 08:49   Ct Chest Wo Contrast  01/31/2014   CLINICAL DATA:  Evaluate left lung opacity on chest radiograph  EXAM: CT CHEST WITHOUT CONTRAST  TECHNIQUE: Multidetector CT imaging of the chest was performed following the standard protocol without IV contrast.  COMPARISON:  Chest radiograph dated 01/30/2014  FINDINGS: Multifocal patchy opacities in the medial right middle lobe and lingula (series 5/ image 37), left lower lobe (series 5/ image 36), and right lower lobe (series 5/image 39). This appearance has likely progressed from the recent prior chest radiographs and suggests multifocal pneumonia, possibly on the basis of aspiration.  Associated small bilateral pleural effusions.  No pneumothorax.  Visualized thyroid  is unremarkable.  The heart is normal in size. No pericardial effusion. Coronary atherosclerosis in the LAD. Atherosclerotic calcifications of the aortic arch.  No suspicious mediastinal or axillary lymphadenopathy.  Visualized upper abdomen is notable for vascular calcifications in severe hepatic steatosis. Displaced, mildly comminuted left humeral neck fracture (series 2/image 3).  IMPRESSION: Suspected multifocal  pneumonia, possibly on the basis of aspiration, likely progressed from recent chest radiographs.  Associated small bilateral pleural effusions.   Electronically Signed   By: Julian Hy M.D.   On: 01/31/2014 16:31   Dg Chest Port 1 View  02/03/2014   CLINICAL DATA:  PICC line placement.  Diabetic.  EXAM: PORTABLE CHEST - 1 VIEW  COMPARISON:  01/31/2014 CT.  01/30/2014 chest x-ray.  FINDINGS: Right PICC line tip distal superior vena cava level. No gross pneumothorax.  Consolidation lung bases consistent with basilar atelectasis/ infiltrate and small pleural effusions.  Heart size top-normal.  Mildly tortuous aorta.  Right humeral neck fracture. Separation of fracture fragments best demonstrated on recent CT with proximal humeral shaft anteriorly located with respect to the humeral head.  IMPRESSION: Right PICC line tip distal superior vena cava level.  Consolidation lung bases consistent with basilar atelectasis/ infiltrate and small pleural effusions.  Right humeral neck fracture. Separation of fracture fragments best demonstrated on recent CT with proximal humeral shaft anteriorly located with respect to the humeral head.  This is a call report.   Electronically Signed   By: Chauncey Cruel M.D.   On: 02/03/2014 17:02   Dg Chest Port 1 View  01/30/2014   CLINICAL DATA:  Possible pneumonia  EXAM: PORTABLE CHEST - 1 VIEW  COMPARISON:  01/28/2014  FINDINGS: Cardiomediastinal silhouette is stable. No segmental infiltrate or pulmonary edema. Persistent spiculated density left infrahilar region. Again further evaluation with CT scan of the chest is recommended. Displaced fracture of left humerus again noted.  IMPRESSION: Persistent spiculated density left infrahilar region. Further evaluation with CT scan of the chest is recommended. Again noted displaced fracture of the left proximal humerus. No acute infiltrate or pulmonary edema.   Electronically Signed   By: Lahoma Crocker M.D.   On: 01/30/2014 08:29   Dg  Shoulder Left  02/05/2014   CLINICAL DATA:  ORIF left shoulder.  EXAM: LEFT SHOULDER - 2+ VIEW  COMPARISON:  01/28/2014  FINDINGS: There is been internal fixation of patient's left humeral head/neck fracture with lateral fixation plate and screws bridging the fracture site. Hardware is intact as there is near anatomic alignment about the fracture site. There are mild degenerative changes of the glenohumeral joint. Recommend correlation with findings at the time of the procedure.  IMPRESSION: Fixation of patient's humeral head/neck fracture with hardware intact and near anatomic alignment about the fracture site.   Electronically Signed   By: Marin Olp M.D.   On: 02/05/2014 14:19   Dg C-arm 61-120 Min-no Report  02/05/2014   CLINICAL DATA: ORIF shoulder   C-ARM 61-120 MINUTES  Fluoroscopy was utilized by the requesting physician.  No radiographic  interpretation.     RECENT VITAL SIGNS:  Patient Vitals for the past 24 hrs:  BP Temp Pulse Resp SpO2  03/01/14 0523 122/68 mmHg 97.6 F (36.4 C) 90 18 100 %  02/28/14 2055 91/55 mmHg 99.5 F (37.5 C) 95 18 99 %  02/28/14 1311 108/60 mmHg 99.5 F (37.5 C) 102 14 100 %  02/28/14 1259 - 98.3 F (36.8 C) - - -  02/28/14 1254 99/59 mmHg -  99 18 100 %  02/28/14 1245 - - 102 18 100 %  02/28/14 1239 104/64 mmHg - 104 19 100 %  02/28/14 1230 - - 104 21 100 %  02/28/14 1224 101/60 mmHg - 106 18 100 %  02/28/14 1215 - - 110 19 100 %  02/28/14 1209 104/60 mmHg - 117 21 100 %  02/28/14 1208 - 98.6 F (37 C) - - -  02/28/14 0922 81/57 mmHg - 102 - 100 %  02/28/14 0920 82/52 mmHg - 98 16 100 %  .  RECENT EKG RESULTS:    Orders placed during the hospital encounter of 02/28/14  . EKG 12-LEAD  . EKG 12-LEAD    DISCHARGE INSTRUCTIONS:    DISCHARGE MEDICATIONS:     Medication List    STOP taking these medications       insulin aspart 100 UNIT/ML injection  Commonly known as:  novoLOG      TAKE these medications       acetaminophen 325 MG  tablet  Commonly known as:  TYLENOL  Take 2 tablets (650 mg total) by mouth every 6 (six) hours as needed for mild pain (or Fever >/= 101).     amLODipine 5 MG tablet  Commonly known as:  NORVASC  Take 5 mg by mouth daily. Hold if SBP < 100     bisacodyl 5 MG EC tablet  Commonly known as:  DULCOLAX  Take 1 tablet (5 mg total) by mouth daily as needed for moderate constipation.     cloNIDine 0.1 MG tablet  Commonly known as:  CATAPRES  Take 0.1 mg by mouth 2 (two) times daily. Hold if SBP < 100     diazepam 5 MG tablet  Commonly known as:  VALIUM  Take 0.5-1 tablets (2.5-5 mg total) by mouth every 6 (six) hours as needed for muscle spasms or sedation.     DSS 100 MG Caps  Take 100 mg by mouth 2 (two) times daily.     feeding supplement (PRO-STAT SUGAR FREE 64) Liqd  Take 30 mLs by mouth 2 (two) times daily.     ferrous sulfate 325 (65 FE) MG tablet  Take 1 tablet (325 mg total) by mouth 3 (three) times daily with meals.     folic acid 1 MG tablet  Commonly known as:  FOLVITE  Take 1 mg by mouth daily.     furosemide 20 MG tablet  Commonly known as:  LASIX  Take 20 mg by mouth 2 (two) times daily.     insulin glargine 100 UNIT/ML injection  Commonly known as:  LANTUS  Inject 10 Units into the skin at bedtime.     insulin lispro 100 UNIT/ML injection  Commonly known as:  HUMALOG  Inject 2-8 Units into the skin 3 (three) times daily before meals. If cbg 201-250= 2 units; 251-300= 4 units, 301-350= 6 units, 351-400 = 8 units, over 400 = call MD     lactose free nutrition Liqd  Take 237 mLs by mouth 3 (three) times daily between meals.     lisinopril 20 MG tablet  Commonly known as:  PRINIVIL,ZESTRIL  Take 20 mg by mouth daily. Hold if sbp < 100     multivitamin with minerals Tabs tablet  Take 1 tablet by mouth daily.     Oxycodone HCl 10 MG Tabs  Take 1-1.5 tablets (10-15 mg total) by mouth every 3 (three) hours as needed for severe pain.     pantoprazole  40 MG  tablet  Commonly known as:  PROTONIX  Take 1 tablet (40 mg total) by mouth daily.     polyethylene glycol packet  Commonly known as:  MIRALAX / GLYCOLAX  Take 17 g by mouth daily as needed for mild constipation.     thiamine 100 MG tablet  Take 1 tablet (100 mg total) by mouth daily.     vitamin C 500 MG tablet  Commonly known as:  ASCORBIC ACID  Take 500 mg by mouth 2 (two) times daily.     zinc sulfate 220 MG capsule  Take 220 mg by mouth daily.     zolpidem 5 MG tablet  Commonly known as:  AMBIEN  Take 5 mg by mouth at bedtime as needed for sleep.        FOLLOW UP VISIT:       Follow-up Information   Follow up with Marin Shutter, MD. (arrange follow up in 10-14 days)    Specialty:  Orthopedic Surgery   Contact information:   [address] [place] [address] [phone]       DISCHARGE TO: Skilled Facility  DISPOSITION: Good  DISCHARGE CONDITION:  Festus Barren for Dr. Justice Britain 03/01/2014, 8:19 AM

## 2014-03-01 NOTE — Discharge Instructions (Signed)
° °  Benjamin [name]. Supple, M.D., F.A.A.O.S. Orthopaedic Surgery Specializing in Arthroscopic and Reconstructive Surgery of the Shoulder and Knee [phone] [address] [place], [place] [address] - Fax [phone]   POST-OP SHOULDER HEMIARTHROPLASTY INSTRUCTIONS  1. Call the office at [phone] to schedule your first post-op appointment 10-14 days from the date of your surgery.  2. The bandage over your incision is waterproof. You may begin showering with this dressing on. You may leave this dressing on until first follow up appointment within 2 weeks. If you would like to remove it you may do so after the 5th day. Go slow and tug at the borders gently to break the bond the dressing has with the skin. The steri strips may come off with the dressing. At this point if there is no drainage it is okay to go without a bandage or you may cover it with a light guaze and tape. Leave the steri-strips in place over your incision. You can expect drainage that is bloody or yellow in nature that should gradually decrease from day of surgery. Change your dressing daily until drainage is completely resolved, then you may feel free to go without a bandage. You can also expect significant bruising around your shoulder that will drift down your arm and into your chest wall. This is very normal and should resolve over several days.   3. Wear your sling/immobilizer at all times except to perform the exercises below or to occasionally let your arm dangle by your side to stretch your elbow. You also need to sleep in your sling immobilizer until instructed otherwise.  4. Range of motion to your elbow, wrist, and hand are encouraged 3-5 times daily. Exercise to your hand and fingers helps to reduce swelling you may experience.  5. Utilize ice to the shoulder 3-5 times minimum a day and additionally if you are experiencing pain.  6. Prescriptions for a pain medication and a muscle relaxant are provided for  you. It is recommended that if you are experiencing pain that you pain medication alone is not controlling, add the muscle relaxant along with the pain medication which can give additional pain relief. The first 1-2 days is generally the most severe of your pain and then should gradually decrease. As your pain lessens it is recommended that you decrease your use of the pain medications to an "as needed basis'" only and to always comply with the recommended dosages of the pain medications.  7. Pain medications can produce constipation along with their use. If you experience this, the use of an over the counter stool softener or laxative daily is recommended.   8. For most patients, if insurance allows, home health services to include therapy has been arranged.  9. For additional questions or concerns, please do not hesitate to call the office. If after hours there is an answering service to forward your concerns to the physician on call.  POST-OP EXERCISES    OK to allow arm to dangle and perform elbow wrist and hand ROM, passive ROM limits to shoulder 20 ER, 45 ABD 45 FE until follow up with therapy

## 2014-03-01 NOTE — Op Note (Signed)
Benjamin [name], Benjamin [name] NO.:  [id number]  MEDICAL RECORD NO.:  [id number]  LOCATION:  5N03C                        FACILITY:  [name]  PHYSICIAN:  [name]. [name], M.D.  DATE OF BIRTH:  01-Mar-1951  DATE OF PROCEDURE:  02/28/2014 DATE OF DISCHARGE:                              OPERATIVE REPORT   PREOPERATIVE DIAGNOSIS:  Failed fixation of previously repaired left proximal humerus fracture.  POSTOPERATIVE DIAGNOSIS:  Failed fixation of previously repaired left proximal humerus fracture.  PROCEDURE: 1. Hardware removal from left proximal humerus. 2. Cemented left shoulder hemiarthroplasty utilizing a size 10 DePuy     Global stem and a 52 x 18 humeral head.  SURGEON:  [name]. [name], M.D.  Terrence DupontOlivia Mackie A. Shuford, P.A.-C.  ANESTHESIA:  General endotracheal as well as an interscalene block.  ESTIMATED BLOOD LOSS:  100 mL.  SPECIMENS:  Fluid from the joint was sent for routine culture and sensitivity, both aerobic and anaerobic.  DRAINS:  None.  HISTORY:  Benjamin [name] is a 63 year old gentleman who fell back in late February sustaining a severely displaced 2-part left proximal humerus fracture.  He has a number of significant associated medical comorbidities, particularly at the time of his original admission he had been found down after 3 days of unconsciousness with diabetic ketoacidosis as well as chronic alcohol abuse.  He had multiorgan failure.  He was in the ICU for several days and eventually was medically stabilized for orthopedic consultation, at which time we did indeed findings of severely displaced 2-part proximal humerus fracture plate.  He was taken to the operating room at [facility] on February 05, 2014, where I performed an open reduction and internal fixation.  At the time of surgery, alignment was good with proper radiographic position of the fracture site and the hardware.  Unfortunately upon Benjamin [name] return visit to our office late  last week, he was found to have significant swelling about the shoulder and radiographs confirmed that there had been a complete loss of fixation with several screws loose and the humeral head had been completely dislocated posteriorly and clearly there had been some evidence for trauma to the shoulder with loss of fixation, and given the degree of displacement, failure of fixation, he was brought back to the operating room at this time for planned revision ORIF or more likely hemiarthroplasty.  I preoperatively counseled [name] regarding treatment options and risks versus benefits thereof.  Possible surgical complications were all reviewed including potential for bleeding, infection, neurovascular injury, persistent pain, loss of motion, failure of fixation and/or the implant, anesthetic complication, possible need for additional surgery. He understands and accepts and agrees with our planned procedure.  PROCEDURE IN DETAIL:  After undergoing routine preop evaluation, the patient received prophylactic antibiotics.  An interscalene block was established in the holding area by the Anesthesia Department.  Placed supine on the operating table, underwent smooth induction of a general endotracheal anesthesia.  Placed into beach-chair position and appropriately padded and protected.  The left shoulder girdle region was then sterilely prepped and draped in standard fashion.  Time-out was called.  We made an anterior approach to the left  shoulder through the previous incision.  The deltopectoral interval with sharp dissection carried down through the skin and subcu, and electrocautery was used for hemostasis.  We then dissected deeply ultimately identified the cephalic vein and the deltopectoral interval was then redeveloped with adhesions divided beneath the deltoid, and this was retracted laterally and the pectoralis retracted medially.  We then gained access about the fracture site where  there was a large collection of serosanguineous fluid, which was evacuated.  We did take cultures of this fluid both anaerobic and aerobic, although certainly did not have foul smell or any obvious purulence.  Multiple adhesions were identified.  We were ultimately able to gain access to the conjoined tendon which was mobilized and retracted medially and then dissection down to the fracture site, and obtained access to the hardware which we previously placed and these were all removed without difficulty.  We then dissected to the humeral head which I split anteriorly dividing the lesser tuberosity from the articular segment and this was then tagged with a pair of #2 FiberWire sutures, and the subscapularis with a small portion of the lesser tuberosity was then mobilized and retracted medially.  The remnants of the greater tuberosity was then retained with the superior and posterior rotator cuff, which was tagged with #2 FiberWire and the articular head was then removed as a single large fragment.  Once this was completed, I visualized the glenoid and removed the proximal stump of the biceps as we had performed a tenotomy earlier in the procedure.  The glenoid was in good condition.  At this point, we then returned our attention to the humeral shaft which I used a hand reamer up to size 11, and elected to place planned on a size 10 stem.  We placed a size 10 stem that proper degree of retroversion and performed some trial reductions which showed good overall soft tissue balance.  At this point, placed a distal cement plug.  The canal was irrigated and dried.  Cement was introduced into the canal in a retrograde fashion.  Our stem was then seated to the appropriate level at approximately 30 degrees retroversion, and cement was then allowed to harden.  All extra cement was meticulously removed, and the small amount of cement penetrating through the previous drill holes in the humeral shaft  was also carefully removed.  At this point, we then performed some additional trial reductions and based on the size of the removed humeral head, we used a 52 x 18 which showed good soft tissue balance.  The final 52 x 18 head was impacted into the [facility] taper.  Final reduction was performed.  At this point, the previously placed sutures through the tuberosities were then passed through the holes along the metaphyseal aspect of the stem of the implant and this allowed [place] to bring the tuberosities nicely back in relation to the humeral head and the humeral shaft, repairing the tuberosities to one another into the shaft much to our satisfaction, also closed the rotator interval with the pair of figure-of-eight #2 FiberWire sutures.  At this point, the wound was then copiously irrigated.  Hemostasis was obtained. The deltopectoral interval was then reapproximated with a series of #1 Vicryl figure-of-eight sutures.  A 2-0 Vicryl was used for the subcu layer and intracuticular 3-0 Monocryl for the skin followed by Steri- Strips.  Dry dressings were applied.  Left arm was placed in a sling immobilizer.  The patient was awakened, extubated, and taken to recovery  room in stable condition.  Tracy A. Shuford, P.A.-C. was used as an Environmental consultant throughout this case essential for help with positioning of the patient, positioning of the extremity, management of the retractors, tissue manipulation, wound closure, and implantation of the prosthesis and intraoperative decision making.     [name]. Margarite Vessel, M.D.     KMS/MEDQ  D:  02/28/2014  T:  03/01/2014  Job:  [id number]

## 2014-03-01 NOTE — Evaluation (Signed)
Occupational Therapy Evaluation Patient Details Name: Benjamin [name] MRN: [id number] DOB: 11-18-1951 Today's Date: 03/01/2014    History of Present Illness 63 yo male s/p L ORIF humerus.    Clinical Impression   Pt admitted with the above diagnoses and presents with below problem list. Pt will benefit from continued OT to address the below listed deficits and maximize independence with BADLs. Pt has generalized weakness and deconditioning. Education and performance of AROM of L elbow, wrist and hand in standing. PROM of L shoulder: 20 ER, 45 FE, 45 ABD in supine. Education on shoulder precautions with pt able to state precautions. Pt to be d/c to SNF    Follow Up Recommendations  SNF    Equipment Recommendations  Other (comment) (defer to next venue of care)    Recommendations for Other Services       Precautions / Restrictions Precautions Precautions: Fall;Shoulder Type of Shoulder Precautions: no active movement of L shoulder; PROM limits of shoulder: 20 ER, 45 ABD, 45FE Shoulder Interventions: Shoulder sling/immobilizer Precaution Booklet Issued: Yes (comment) Precaution Comments: able to verbalize precautions Required Braces or Orthoses: Sling Restrictions Weight Bearing Restrictions: Yes LUE Weight Bearing: Non weight bearing Other Position/Activity Restrictions: no active movement of L shoulder; PROM limits of shoulder: 20 ER, 45 ABD, 45FE      Mobility Bed Mobility Overal bed mobility: Needs Assistance Bed Mobility: Sit to Supine;Supine to Sit     Supine to sit: HOB elevated Sit to supine: Min guard   General bed mobility comments: used rail; HOB elevated supine>sit following PROM exercises.   Transfers Overall transfer level: Needs assistance Equipment used: 1 person hand held assist Transfers: Sit to/from Stand Sit to Stand: Max assist;+2 safety/equipment;+2 physical assistance (from chair) Stand pivot transfers: +2 safety/equipment            Balance  Overall balance assessment: Needs assistance Sitting-balance support: Single extremity supported Sitting balance-Leahy Scale: Poor Sitting balance - Comments: cues to sit upright, leans forward and to left Postural control: Left lateral lean Standing balance support: During functional activity;Bilateral upper extremity supported Standing balance-Leahy Scale: Poor                      ADL Eating/Feeding: Set up;Minimal assistance Grooming: Minimal assistance;Set up   Upper Body Dressing : Moderate assistance Lower Body Bathing: Moderate assistance Lower Body Dressing: Moderate assistance;Maximal assistance Toilet Transfer: +2 for safety/equipment;Comfort height toilet (+2 for ambulation; generalized weakness and deconditioning) Toileting- Clothing Manipulation and Hygiene: Moderate assistance;Maximal assistance;Sit to/from stand Tub/ Shower Transfer: +2 for safety/equipment Functional mobility during ADLs: +2 for safety/equipment;Maximal assistance;Moderate assistance;Minimal assistance;Min guard;Cueing for safety General ADL Comments: Generalized BUE/BLE weakness and deconditioning. Pt sit>stand max A/+2 ; +2 for safety to ambulate chair <> bed. Mini guard for bed mobility. Reviewed precaution, ROM exercises for elbow, wrist , and hand.      Vision                     Perception     Praxis      Pertinent Vitals/Pain 8/10 pain L shoulder. Increased activity. Nursing notified.      Hand Dominance Right   Extremity/Trunk Assessment Upper Extremity Assessment Upper Extremity Assessment: LUE deficits/detail;Generalized weakness LUE Deficits / Details: s/p L shoulder hemi-arthroplasty following previous surgical effort    Lower Extremity Assessment Lower Extremity Assessment: Defer to PT evaluation;Generalized weakness       Communication Communication Communication: No difficulties   Cognition Arousal/Alertness: Awake/alert Behavior  During Therapy: WFL for  tasks assessed/performed Overall Cognitive Status: Within Functional Limits for tasks assessed Area of Impairment: Safety/judgement;Awareness                   General Comments       Exercises Exercises: Shoulder;Other exercises (PROM: AROM elobow and distal)    Home Living Family/patient expects to be discharged to:: Skilled nursing facility Living Arrangements: Alone Available Help at Discharge: [place] Type of Home: [place]                           Additional Comments: Pt d/c back to SNF      Prior Functioning/Environment Level of Independence: Independent             OT Diagnosis:     OT Problem List: Decreased strength;Decreased range of motion;Decreased activity tolerance;Impaired balance (sitting and/or standing);Decreased safety awareness;Decreased knowledge of use of DME or AE;Decreased knowledge of precautions;Impaired sensation;Impaired UE functional use;Pain   OT Treatment/Interventions: Self-care/ADL training;Therapeutic exercise;DME and/or AE instruction;Manual therapy;Therapeutic activities;Modalities;Balance training    OT Goals(Current goals can be found in the care plan section) Acute Rehab OT Goals Patient Stated Goal: not stated OT Goal Formulation: With patient Time For Goal Achievement: 03/08/14 Potential to Achieve Goals: Good ADL Goals Pt Will Perform Grooming: with set-up;with supervision;sitting;with adaptive equipment Pt Will Perform Upper Body Bathing: with min assist;sitting;with adaptive equipment Pt Will Perform Lower Body Bathing: with mod assist;with adaptive equipment;sitting/lateral leans;sit to/from stand Pt Will Perform Upper Body Dressing: with min guard assist;with min assist;with adaptive equipment;sitting Pt Will Perform Lower Body Dressing: with mod assist;with adaptive equipment;sit to/from stand;sitting/lateral leans Pt Will Transfer to Toilet: with mod  assist;ambulating;regular height toilet;bedside commode;grab bars Pt Will Perform Toileting - Clothing Manipulation and hygiene: with mod assist;with adaptive equipment;sit to/from stand;sitting/lateral leans Pt Will Perform Tub/Shower Transfer: Shower transfer;with mod assist;ambulating;shower seat;tub bench;3 in 1;grab bars;Stand pivot transfer Additional ADL Goal #1: Pt will perform AROM excercises of elbow, wrist, and hands with supervision.  OT Frequency: Min 2X/week   Barriers to D/C: Decreased caregiver support          End of Session: Equipment Utilized During Treatment: Gait belt;Other (comment) (sling)  Activity Tolerance: Patient limited by pain;Patient tolerated treatment well Patient left: in chair;with call bell/phone within reach   Time: 0929-1019 OT Time Calculation (min): 50 min Charges:  OT General Charges $OT Visit: 1 Procedure OT Evaluation $Initial OT Evaluation Tier I: 1 Procedure OT Treatments $Self Care/Home Management : 8-22 mins $Therapeutic Exercise: 23-37 mins G-Codes:    Tyrone Schimke OTR/L Pager: [phone]  03/01/2014, 11:26 AM

## 2014-03-01 NOTE — Telephone Encounter (Signed)
RX faxed to [facility] @ [phone], phone number [phone] 

## 2014-03-03 LAB — BODY FLUID CULTURE: CULTURE: NO GROWTH

## 2014-03-04 ENCOUNTER — Other Ambulatory Visit: Payer: Self-pay | Admitting: *Deleted

## 2014-03-04 MED ORDER — OXYCODONE HCL 5 MG PO TABS: ORAL_TABLET | ORAL | Status: DC

## 2014-03-04 NOTE — Telephone Encounter (Signed)
[facility] 

## 2014-03-05 ENCOUNTER — Encounter (HOSPITAL_COMMUNITY): Payer: Self-pay | Admitting: Orthopedic Surgery

## 2014-03-05 ENCOUNTER — Non-Acute Institutional Stay (SKILLED_NURSING_FACILITY): Payer: PRIVATE HEALTH INSURANCE | Admitting: Adult Health

## 2014-03-05 DIAGNOSIS — S42309A Unspecified fracture of shaft of humerus, unspecified arm, initial encounter for closed fracture: Secondary | ICD-10-CM

## 2014-03-05 DIAGNOSIS — R609 Edema, unspecified: Secondary | ICD-10-CM

## 2014-03-05 DIAGNOSIS — E1165 Type 2 diabetes mellitus with hyperglycemia: Secondary | ICD-10-CM

## 2014-03-05 DIAGNOSIS — I10 Essential (primary) hypertension: Secondary | ICD-10-CM

## 2014-03-05 DIAGNOSIS — F1010 Alcohol abuse, uncomplicated: Secondary | ICD-10-CM

## 2014-03-05 DIAGNOSIS — K5900 Constipation, unspecified: Secondary | ICD-10-CM

## 2014-03-05 DIAGNOSIS — Z96619 Presence of unspecified artificial shoulder joint: Secondary | ICD-10-CM

## 2014-03-05 DIAGNOSIS — K279 Peptic ulcer, site unspecified, unspecified as acute or chronic, without hemorrhage or perforation: Secondary | ICD-10-CM

## 2014-03-05 DIAGNOSIS — E118 Type 2 diabetes mellitus with unspecified complications: Secondary | ICD-10-CM

## 2014-03-05 DIAGNOSIS — D649 Anemia, unspecified: Secondary | ICD-10-CM

## 2014-03-05 DIAGNOSIS — IMO0002 Reserved for concepts with insufficient information to code with codable children: Secondary | ICD-10-CM

## 2014-03-05 LAB — ANAEROBIC CULTURE

## 2014-03-06 ENCOUNTER — Encounter: Payer: Self-pay | Admitting: Adult Health

## 2014-03-06 DIAGNOSIS — E1165 Type 2 diabetes mellitus with hyperglycemia: Secondary | ICD-10-CM | POA: Insufficient documentation

## 2014-03-06 DIAGNOSIS — R609 Edema, unspecified: Secondary | ICD-10-CM | POA: Insufficient documentation

## 2014-03-06 DIAGNOSIS — K5900 Constipation, unspecified: Secondary | ICD-10-CM | POA: Insufficient documentation

## 2014-03-06 DIAGNOSIS — E118 Type 2 diabetes mellitus with unspecified complications: Secondary | ICD-10-CM

## 2014-03-06 DIAGNOSIS — IMO0002 Reserved for concepts with insufficient information to code with codable children: Secondary | ICD-10-CM | POA: Insufficient documentation

## 2014-03-06 NOTE — Progress Notes (Signed)
Patient ID: Benjamin [name], male   DOB: 10/09/1951, 63 y.o.   MRN: [id number]     [place]  Allergies  Allergen Reactions  . Tetanus Toxoids Rash     Chief Complaint  Patient presents with  . Hospitalization Follow-up    HPI:  He has been hospitalized after a failed left humerus fixation. He has had a left shoulder replacement. He is here for short term rehab with his goal to return home.    Past Medical History  Diagnosis Date  . Diabetes mellitus   . Hypertension   . Alcohol abuse   . Pancreatitis     Past Surgical History  Procedure Laterality Date  . Knee surgery    . Pancreas surgery    . Esophagogastroduodenoscopy N/A 06/19/2013    Procedure: ESOPHAGOGASTRODUODENOSCOPY (EGD);  Surgeon: Cleotis Nipper, MD;  Location: [facility] ENDOSCOPY;  Service: Endoscopy;  Laterality: N/A;  . Orif humerus fracture Left 02/05/2014    Procedure: OPEN REDUCTION INTERNAL FIXATION (ORIF) LEFT  PROXIMAL HUMERUS FRACTURE;  Surgeon: Marin Shutter, MD;  Location: [facility] ORS;  Service: Orthopedics;  Laterality: Left;  . Humeral hemiarthroplasty Left 02/28/2014    DR SUPPLE  . Shoulder hemi-arthroplasty Left 02/28/2014    Procedure: HEMI-ARTHROPLASTY ;  Surgeon: Marin Shutter, MD;  Location: [place];  Service: Orthopedics;  Laterality: Left;    VITAL SIGNS BP 110/60  Pulse 100  Ht 5' 8"  (1.727 m)  Wt 146 lb (66.225 kg)  BMI 22.20 kg/m2   Patient's Medications  New Prescriptions   No medications on file  Previous Medications   ACETAMINOPHEN (TYLENOL) 325 MG TABLET    Take 2 tablets (650 mg total) by mouth every 6 (six) hours as needed for mild pain (or Fever >/= 101).   AMINO ACIDS-PROTEIN HYDROLYS (FEEDING SUPPLEMENT, PRO-STAT SUGAR FREE 64,) LIQD    Take 30 mLs by mouth 2 (two) times daily.   AMLODIPINE (NORVASC) 5 MG TABLET    Take 10 mg by mouth daily. Hold if SBP < 100   BISACODYL (DULCOLAX) 5 MG EC TABLET    Take 1 tablet (5 mg total) by mouth daily as needed for moderate constipation.   CLONIDINE (CATAPRES) 0.1 MG TABLET    Take 0.1 mg by mouth 2 (two) times daily. Hold if SBP < 100   DIAZEPAM (VALIUM) 5 MG TABLET    Take 0.5-1 tablets (2.5-5 mg total) by mouth every 6 (six) hours as needed for muscle spasms or sedation.   DOCUSATE SODIUM 100 MG CAPS    Take 100 mg by mouth 2 (two) times daily.   FERROUS SULFATE 325 (65 FE) MG TABLET    Take 1 tablet (325 mg total) by mouth 3 (three) times daily with meals.   FOLIC ACID (FOLVITE) 1 MG TABLET    Take 1 mg by mouth daily.   FUROSEMIDE (LASIX) 20 MG TABLET    Take 20 mg by mouth 2 (two) times daily.   INSULIN GLARGINE (LANTUS) 100 UNIT/ML INJECTION    Inject 10 Units into the skin at bedtime.   INSULIN LISPRO (HUMALOG) 100 UNIT/ML INJECTION    Inject 2-8 Units into the skin 3 (three) times daily before meals. If cbg 201-250= 2 units; 251-300= 4 units, 301-350= 6 units, 351-400 = 8 units, over 400 = call MD   LACTOSE FREE NUTRITION (BOOST) LIQD    Take 237 mLs by mouth 3 (three) times daily between meals.   LISINOPRIL (PRINIVIL,ZESTRIL) 20 MG TABLET  Take 20 mg by mouth daily. Hold if sbp < 100   MULTIPLE VITAMIN (MULTIVITAMIN WITH MINERALS) TABS    Take 1 tablet by mouth daily.   OXYCODONE 10 MG TABS    Take 1-1.5 tablets (10-15 mg total) by mouth every 3 (three) hours as needed for severe pain.   PANTOPRAZOLE (PROTONIX) 40 MG TABLET    Take 1 tablet (40 mg total) by mouth daily.   POLYETHYLENE GLYCOL (MIRALAX / GLYCOLAX) PACKET    Take 17 g by mouth daily as needed for mild constipation.   THIAMINE 100 MG TABLET    Take 1 tablet (100 mg total) by mouth daily.   VITAMIN C (ASCORBIC ACID) 500 MG TABLET    Take 500 mg by mouth 2 (two) times daily.   ZINC SULFATE 220 MG CAPSULE    Take 220 mg by mouth daily.   ZOLPIDEM (AMBIEN) 5 MG TABLET    Take 1 tablet (5 mg total) by mouth at bedtime as needed for sleep.  Modified Medications   No medications on file  Discontinued Medications   OXYCODONE (ROXICODONE) 5 MG IMMEDIATE RELEASE  TABLET    Take two tablets by mouth every 3 hours as needed for moderate pain; Take three tablets by mouth every 3 hours as needed for severe pain    SIGNIFICANT DIAGNOSTIC EXAMS   02-28-14: chest x-ray: Chronic trace bilateral effusions without acute cardiopulmonary disease. Specifically, no evidence of edema.    LABS REVIEWED:  02-19-14: wbc 10.4; hgb 12.7; hct 38.2; mcv 92; plt 548; glucose 328; glucose 20;creat 0.54; k+5.8; na++ 131; alk phos 256; ast 19; alt 22; albumin 3.1 02-22-14: glucose 289; bun 16; creat 0.6; k+4.4; na++133  02-28-14: wbc 10.9; hgb 11.6; hct 3.6; mcv 89.1. plt 543; glucose 85; bun 1; creat 0.57; k+4.7; na++130;alk phos 204; ast 19; alt 15; albumin 1.9   Review of Systems  Constitutional: Negative for malaise/fatigue.  Respiratory: Negative for cough and shortness of breath.   Cardiovascular: Negative for chest pain.  Gastrointestinal: Negative for heartburn, abdominal pain and constipation.  Musculoskeletal: Negative for joint pain and myalgias.       Shoulder pain is managed  Skin: Negative.   Psychiatric/Behavioral: Negative for depression. The patient is not nervous/anxious.     Physical Exam  Constitutional: He is oriented to person, place, and time. No distress.  thin  Neck: Neck supple. No JVD present.  Cardiovascular: Normal rate, regular rhythm and intact distal pulses.   Respiratory: Breath sounds normal. No respiratory distress. He has no wheezes.  GI: Soft. Bowel sounds are normal. He exhibits no distension. There is no tenderness.  Musculoskeletal: He exhibits no edema.  left upper extremity in sling; is able to move other extremities.   Neurological: He is alert and oriented to person, place, and time.  Skin: Skin is warm and dry. He is not diaphoretic.  Psychiatric: He has a normal mood and affect.      ASSESSMENT/ PLAN:  1. Hypertension: will continue norvasc 10 mg daily; clonidine 0.1 mg twice daily; lisinopril 20 mg daily and will  monitor his status   2. Anemia: will continue iron three times daily  3. Alcohol abuse: is not consuming alcohol at this time; will continue thiamine 825 mg daily and folic acid 1 mg daily  4. Left humerus fracture with a left shoulder replacement: will continue therapy as directed; will continue oxycodone 10-15 mg every 3 hours as needed for pain; will continue valium 2.5-5 mg every  6 hours as needed for spasms and will monitor his status   5. Edema: is stable will continue lasix 20 mg twice daily and will monitor   6. PUD: is stable will continue protonix 40 mg daily   7. Diabetes: will continue lantus 10 units nightly and will continue humalog SSI will monitor   8. Constipation: will continue colace twice daily and miralax as needed     Time spent with patient 50 minutes.   Ok Edwards NP [facility]  Contact [phone] Monday through Friday 8am- 5pm  After hours call [phone]

## 2014-03-07 ENCOUNTER — Encounter: Payer: Self-pay | Admitting: Internal Medicine

## 2014-03-07 ENCOUNTER — Non-Acute Institutional Stay (SKILLED_NURSING_FACILITY): Payer: PRIVATE HEALTH INSURANCE | Admitting: Internal Medicine

## 2014-03-07 DIAGNOSIS — K5900 Constipation, unspecified: Secondary | ICD-10-CM

## 2014-03-07 DIAGNOSIS — R609 Edema, unspecified: Secondary | ICD-10-CM

## 2014-03-07 DIAGNOSIS — K279 Peptic ulcer, site unspecified, unspecified as acute or chronic, without hemorrhage or perforation: Secondary | ICD-10-CM

## 2014-03-07 DIAGNOSIS — D649 Anemia, unspecified: Secondary | ICD-10-CM

## 2014-03-07 DIAGNOSIS — F1010 Alcohol abuse, uncomplicated: Secondary | ICD-10-CM

## 2014-03-07 DIAGNOSIS — I10 Essential (primary) hypertension: Secondary | ICD-10-CM

## 2014-03-07 DIAGNOSIS — S42309A Unspecified fracture of shaft of humerus, unspecified arm, initial encounter for closed fracture: Secondary | ICD-10-CM

## 2014-03-07 NOTE — Progress Notes (Signed)
Patient ID: Benjamin [name], male   DOB: 05-28-1951, 63 y.o.   MRN: [id number]       PCP: [name], MD   Allergies  Allergen Reactions  . Tetanus Toxoids Rash    Chief Complaint: new admission  HPI:  63 y/o male patient is here for STR after hospital admission for left shoulder hemiarthroplasty. He had a failed fixation of left proximal humerus fracture and had to undergo the above mentioned procedure. He mentions that his pain is not under control. He has been working with therapy team and has been making progress. No other complaints  Review of Systems:  Constitutional: Negative for fever, chills, weight loss, malaise/fatigue and diaphoresis.  HENT: Negative for congestion, hearing loss and sore throat.   Eyes: Negative for eye pain, blurred vision Respiratory: Negative for cough, sputum production, shortness of breath and wheezing.   Cardiovascular: Negative for chest pain, palpitations, orthopnea and leg swelling.  Gastrointestinal: Negative for heartburn, nausea, vomiting, abdominal pain, diarrhea and constipation.  Genitourinary: Negative for dysuria, urgency, frequency, hematuria and flank pain.  Musculoskeletal: Negative for back pain, falls, joint pain and myalgias.  Skin: Negative for itching and rash.  Neurological: Negative for dizziness, tingling, focal weakness and headaches.  Psychiatric/Behavioral: Negative for depression and memory loss. The patient is not nervous/anxious.     Past Medical History  Diagnosis Date  . Diabetes mellitus   . Hypertension   . Alcohol abuse   . Pancreatitis    Past Surgical History  Procedure Laterality Date  . Knee surgery    . Pancreas surgery    . Esophagogastroduodenoscopy N/A 06/19/2013    Procedure: ESOPHAGOGASTRODUODENOSCOPY (EGD);  Surgeon: Cleotis Nipper, MD;  Location: [facility] ENDOSCOPY;  Service: Endoscopy;  Laterality: N/A;  . Orif humerus fracture Left 02/05/2014    Procedure: OPEN REDUCTION INTERNAL FIXATION (ORIF) LEFT   PROXIMAL HUMERUS FRACTURE;  Surgeon: Marin Shutter, MD;  Location: [facility];  Service: Orthopedics;  Laterality: Left;  . Humeral hemiarthroplasty Left 02/28/2014    DR SUPPLE  . Shoulder hemi-arthroplasty Left 02/28/2014    Procedure: HEMI-ARTHROPLASTY ;  Surgeon: Marin Shutter, MD;  Location: [place];  Service: Orthopedics;  Laterality: Left;   Social History:   reports that he quit smoking about 6 months ago. His smoking use included Cigarettes. He has a 40 pack-year smoking history. He has never used smokeless tobacco. He reports that he drinks alcohol. He reports that he does not use illicit drugs.  Family History  Problem Relation Age of Onset  . Diabetes Mother   . Dementia Father     Medications: Patient's Medications  New Prescriptions   No medications on file  Previous Medications   ACETAMINOPHEN (TYLENOL) 325 MG TABLET    Take 2 tablets (650 mg total) by mouth every 6 (six) hours as needed for mild pain (or Fever >/= 101).   AMINO ACIDS-PROTEIN HYDROLYS (FEEDING SUPPLEMENT, PRO-STAT SUGAR FREE 64,) LIQD    Take 30 mLs by mouth 2 (two) times daily.   AMLODIPINE (NORVASC) 5 MG TABLET    Take 10 mg by mouth daily. Hold if SBP < 100   BISACODYL (DULCOLAX) 5 MG EC TABLET    Take 1 tablet (5 mg total) by mouth daily as needed for moderate constipation.   CLONIDINE (CATAPRES) 0.1 MG TABLET    Take 0.1 mg by mouth 2 (two) times daily. Hold if SBP < 100   DIAZEPAM (VALIUM) 5 MG TABLET    Take 0.5-1 tablets (  2.5-5 mg total) by mouth every 6 (six) hours as needed for muscle spasms or sedation.   DOCUSATE SODIUM 100 MG CAPS    Take 100 mg by mouth 2 (two) times daily.   FERROUS SULFATE 325 (65 FE) MG TABLET    Take 1 tablet (325 mg total) by mouth 3 (three) times daily with meals.   FOLIC ACID (FOLVITE) 1 MG TABLET    Take 1 mg by mouth daily.   FUROSEMIDE (LASIX) 20 MG TABLET    Take 20 mg by mouth 2 (two) times daily.   INSULIN GLARGINE (LANTUS) 100 UNIT/ML INJECTION    Inject 10 Units  into the skin at bedtime.   INSULIN LISPRO (HUMALOG) 100 UNIT/ML INJECTION    Inject 2-8 Units into the skin 3 (three) times daily before meals. If cbg 201-250= 2 units; 251-300= 4 units, 301-350= 6 units, 351-400 = 8 units, over 400 = call MD   LACTOSE FREE NUTRITION (BOOST) LIQD    Take 237 mLs by mouth 3 (three) times daily between meals.   LISINOPRIL (PRINIVIL,ZESTRIL) 20 MG TABLET    Take 20 mg by mouth daily. Hold if sbp < 100   MULTIPLE VITAMIN (MULTIVITAMIN WITH MINERALS) TABS    Take 1 tablet by mouth daily.   OXYCODONE 10 MG TABS    Take 1-1.5 tablets (10-15 mg total) by mouth every 3 (three) hours as needed for severe pain.   PANTOPRAZOLE (PROTONIX) 40 MG TABLET    Take 1 tablet (40 mg total) by mouth daily.   POLYETHYLENE GLYCOL (MIRALAX / GLYCOLAX) PACKET    Take 17 g by mouth daily as needed for mild constipation.   THIAMINE 100 MG TABLET    Take 1 tablet (100 mg total) by mouth daily.   VITAMIN C (ASCORBIC ACID) 500 MG TABLET    Take 500 mg by mouth 2 (two) times daily.   ZINC SULFATE 220 MG CAPSULE    Take 220 mg by mouth daily.   ZOLPIDEM (AMBIEN) 5 MG TABLET    Take 1 tablet (5 mg total) by mouth at bedtime as needed for sleep.  Modified Medications   No medications on file  Discontinued Medications   No medications on file     Physical Exam: BP 106/58  Pulse 90  Temp(Src) 98.2 F (36.8 C)  Resp 18  Wt 143 lb (64.864 kg)  General- elderly male in no acute distress Head- atraumatic, normocephalic Eyes- PERRLA, EOMI, no pallor, no icterus, no discharge Neck- no lymphadenopathy Cardiovascular- normal s1,s2, no murmurs/ rubs/ gallops Respiratory- bilateral clear to auscultation, no wheeze, no rhonchi, no crackles, no use of accessory muscles Abdomen- bowel sounds present, soft, non tender Musculoskeletal- left upper extremity in sling, able to move his fingers, no edema, able to move other extremities Neurological- no focal deficit Skin- warm and dry Psychiatry-  alert and oriented to person, place and time, normal mood and affect   Labs reviewed: Basic Metabolic Panel:  Recent Labs  01/31/14 0300  02/03/14 0551  02/05/14 0450 02/06/14 0615 02/19/14 02/22/14 02/28/14 0812  NA 144  < > 131*  < > 130* 130* 131* 133* 130*  K 3.1*  < > 3.2*  < > 3.8 3.5* 5.8* 4.4 4.7  CL 109  < > 95*  < > 98 96  --   --  92*  CO2 25  < > 24  < > 24 23  --   --  26  GLUCOSE 161*  < >  116*  < > 98 171*  --   --  85  BUN 22  < > 8  < > 8 7 20 16 11   CREATININE 0.62  < > 0.49*  < > 0.56 0.50 0.5* 0.6 0.57  CALCIUM 8.1*  < > 7.7*  < > 8.0* 7.9*  --   --  8.7  MG 2.0  --  1.5  --   --  1.5  --   --   --   PHOS 1.3*  --  2.1*  --   --  3.0  --   --   --   < > = values in this interval not displayed. Liver Function Tests:  Recent Labs  02/03/14 0551 02/06/14 0615 02/19/14 02/28/14 0812  AST 29 22 19 19   ALT 46 30 22 15   ALKPHOS 258* 235* 256* 204*  BILITOT 0.4 0.3  --  0.4  PROT 4.6* 4.7*  --  5.7*  ALBUMIN 1.9* 1.9*  --  1.9*    Recent Labs  06/17/13 2136 01/28/14 2200  LIPASE 6* 238*    Recent Labs  01/29/14 0430  AMMONIA 69*   CBC:  Recent Labs  06/17/13 2343  02/07/14 1945 02/08/14 0400 02/19/14 02/28/14 0812  WBC 5.2  < > 7.1 4.9 10.4 10.9*  NEUTROABS 3.5  --  5.4  --   --  8.5*  HGB 6.4*  < > 11.0* 10.4* 12.7* 11.6*  HCT 17.8*  < > 32.2* 30.1* 38* 33.6*  MCV 89.4  < > 90.2 90.4  --  89.1  PLT 275  < > 335 332 548* 543*  < > = values in this interval not displayed. Cardiac Enzymes:  Recent Labs  01/28/14 2200 01/29/14 0220 01/29/14 0831 01/29/14 1355 01/29/14 1838 01/30/14 0450  CKTOTAL 481*  --   --   --  620* 481*  TROPONINI  --  <0.30 <0.30 <0.30  --   --    BNP: No components found with this basename: POCBNP,  CBG:  Recent Labs  02/28/14 1636 02/28/14 2131 03/01/14 0632  GLUCAP 154* 185* 212*    Radiological Exams: RECENT RADIOGRAPHIC STUDIES :  Dg Chest 2 View  02/28/2014   CLINICAL DATA:  Revision of  ORIF left proximal humerus fracture, possible hemi arthroplasty  EXAM: CHEST  2 VIEW  COMPARISON:  DG CHEST 1V PORT dated 02/03/2014; DG CHEST 1V PORT dated 01/30/2014; CT CHEST W/O CM dated 01/31/2014; DG CHEST 1V PORT dated 01/28/2014  FINDINGS: Grossly unchanged cardiac silhouette and mediastinal contours. Improved aeration of the lungs. No focal airspace opacities. There is blunting of the bilateral costophrenic angles which may be indicative of chronic trace bilateral effusions. No evidence of edema. No pneumothorax. Grossly unchanged bones including the sequela of sideplate fixation of persistently displaced complex proximal humeral fracture, incompletely evaluated.  IMPRESSION: Chronic trace bilateral effusions without acute cardiopulmonary disease. Specifically, no evidence of edema.   Electronically Signed   By: Sandi Mariscal M.D.   On: 02/28/2014 08:49   Ct Chest Wo Contrast  01/31/2014   CLINICAL DATA:  Evaluate left lung opacity on chest radiograph  EXAM: CT CHEST WITHOUT CONTRAST  TECHNIQUE: Multidetector CT imaging of the chest was performed following the standard protocol without IV contrast.  COMPARISON:  Chest radiograph dated 01/30/2014  FINDINGS: Multifocal patchy opacities in the medial right middle lobe and lingula (series 5/ image 37), left lower lobe (series 5/ image 36), and right lower lobe (series 5/image  39). This appearance has likely progressed from the recent prior chest radiographs and suggests multifocal pneumonia, possibly on the basis of aspiration.  Associated small bilateral pleural effusions.  No pneumothorax.  Visualized thyroid is unremarkable.  The heart is normal in size. No pericardial effusion. Coronary atherosclerosis in the LAD. Atherosclerotic calcifications of the aortic arch.  No suspicious mediastinal or axillary lymphadenopathy.  Visualized upper abdomen is notable for vascular calcifications in severe hepatic steatosis. Displaced, mildly comminuted left humeral neck  fracture (series 2/image 3).  IMPRESSION: Suspected multifocal pneumonia, possibly on the basis of aspiration, likely progressed from recent chest radiographs.  Associated small bilateral pleural effusions.   Electronically Signed   By: Julian Hy M.D.   On: 01/31/2014 16:31   Dg Chest Port 1 View  02/03/2014   CLINICAL DATA:  PICC line placement.  Diabetic.  EXAM: PORTABLE CHEST - 1 VIEW  COMPARISON:  01/31/2014 CT.  01/30/2014 chest x-ray.  FINDINGS: Right PICC line tip distal superior vena cava level. No gross pneumothorax.  Consolidation lung bases consistent with basilar atelectasis/ infiltrate and small pleural effusions.  Heart size top-normal.  Mildly tortuous aorta.  Right humeral neck fracture. Separation of fracture fragments best demonstrated on recent CT with proximal humeral shaft anteriorly located with respect to the humeral head.  IMPRESSION: Right PICC line tip distal superior vena cava level.  Consolidation lung bases consistent with basilar atelectasis/ infiltrate and small pleural effusions.  Right humeral neck fracture. Separation of fracture fragments best demonstrated on recent CT with proximal humeral shaft anteriorly located with respect to the humeral head.  This is a call report.   Electronically Signed   By: Chauncey Cruel M.D.   On: 02/03/2014 17:02   Dg Chest Port 1 View  01/30/2014   CLINICAL DATA:  Possible pneumonia  EXAM: PORTABLE CHEST - 1 VIEW  COMPARISON:  01/28/2014  FINDINGS: Cardiomediastinal silhouette is stable. No segmental infiltrate or pulmonary edema. Persistent spiculated density left infrahilar region. Again further evaluation with CT scan of the chest is recommended. Displaced fracture of left humerus again noted.  IMPRESSION: Persistent spiculated density left infrahilar region. Further evaluation with CT scan of the chest is recommended. Again noted displaced fracture of the left proximal humerus. No acute infiltrate or pulmonary edema.   Electronically  Signed   By: Lahoma Crocker M.D.   On: 01/30/2014 08:29   Dg Shoulder Left  02/05/2014   CLINICAL DATA:  ORIF left shoulder.  EXAM: LEFT SHOULDER - 2+ VIEW  COMPARISON:  01/28/2014  FINDINGS: There is been internal fixation of patient's left humeral head/neck fracture with lateral fixation plate and screws bridging the fracture site. Hardware is intact as there is near anatomic alignment about the fracture site. There are mild degenerative changes of the glenohumeral joint. Recommend correlation with findings at the time of the procedure.  IMPRESSION: Fixation of patient's humeral head/neck fracture with hardware intact and near anatomic alignment about the fracture site.   Electronically Signed   By: Marin Olp M.D.   On: 02/05/2014 14:19    Assessment/Plan  Left humerus fracture- s/p left shoulder hemiarthroplasty. Though patient complaints of pain, he appears in no distress and heart rate controlled. will continue oxycodone 10-15 mg every 3 hours as needed for pain with valium 2.5-5 mg every 6 hours as needed for spasms and will monitor his status. Will have him work with PT and OT for therapy  Hypertension- bp on lower side of normal. continue norvasc 10 mg  daily, lisinopril 20 mg daily. Decrease clonidine to 0.1 mg daily  Constipation- continue colace twice daily and miralax as needed   Edema- continue lasix, stable at present, check bmp  Gastritis- continue protonix 40 mg daily  Anemia- continue iron three times daily, monitor cbc  Diabetes- monitor cbg, continue lantus 10 units nightly and humalog SSI   Alcohol abuse- continue folic acid and thiamine supplement. encouraged to stop drinking. Continue nutritional supplement with iron supplement as well    Goals of care: STR   Labs/tests ordered: cbc, bmp    Blanchie Serve, MD  [facility] [phone] (Monday-Friday 8 am - 5 pm) [phone] (afterhours)

## 2014-03-15 ENCOUNTER — Non-Acute Institutional Stay (SKILLED_NURSING_FACILITY): Payer: PRIVATE HEALTH INSURANCE | Admitting: Internal Medicine

## 2014-03-15 DIAGNOSIS — IMO0002 Reserved for concepts with insufficient information to code with codable children: Secondary | ICD-10-CM

## 2014-03-15 NOTE — Progress Notes (Signed)
Patient ID: Benjamin [name], male   DOB: 20-Aug-1951, 63 y.o.   MRN: [id number] Facility; [facility] Chief complaint; left shoulder drainage History; this is a chronically ill 63 year old man who is a type I diabetic care with a history of alcohol abuse and pancreatitis. He was admitted on 3/26 the hospital after a failed fixation of the left proximal humerus fracture. He underwent a left hemiarthroplasty. His original surgery I believe was in early March at which time he had an ORIF of a proximal left humerus fracture. The left shoulder had been doing well with no concerns. A DuoDERM-like dressing was left in place over the anterior shoulders incision in keeping with orthopedics instructions. Either last night or this morning it was noted that there was drainage underneath the dressing and have been asked to look at this. The patient has not been systemically unwell.  Physical examination Gen. patient is not in any distress. Left shoulder in the mid part of the original incision has a small opening which drains moderate to large amount of sanguinous drainage. There appears to be some degree of purulence associated with this at some points although he is not tender, he states there is a significant amount of pain. I drained as much as I can from this. I'm going to put silver alginate over the small open area and have that staff change this daily. He will need a followup with orthopedics next week. Pending the deep culture I did of this I am going to put him on doxycycline and Cipro. Concern about underlying prosthetic for periprosthetic infection is certainly present although this may all be a resolving hematoma.  Impressions #1 cellulitis at the site of the previous left shoulder incision. The CNS is been done I will check his inflammatory markers including a sedimentation rate and C. reactive protein. Started on doxy and Cipro empirically while we await cultures which was done via deep probe of the wound.

## 2014-03-19 ENCOUNTER — Non-Acute Institutional Stay (SKILLED_NURSING_FACILITY): Payer: PRIVATE HEALTH INSURANCE | Admitting: Internal Medicine

## 2014-03-19 DIAGNOSIS — IMO0002 Reserved for concepts with insufficient information to code with codable children: Secondary | ICD-10-CM

## 2014-03-19 NOTE — Progress Notes (Signed)
Patient ID: Benjamin [name], male   DOB: 12/15/1950, 63 y.o.   MRN: [id number]  Facility; [facility] Chief complaint; left shoulder drainage History; this is a chronically ill 63 year old man who is a type I diabetic care with a history of alcohol abuse and pancreatitis. He was admitted on 3/26 the hospital after a failed fixation of the left proximal humerus fracture. He underwent a left hemiarthroplasty. His original surgery I believe was in early March at which time he had an ORIF of a proximal left humerus fracture. The left shoulder had been doing well with no concerns. A DuoDERM-like dressing was left in place over the anterior shoulders incision in keeping with orthopedics instructions.On [facility] staff note Drainage from under the dressing. I saw this and obtained a deep culture from a sinus in the mid part of the incision. This has returned with has come back with enterococcus. He has been on doxy and cipro. Patient states the drainage and pain is better.   Physical examination Gen. patient is not in any distress. Left shoulder; incision opening has closed. There is still some tenderness however the area appears better. #1 cellulitis; culture has grown enterococcus which is pen sensitive. Start amoxil. Ortho f/u tomorrow.

## 2014-04-03 ENCOUNTER — Non-Acute Institutional Stay (SKILLED_NURSING_FACILITY): Payer: PRIVATE HEALTH INSURANCE | Admitting: Internal Medicine

## 2014-04-03 DIAGNOSIS — E118 Type 2 diabetes mellitus with unspecified complications: Principal | ICD-10-CM

## 2014-04-03 DIAGNOSIS — S42309A Unspecified fracture of shaft of humerus, unspecified arm, initial encounter for closed fracture: Secondary | ICD-10-CM

## 2014-04-03 DIAGNOSIS — D638 Anemia in other chronic diseases classified elsewhere: Secondary | ICD-10-CM

## 2014-04-03 DIAGNOSIS — I10 Essential (primary) hypertension: Secondary | ICD-10-CM

## 2014-04-03 DIAGNOSIS — IMO0002 Reserved for concepts with insufficient information to code with codable children: Secondary | ICD-10-CM

## 2014-04-03 DIAGNOSIS — E1165 Type 2 diabetes mellitus with hyperglycemia: Secondary | ICD-10-CM

## 2014-04-03 NOTE — Progress Notes (Signed)
         PROGRESS NOTE  DATE: 04/03/2014  FACILITY: Nursing Home Location: [facility]  LEVEL OF CARE: SNF (31)  Routine Visit  CHIEF COMPLAINT:  Manage diabetes mellitus, hypertension and left humerus fracture  HISTORY OF PRESENT ILLNESS:  REASSESSMENT OF ONGOING PROBLEM(S):  DM:pt's DM is unstable.  Pt denies polyuria, polydipsia, polyphagia, changes in vision or hypoglycemic episodes.  No complications noted from the medication presently being used.  Last hemoglobin A1c is: Not available. Staff reports that he CBGs elevated in the 300 400 range.  HTN: Pt 's HTN remains stable.  Denies CP, sob, DOE, pedal edema, headaches, dizziness or visual disturbances.  No complications from the medications currently being used.  Last BP : 110/70.  HUMERUS FRACTURE: Patient fell and sustained a humeral fracture. Patient subsequently underwent surgical repair. Currently patient wears a sling. Patient is admitted to this facility for short-term rehabilitation. Patient denies current pain. No complications reported from the pain medications presently being used. he had a failed fixation. He is status post hemiarthroplasty.  PAST MEDICAL HISTORY : Reviewed.  No changes/see problem list  CURRENT MEDICATIONS: Reviewed per MAR/see medication list  REVIEW OF SYSTEMS:  GENERAL: no change in appetite, no fatigue, no weight changes, no fever, chills or weakness RESPIRATORY: no cough, SOB, DOE, wheezing, hemoptysis CARDIAC: no chest pain, edema or palpitations GI: no abdominal pain, diarrhea, constipation, heart burn, nausea or vomiting  PHYSICAL EXAMINATION  VS:  See VS section  GENERAL: no acute distress, normal body habitus EYES: conjunctivae normal, sclerae normal, normal eye lids NECK: supple, trachea midline, no neck masses, no thyroid tenderness, no thyromegaly LYMPHATICS: no LAN in the neck, no supraclavicular LAN RESPIRATORY: breathing is even & unlabored, BS  CTAB CARDIAC: RRR, no murmur,no extra heart sounds, no edema GI: abdomen soft, normal BS, no masses, no tenderness, no hepatomegaly, no splenomegaly PSYCHIATRIC: the patient is alert & oriented to person, affect & behavior appropriate  LABS/RADIOLOGY: 4-15 WBC 5.9, hemoglobin 9.3, MCV 90, platelets 487 3-15 BMP normal, total protein 5.6, albumin 3.1, alkaline phosphatase 256 otherwise liver profile normal  ASSESSMENT/PLAN:  Diabetes mellitus-uncontrolled. Increase Lantus to 15 units each bedtime. Check hemoglobin A1c. Hypertension-well controlled Left humerus fracture-status post hemiarthroplasty. Continue rehabilitation. Anemia of chronic disease-continue iron GERD-continue PPI Constipation-well controlled Thrombocytosis-acute phase reactant. Will monitor.  CPT CODE: 50539  Veleta Yamamoto Y Meshia Rau, [place] [phone]

## 2014-04-08 ENCOUNTER — Other Ambulatory Visit: Payer: Self-pay | Admitting: *Deleted

## 2014-04-08 ENCOUNTER — Non-Acute Institutional Stay (SKILLED_NURSING_FACILITY): Payer: PRIVATE HEALTH INSURANCE | Admitting: Internal Medicine

## 2014-04-08 DIAGNOSIS — E119 Type 2 diabetes mellitus without complications: Secondary | ICD-10-CM

## 2014-04-08 DIAGNOSIS — S42309A Unspecified fracture of shaft of humerus, unspecified arm, initial encounter for closed fracture: Secondary | ICD-10-CM

## 2014-04-08 DIAGNOSIS — K219 Gastro-esophageal reflux disease without esophagitis: Secondary | ICD-10-CM

## 2014-04-08 DIAGNOSIS — I10 Essential (primary) hypertension: Secondary | ICD-10-CM

## 2014-04-08 MED ORDER — OXYCODONE HCL 5 MG PO TABS: ORAL_TABLET | ORAL | Status: DC

## 2014-04-08 NOTE — Telephone Encounter (Signed)
[facility] 

## 2014-04-11 DIAGNOSIS — E119 Type 2 diabetes mellitus without complications: Secondary | ICD-10-CM | POA: Insufficient documentation

## 2014-04-11 DIAGNOSIS — K219 Gastro-esophageal reflux disease without esophagitis: Secondary | ICD-10-CM | POA: Insufficient documentation

## 2014-04-11 NOTE — Progress Notes (Signed)
Patient ID: Benjamin [name], male   DOB: December 28, 1950, 63 y.o.   MRN: [id number]                 PROGRESS NOTE  DATE: 04/08/2014     FACILITY: [facility]  LEVEL OF CARE: SNF (31)  Discharge Visit  CHIEF COMPLAINT:  Manage left humeral neck fracture.     HISTORY OF PRESENT ILLNESS: I was requested by the social worker to perform face-to-face evaluation for discharge:  Patient was admitted to this facility for short-term rehabilitation after the patient's recent hospitalization.  Patient has completed SNF rehabilitation and therapy has cleared the patient for discharge.  Reassessment of ongoing problem(s):  HUMERUS FRACTURE: Patient fell and sustained a humeral fracture. Patient subsequently underwent surgical repair. Currently patient wears a sling. Patient is admitted to this facility for short-term rehabilitation. Patient denies current pain. No complications reported from the pain medications presently being used.    PAST MEDICAL HISTORY : Reviewed.  No changes/see problem list  CURRENT MEDICATIONS: Reviewed per MAR/see medication list  REVIEW OF SYSTEMS:  GENERAL: no change in appetite, no fatigue, no weight changes, no fever, chills or weakness RESPIRATORY: no cough, SOB, DOE, wheezing, hemoptysis CARDIAC: no chest pain, edema or palpitations GI: no abdominal pain, diarrhea, constipation, heart burn, nausea or vomiting  PHYSICAL EXAMINATION  VS:  T 98.3       P 90      RR 18      BP 110/60       WT (Lb) 143       GENERAL: no acute distress, normal body habitus NECK: supple, trachea midline, no neck masses, no thyroid tenderness, no thyromegaly RESPIRATORY: breathing is even & unlabored, BS CTAB CARDIAC: RRR, no murmur,no extra heart sounds, no edema GI: abdomen soft, normal BS, no masses, no tenderness, no hepatomegaly, no splenomegaly PSYCHIATRIC: the patient is alert & oriented to person, affect & behavior appropriate  MUSCULOSKELETAL:   EXTREMITIES:     LEFT UPPER EXTREMITY: in a sling    LABS/RADIOLOGY:   03/2014:  Hemoglobin 9.3, MCV 90, platelets 487, WBC 5.9.     Glucose 289, otherwise BMP normal.     Alkaline phosphatase 256, total protein 5.6, albumin 3.1, otherwise liver profile normal.    ASSESSMENT/PLAN:  Left humerus fracture.  Continue sling.  Follow up with Orthopedic Surgery.    Diabetes mellitus.  Continue Lantus.      Hypertension.  Stable.    GERD.  Well controlled.     Anemia of chronic disease.  Continue iron.    The patient will be discharged on 04/09/2014.    I have filled out patient's discharge paperwork and written prescriptions.  Patient will receive home health PT, OT, and RN.   DME provided:  None.    Total discharge time: Less than 30 minutes Discharge time involved coordination of the discharge process with Education officer, museum, nursing staff and therapy department. Medical justification for home health services/DME verified.  CPT CODE: 37169       Gayani Y Dasanayaka, [place] [phone]

## 2014-04-18 ENCOUNTER — Emergency Department (HOSPITAL_COMMUNITY)
Admission: EM | Admit: 2014-04-18 | Discharge: 2014-04-18 | Disposition: A | Discharge status: Home / Self Care | Payer: No Typology Code available for payment source | Attending: Emergency Medicine | Admitting: Emergency Medicine

## 2014-04-18 ENCOUNTER — Emergency Department (HOSPITAL_COMMUNITY): Payer: No Typology Code available for payment source

## 2014-04-18 ENCOUNTER — Encounter (HOSPITAL_COMMUNITY): Payer: Self-pay | Admitting: Emergency Medicine

## 2014-04-18 DIAGNOSIS — Z9889 Other specified postprocedural states: Secondary | ICD-10-CM | POA: Insufficient documentation

## 2014-04-18 DIAGNOSIS — S4980XA Other specified injuries of shoulder and upper arm, unspecified arm, initial encounter: Secondary | ICD-10-CM | POA: Insufficient documentation

## 2014-04-18 DIAGNOSIS — Y929 Unspecified place or not applicable: Secondary | ICD-10-CM | POA: Insufficient documentation

## 2014-04-18 DIAGNOSIS — Y9389 Activity, other specified: Secondary | ICD-10-CM | POA: Insufficient documentation

## 2014-04-18 DIAGNOSIS — Z8719 Personal history of other diseases of the digestive system: Secondary | ICD-10-CM | POA: Insufficient documentation

## 2014-04-18 DIAGNOSIS — E119 Type 2 diabetes mellitus without complications: Secondary | ICD-10-CM | POA: Insufficient documentation

## 2014-04-18 DIAGNOSIS — Z79899 Other long term (current) drug therapy: Secondary | ICD-10-CM | POA: Insufficient documentation

## 2014-04-18 DIAGNOSIS — I10 Essential (primary) hypertension: Secondary | ICD-10-CM | POA: Insufficient documentation

## 2014-04-18 DIAGNOSIS — S46909A Unspecified injury of unspecified muscle, fascia and tendon at shoulder and upper arm level, unspecified arm, initial encounter: Secondary | ICD-10-CM | POA: Insufficient documentation

## 2014-04-18 DIAGNOSIS — Z794 Long term (current) use of insulin: Secondary | ICD-10-CM | POA: Insufficient documentation

## 2014-04-18 DIAGNOSIS — W1809XA Striking against other object with subsequent fall, initial encounter: Secondary | ICD-10-CM | POA: Insufficient documentation

## 2014-04-18 DIAGNOSIS — M25512 Pain in left shoulder: Secondary | ICD-10-CM

## 2014-04-18 DIAGNOSIS — Z87891 Personal history of nicotine dependence: Secondary | ICD-10-CM | POA: Insufficient documentation

## 2014-04-18 NOTE — Discharge Instructions (Signed)
Call Dr. Onnie Graham and make an appointment to be seen in the next week.  Shoulder Pain The shoulder is the joint that connects your arms to your body. The bones that form the shoulder joint include the upper arm bone (humerus), the shoulder blade (scapula), and the collarbone (clavicle). The top of the humerus is shaped like a ball and fits into a rather flat socket on the scapula (glenoid cavity). A combination of muscles and strong, fibrous tissues that connect muscles to bones (tendons) support your shoulder joint and hold the ball in the socket. Small, fluid-filled sacs (bursae) are located in different areas of the joint. They act as cushions between the bones and the overlying soft tissues and help reduce friction between the gliding tendons and the bone as you move your arm. Your shoulder joint allows a wide range of motion in your arm. This range of motion allows you to do things like scratch your back or throw a ball. However, this range of motion also makes your shoulder more prone to pain from overuse and injury. Causes of shoulder pain can originate from both injury and overuse and usually can be grouped in the following four categories:  Redness, swelling, and pain (inflammation) of the tendon (tendinitis) or the bursae (bursitis).  Instability, such as a dislocation of the joint.  Inflammation of the joint (arthritis).  Broken bone (fracture). HOME CARE INSTRUCTIONS   Apply ice to the sore area.  Put ice in a plastic bag.  Place a towel between your skin and the bag.  Leave the ice on for 15-20 minutes, 03-04 times per day for the first 2 days.  Stop using cold packs if they do not help with the pain.  If you have a shoulder sling or immobilizer, wear it as long as your caregiver instructs. Only remove it to shower or bathe. Move your arm as little as possible, but keep your hand moving to prevent swelling.  Squeeze a soft ball or foam pad as much as possible to help prevent  swelling.  Only take over-the-counter or prescription medicines for pain, discomfort, or fever as directed by your caregiver. SEEK MEDICAL CARE IF:   Your shoulder pain increases, or new pain develops in your arm, hand, or fingers.  Your hand or fingers become cold and numb.  Your pain is not relieved with medicines. SEEK IMMEDIATE MEDICAL CARE IF:   Your arm, hand, or fingers are numb or tingling.  Your arm, hand, or fingers are significantly swollen or turn white or blue. MAKE SURE YOU:   Understand these instructions.  Will watch your condition.  Will get help right away if you are not doing well or get worse. Document Released: 09/01/2005 Document Revised: 08/16/2012 Document Reviewed: 11/06/2011 [facility] Patient Information 2014 [place].

## 2014-04-18 NOTE — ED Notes (Signed)
Patient transported to X-ray 

## 2014-04-18 NOTE — ED Provider Notes (Signed)
CSN: [id number]     Arrival date & time 04/18/14  0359 History   First MD Initiated Contact with Patient 04/18/14 [phone]     Chief Complaint  Patient presents with  . Fall  . Shoulder Pain     (Consider location/radiation/quality/duration/timing/severity/associated sxs/prior Treatment) HPI Patient with left shoulder surgery in February and revision 10 days ago. States he lost his balance and struck his left shoulder in the doorway. He denies any head or neck trauma. He complains of pain to the left shoulder. There is no swelling or bruising. He has no numbness or weakness. Past Medical History  Diagnosis Date  . Diabetes mellitus   . Hypertension   . Alcohol abuse   . Pancreatitis    Past Surgical History  Procedure Laterality Date  . Knee surgery    . Pancreas surgery    . Esophagogastroduodenoscopy N/A 06/19/2013    Procedure: ESOPHAGOGASTRODUODENOSCOPY (EGD);  Surgeon: Cleotis Nipper, MD;  Location: [facility]COPY;  Service: Endoscopy;  Laterality: N/A;  . Orif humerus fracture Left 02/05/2014    Procedure: OPEN REDUCTION INTERNAL FIXATION (ORIF) LEFT  PROXIMAL HUMERUS FRACTURE;  Surgeon: Marin Shutter, MD;  Location: [facility];  Service: Orthopedics;  Laterality: Left;  . Humeral hemiarthroplasty Left 02/28/2014    DR SUPPLE  . Shoulder hemi-arthroplasty Left 02/28/2014    Procedure: HEMI-ARTHROPLASTY ;  Surgeon: Marin Shutter, MD;  Location: [place];  Service: Orthopedics;  Laterality: Left;   Family History  Problem Relation Age of Onset  . Diabetes Mother   . Dementia Father    History  Substance Use Topics  . Smoking status: Former Smoker -- 1.00 packs/day for 40 years    Types: Cigarettes    Quit date: 08/16/2013  . Smokeless tobacco: Never Used  . Alcohol Use: Yes     Comment: 4-5 glasses of wine a day    Review of Systems  Constitutional: Negative for fever and chills.  Respiratory: Negative for shortness of breath.   Cardiovascular: Negative for chest pain.   Gastrointestinal: Negative for nausea, vomiting, abdominal pain and diarrhea.  Musculoskeletal: Positive for arthralgias. Negative for neck pain and neck stiffness.  Skin: Negative for rash.  Neurological: Negative for dizziness, weakness, light-headedness, numbness and headaches.  All other systems reviewed and are negative.     Allergies  Tetanus toxoids  Home Medications   Prior to Admission medications   Medication Sig Start Date End Date Taking? Authorizing Provider  amLODipine (NORVASC) 5 MG tablet Take 10 mg by mouth daily. Hold if SBP < 100    Historical Provider, MD  bisacodyl (DULCOLAX) 5 MG EC tablet Take 1 tablet (5 mg total) by mouth daily as needed for moderate constipation. 03/01/14   [name] Shuford, PA-C  cloNIDine (CATAPRES) 0.1 MG tablet Take 0.1 mg by mouth 2 (two) times daily. Hold if SBP < 100    Historical Provider, MD  diazepam (VALIUM) 5 MG tablet Take 0.5-1 tablets (2.5-5 mg total) by mouth every 6 (six) hours as needed for muscle spasms or sedation. 03/01/14   [name] Shuford, PA-C  docusate sodium 100 MG CAPS Take 100 mg by mouth 2 (two) times daily. 02/11/14   Hosie Poisson, MD  ferrous sulfate 325 (65 FE) MG tablet Take 1 tablet (325 mg total) by mouth 3 (three) times daily with meals. 02/11/14   Hosie Poisson, MD  folic acid (FOLVITE) 1 MG tablet Take 1 mg by mouth daily.    Historical Provider, MD  furosemide (  LASIX) 20 MG tablet Take 20 mg by mouth 2 (two) times daily.    Historical Provider, MD  insulin glargine (LANTUS) 100 UNIT/ML injection Inject 10 Units into the skin at bedtime.    Historical Provider, MD  insulin lispro (HUMALOG) 100 UNIT/ML injection Inject 2-8 Units into the skin 3 (three) times daily before meals. If cbg 201-250= 2 units; 251-300= 4 units, 301-350= 6 units, 351-400 = 8 units, over 400 = call MD    Historical Provider, MD  lactose free nutrition (BOOST) LIQD Take 237 mLs by mouth 3 (three) times daily between meals.    Historical Provider, MD   lisinopril (PRINIVIL,ZESTRIL) 20 MG tablet Take 20 mg by mouth daily. Hold if sbp < 100    Historical Provider, MD  Multiple Vitamin (MULTIVITAMIN WITH MINERALS) TABS Take 1 tablet by mouth daily. 07/11/12   Thurnell Lose, MD  oxyCODONE (ROXICODONE) 5 MG immediate release tablet Take two tablets by mouth every 3 hours as needed for moderate pain; Take three tablets by mouth every 3 hours as needed for severe pain 04/08/14   Tiffany L Reed, DO  oxyCODONE 10 MG TABS Take 1-1.5 tablets (10-15 mg total) by mouth every 3 (three) hours as needed for severe pain. 03/01/14   [name] Shuford, PA-C  pantoprazole (PROTONIX) 40 MG tablet Take 1 tablet (40 mg total) by mouth daily. 02/11/14   Hosie Poisson, MD  polyethylene glycol (MIRALAX / GLYCOLAX) packet Take 17 g by mouth daily as needed for mild constipation. 02/11/14   Hosie Poisson, MD  thiamine 100 MG tablet Take 1 tablet (100 mg total) by mouth daily. 06/21/13   Ripudeep Krystal Eaton, MD  vitamin C (ASCORBIC ACID) 500 MG tablet Take 500 mg by mouth 2 (two) times daily.    Historical Provider, MD  zinc sulfate 220 MG capsule Take 220 mg by mouth daily.    Historical Provider, MD  zolpidem (AMBIEN) 5 MG tablet Take 1 tablet (5 mg total) by mouth at bedtime as needed for sleep. 03/01/14   Arlo C Lassen, PA-C   BP 122/72  Pulse 73  Temp(Src) 98.1 F (36.7 C) (Oral)  Resp 12  SpO2 100% Physical Exam  Nursing note and vitals reviewed. Constitutional: He is oriented to person, place, and time. He appears well-developed and well-nourished. No distress.  HENT:  Head: Normocephalic and atraumatic.  Mouth/Throat: Oropharynx is clear and moist.  Eyes: EOM are normal. Pupils are equal, round, and reactive to light.  Neck: Normal range of motion. Neck supple.  No posterior midline cervical tenderness to palpation.  Cardiovascular: Normal rate and regular rhythm.   Pulmonary/Chest: Effort normal and breath sounds normal. No respiratory distress. He has no wheezes. He has no  rales. He exhibits no tenderness.  Abdominal: Soft. Bowel sounds are normal. He exhibits no distension and no mass. There is no tenderness. There is no rebound and no guarding.  Musculoskeletal: Normal range of motion. He exhibits tenderness. He exhibits no edema.  To palpation over the lateral deltoid and anterior deltoid. Decreased range of motion of the left shoulder due to pain. No obvious deformity. Well-healing scar. Distal pulses intact.  Neurological: He is alert and oriented to person, place, and time.  5/5 grip strength bilaterally. Sensation grossly intact.  Skin: Skin is warm and dry. No rash noted. No erythema.  Psychiatric: He has a normal mood and affect. His behavior is normal.    ED Course  Procedures (including critical care time) Labs Review Labs  Reviewed - No data to display  Imaging Review Dg Shoulder Left  04/18/2014   CLINICAL DATA:  Anterior left shoulder pain after a fall.  EXAM: LEFT SHOULDER - 2+ VIEW  COMPARISON:  DG SHOULDER *L* dated 02/05/2014; DG SHOULDER*L*PORT dated 01/28/2014  FINDINGS: Since the prior study, there has been interval placement of a left shoulder arthroplasty. There is some bone fragmentation around the arthroplasty components which probably represents residual fracture fragments but without the benefit of comparison studies, acute fracture or loosening is not entirely excluded. There is no dislocation of the shoulder. Proximal portion of the component appears well seated.  IMPRESSION: Postoperative change in the left shoulder with displaced fragments, likely chronic but without the benefit of postoperative comparison studies, loosening or settling cannot be excluded.   Electronically Signed   By: Lucienne Capers M.D.   On: 04/18/2014 05:49     EKG Interpretation None      MDM   Final diagnoses:  Left shoulder pain     Discussed with Dr.Olin. Agrees with plan to discharge home and followup and an outpatient with Dr. Ines Bloomer, MD 04/18/14 [phone]

## 2014-04-18 NOTE — ED Notes (Signed)
Pt from home was up ambulating to the bathroom with walker and lost balance. Pt states he hit shoulder in door way; denies LOC; no other pain; pt has hx of surgery on left shoulder Feb. 13. Pt c/o left shoulder pain.

## 2014-05-01 ENCOUNTER — Inpatient Hospital Stay (HOSPITAL_COMMUNITY): Payer: No Typology Code available for payment source

## 2014-05-01 ENCOUNTER — Encounter (HOSPITAL_COMMUNITY): Payer: No Typology Code available for payment source | Admitting: Anesthesiology

## 2014-05-01 ENCOUNTER — Emergency Department (HOSPITAL_COMMUNITY): Payer: No Typology Code available for payment source

## 2014-05-01 ENCOUNTER — Inpatient Hospital Stay (HOSPITAL_COMMUNITY): Payer: No Typology Code available for payment source | Admitting: Anesthesiology

## 2014-05-01 ENCOUNTER — Encounter (HOSPITAL_COMMUNITY)
Admission: EM | Disposition: A | Discharge status: Skilled Nursing Facility | Payer: Self-pay | Source: Home / Self Care | Attending: Family Medicine

## 2014-05-01 ENCOUNTER — Inpatient Hospital Stay (HOSPITAL_COMMUNITY)
Admission: EM | Admit: 2014-05-01 | Discharge: 2014-05-03 | DRG: 481 | Disposition: A | Discharge status: Skilled Nursing Facility | Payer: No Typology Code available for payment source | Attending: Family Medicine | Admitting: Family Medicine

## 2014-05-01 ENCOUNTER — Encounter (HOSPITAL_COMMUNITY): Payer: Self-pay | Admitting: Emergency Medicine

## 2014-05-01 DIAGNOSIS — IMO0002 Reserved for concepts with insufficient information to code with codable children: Secondary | ICD-10-CM

## 2014-05-01 DIAGNOSIS — K279 Peptic ulcer, site unspecified, unspecified as acute or chronic, without hemorrhage or perforation: Secondary | ICD-10-CM

## 2014-05-01 DIAGNOSIS — Y92009 Unspecified place in unspecified non-institutional (private) residence as the place of occurrence of the external cause: Secondary | ICD-10-CM

## 2014-05-01 DIAGNOSIS — G9340 Encephalopathy, unspecified: Secondary | ICD-10-CM

## 2014-05-01 DIAGNOSIS — E118 Type 2 diabetes mellitus with unspecified complications: Secondary | ICD-10-CM

## 2014-05-01 DIAGNOSIS — M545 Low back pain, unspecified: Secondary | ICD-10-CM

## 2014-05-01 DIAGNOSIS — E1165 Type 2 diabetes mellitus with hyperglycemia: Secondary | ICD-10-CM | POA: Diagnosis present

## 2014-05-01 DIAGNOSIS — Z79899 Other long term (current) drug therapy: Secondary | ICD-10-CM

## 2014-05-01 DIAGNOSIS — Z87891 Personal history of nicotine dependence: Secondary | ICD-10-CM

## 2014-05-01 DIAGNOSIS — E119 Type 2 diabetes mellitus without complications: Secondary | ICD-10-CM

## 2014-05-01 DIAGNOSIS — W19XXXA Unspecified fall, initial encounter: Secondary | ICD-10-CM

## 2014-05-01 DIAGNOSIS — D62 Acute posthemorrhagic anemia: Secondary | ICD-10-CM | POA: Diagnosis not present

## 2014-05-01 DIAGNOSIS — K922 Gastrointestinal hemorrhage, unspecified: Secondary | ICD-10-CM

## 2014-05-01 DIAGNOSIS — I10 Essential (primary) hypertension: Secondary | ICD-10-CM

## 2014-05-01 DIAGNOSIS — E872 Acidosis, unspecified: Secondary | ICD-10-CM

## 2014-05-01 DIAGNOSIS — S72141A Displaced intertrochanteric fracture of right femur, initial encounter for closed fracture: Secondary | ICD-10-CM

## 2014-05-01 DIAGNOSIS — W010XXA Fall on same level from slipping, tripping and stumbling without subsequent striking against object, initial encounter: Secondary | ICD-10-CM | POA: Diagnosis present

## 2014-05-01 DIAGNOSIS — K5900 Constipation, unspecified: Secondary | ICD-10-CM

## 2014-05-01 DIAGNOSIS — R609 Edema, unspecified: Secondary | ICD-10-CM

## 2014-05-01 DIAGNOSIS — F1010 Alcohol abuse, uncomplicated: Secondary | ICD-10-CM

## 2014-05-01 DIAGNOSIS — E440 Moderate protein-calorie malnutrition: Secondary | ICD-10-CM

## 2014-05-01 DIAGNOSIS — E876 Hypokalemia: Secondary | ICD-10-CM

## 2014-05-01 DIAGNOSIS — Z96619 Presence of unspecified artificial shoulder joint: Secondary | ICD-10-CM

## 2014-05-01 DIAGNOSIS — S42309A Unspecified fracture of shaft of humerus, unspecified arm, initial encounter for closed fracture: Secondary | ICD-10-CM

## 2014-05-01 DIAGNOSIS — Z833 Family history of diabetes mellitus: Secondary | ICD-10-CM

## 2014-05-01 DIAGNOSIS — D649 Anemia, unspecified: Secondary | ICD-10-CM

## 2014-05-01 DIAGNOSIS — Z794 Long term (current) use of insulin: Secondary | ICD-10-CM

## 2014-05-01 DIAGNOSIS — N390 Urinary tract infection, site not specified: Secondary | ICD-10-CM

## 2014-05-01 DIAGNOSIS — M6282 Rhabdomyolysis: Secondary | ICD-10-CM

## 2014-05-01 DIAGNOSIS — E1110 Type 2 diabetes mellitus with ketoacidosis without coma: Secondary | ICD-10-CM

## 2014-05-01 DIAGNOSIS — K859 Acute pancreatitis without necrosis or infection, unspecified: Secondary | ICD-10-CM

## 2014-05-01 DIAGNOSIS — J690 Pneumonitis due to inhalation of food and vomit: Secondary | ICD-10-CM

## 2014-05-01 DIAGNOSIS — K219 Gastro-esophageal reflux disease without esophagitis: Secondary | ICD-10-CM

## 2014-05-01 DIAGNOSIS — S72143A Displaced intertrochanteric fracture of unspecified femur, initial encounter for closed fracture: Principal | ICD-10-CM

## 2014-05-01 DIAGNOSIS — E871 Hypo-osmolality and hyponatremia: Secondary | ICD-10-CM

## 2014-05-01 HISTORY — PX: FEMUR IM NAIL: SHX1597

## 2014-05-01 LAB — BASIC METABOLIC PANEL
BUN: 8 mg/dL (ref 6–23)
CALCIUM: 8.7 mg/dL (ref 8.4–10.5)
CO2: 23 mEq/L (ref 19–32)
Chloride: 93 mEq/L — ABNORMAL LOW (ref 96–112)
Creatinine, Ser: 0.63 mg/dL (ref 0.50–1.35)
GFR calc Af Amer: >90 mL/min (ref >90)
GLUCOSE: 244 mg/dL — AB (ref 70–99)
Potassium: 4 mEq/L (ref 3.7–5.3)
SODIUM: 136 meq/L — AB (ref 137–147)

## 2014-05-01 LAB — CBC WITH DIFFERENTIAL/PLATELET
BASOS ABS: 0.1 10*3/uL (ref 0.0–0.1)
BASOS PCT: 1 % (ref 0–1)
EOS ABS: 0 10*3/uL (ref 0.0–0.7)
EOS PCT: 0 % (ref 0–5)
HCT: 37.3 % — ABNORMAL LOW (ref 39.0–52.0)
Hemoglobin: 12.8 g/dL — ABNORMAL LOW (ref 13.0–17.0)
Lymphocytes Relative: 17 % (ref 12–46)
Lymphs Abs: 1.6 10*3/uL (ref 0.7–4.0)
MCH: 29.3 pg (ref 26.0–34.0)
MCHC: 34.3 g/dL (ref 30.0–36.0)
MCV: 85.4 fL (ref 78.0–100.0)
Monocytes Absolute: 0.3 10*3/uL (ref 0.1–1.0)
Monocytes Relative: 4 % (ref 3–12)
Neutro Abs: 7.2 10*3/uL (ref 1.7–7.7)
Neutrophils Relative %: 78 % — ABNORMAL HIGH (ref 43–77)
PLATELETS: 484 10*3/uL — AB (ref 150–400)
RBC: 4.37 MIL/uL (ref 4.22–5.81)
RDW: 16.9 % — AB (ref 11.5–15.5)
WBC: 9.2 10*3/uL (ref 4.0–10.5)

## 2014-05-01 LAB — URINALYSIS, ROUTINE W REFLEX MICROSCOPIC
BILIRUBIN URINE: NEGATIVE
Glucose, UA: NEGATIVE mg/dL
Hgb urine dipstick: NEGATIVE
Ketones, ur: NEGATIVE mg/dL
LEUKOCYTES UA: NEGATIVE
NITRITE: NEGATIVE
PH: 5 (ref 5.0–8.0)
Protein, ur: NEGATIVE mg/dL
Specific Gravity, Urine: 1.004 — ABNORMAL LOW (ref 1.005–1.030)
UROBILINOGEN UA: 0.2 mg/dL (ref 0.0–1.0)

## 2014-05-01 LAB — HEPATIC FUNCTION PANEL
ALT: 44 U/L (ref 0–53)
AST: 31 U/L (ref 0–37)
Albumin: 2.7 g/dL — ABNORMAL LOW (ref 3.5–5.2)
Alkaline Phosphatase: 280 U/L — ABNORMAL HIGH (ref 39–117)
Bilirubin, Direct: <0.2 mg/dL (ref 0.0–0.3)
Total Bilirubin: 0.4 mg/dL (ref 0.3–1.2)
Total Protein: 6 g/dL (ref 6.0–8.3)

## 2014-05-01 LAB — SURGICAL PCR SCREEN
MRSA, PCR: NEGATIVE
STAPHYLOCOCCUS AUREUS: NEGATIVE

## 2014-05-01 LAB — CREATININE, SERUM
CREATININE: 0.61 mg/dL (ref 0.50–1.35)
GFR calc Af Amer: >90 mL/min (ref >90)
GFR calc non Af Amer: >90 mL/min (ref >90)

## 2014-05-01 LAB — PROTIME-INR
INR: 1.03 (ref 0.00–1.49)
PROTHROMBIN TIME: 13.3 s (ref 11.6–15.2)

## 2014-05-01 LAB — RAPID URINE DRUG SCREEN, HOSP PERFORMED
Amphetamines: NOT DETECTED
BARBITURATES: NOT DETECTED
Benzodiazepines: POSITIVE — AB
COCAINE: NOT DETECTED
OPIATES: POSITIVE — AB
Tetrahydrocannabinol: NOT DETECTED

## 2014-05-01 LAB — GLUCOSE, CAPILLARY
GLUCOSE-CAPILLARY: 214 mg/dL — AB (ref 70–99)
GLUCOSE-CAPILLARY: 181 mg/dL — AB (ref 70–99)

## 2014-05-01 LAB — ETHANOL: ALCOHOL ETHYL (B): 118 mg/dL — AB (ref 0–11)

## 2014-05-01 SURGERY — INSERTION, INTRAMEDULLARY ROD, FEMUR
Anesthesia: General | Site: Hip | Laterality: Right

## 2014-05-01 MED ORDER — FERROUS SULFATE 325 (65 FE) MG PO TABS
325.0000 mg | ORAL_TABLET | Freq: Every day | ORAL | Status: DC
  Administered 2014-05-02 – 2014-05-03 (×2): 325 mg via ORAL
  Filled 2014-05-01 (×3): qty 1

## 2014-05-01 MED ORDER — SODIUM CHLORIDE 0.9 % IV BOLUS (SEPSIS)
1000.0000 mL | Freq: Once | INTRAVENOUS | Status: AC
  Administered 2014-05-01: 1000 mL via INTRAVENOUS

## 2014-05-01 MED ORDER — DOCUSATE SODIUM 100 MG PO CAPS: 100.0000 mg | ORAL_CAPSULE | Freq: Two times a day (BID) | ORAL | Status: AC | PRN

## 2014-05-01 MED ORDER — MIDAZOLAM HCL 2 MG/2ML IJ SOLN
INTRAMUSCULAR | Status: AC
  Filled 2014-05-01: qty 2

## 2014-05-01 MED ORDER — MORPHINE SULFATE 4 MG/ML IJ SOLN
4.0000 mg | Freq: Once | INTRAMUSCULAR | Status: AC
  Administered 2014-05-01: 4 mg via INTRAVENOUS
  Filled 2014-05-01: qty 1

## 2014-05-01 MED ORDER — VITAMIN B-1 100 MG PO TABS: 100.0000 mg | ORAL_TABLET | Freq: Every day | ORAL | Status: DC

## 2014-05-01 MED ORDER — METHOCARBAMOL 500 MG PO TABS
500.0000 mg | ORAL_TABLET | Freq: Four times a day (QID) | ORAL | Status: DC | PRN
  Administered 2014-05-02 – 2014-05-03 (×3): 500 mg via ORAL
  Filled 2014-05-01 (×3): qty 1

## 2014-05-01 MED ORDER — FENTANYL CITRATE 0.05 MG/ML IJ SOLN
INTRAMUSCULAR | Status: AC
  Filled 2014-05-01: qty 2

## 2014-05-01 MED ORDER — ONDANSETRON HCL 4 MG/2ML IJ SOLN
INTRAMUSCULAR | Status: DC | PRN
  Administered 2014-05-01: 4 mg via INTRAVENOUS

## 2014-05-01 MED ORDER — FUROSEMIDE 20 MG PO TABS
20.0000 mg | ORAL_TABLET | Freq: Every day | ORAL | Status: DC
  Administered 2014-05-02 – 2014-05-03 (×2): 20 mg via ORAL
  Filled 2014-05-01 (×2): qty 1

## 2014-05-01 MED ORDER — PHENYLEPHRINE HCL 10 MG/ML IJ SOLN
20.0000 mg | INTRAVENOUS | Status: DC | PRN
  Administered 2014-05-01: 10 ug/min via INTRAVENOUS

## 2014-05-01 MED ORDER — FOLIC ACID 1 MG PO TABS: 1.0000 mg | ORAL_TABLET | Freq: Every day | ORAL | Status: DC

## 2014-05-01 MED ORDER — SODIUM CHLORIDE 0.9 % IV SOLN
INTRAVENOUS | Status: DC
  Administered 2014-05-01 – 2014-05-03 (×3): via INTRAVENOUS

## 2014-05-01 MED ORDER — MORPHINE SULFATE 2 MG/ML IJ SOLN: 0.5000 mg | INTRAMUSCULAR | Status: DC | PRN

## 2014-05-01 MED ORDER — GLYCOPYRROLATE 0.2 MG/ML IJ SOLN
INTRAMUSCULAR | Status: DC | PRN
  Administered 2014-05-01: 0.4 mg via INTRAVENOUS

## 2014-05-01 MED ORDER — LORAZEPAM 2 MG/ML IJ SOLN
1.0000 mg | Freq: Once | INTRAMUSCULAR | Status: AC
  Administered 2014-05-01: 1 mg via INTRAVENOUS
  Filled 2014-05-01: qty 1

## 2014-05-01 MED ORDER — OXYCODONE HCL 5 MG PO TABS: 5.0000 mg | ORAL_TABLET | ORAL | Status: DC | PRN

## 2014-05-01 MED ORDER — HEPARIN SODIUM (PORCINE) 5000 UNIT/ML IJ SOLN: 5000.0000 [IU] | Freq: Three times a day (TID) | INTRAMUSCULAR | Status: DC

## 2014-05-01 MED ORDER — INSULIN ASPART 100 UNIT/ML ~~LOC~~ SOLN: 0.0000 [IU] | Freq: Three times a day (TID) | SUBCUTANEOUS | Status: DC

## 2014-05-01 MED ORDER — VITAMIN B-1 100 MG PO TABS
100.0000 mg | ORAL_TABLET | Freq: Every day | ORAL | Status: DC
  Administered 2014-05-02 – 2014-05-03 (×2): 100 mg via ORAL
  Filled 2014-05-01 (×3): qty 1

## 2014-05-01 MED ORDER — SODIUM CHLORIDE 0.9 % IV SOLN: INTRAVENOUS | Status: DC

## 2014-05-01 MED ORDER — CLINDAMYCIN PHOSPHATE 600 MG/50ML IV SOLN
600.0000 mg | INTRAVENOUS | Status: AC
  Administered 2014-05-01: 900 mg via INTRAVENOUS

## 2014-05-01 MED ORDER — ACETAMINOPHEN 650 MG RE SUPP: 650.0000 mg | Freq: Four times a day (QID) | RECTAL | Status: DC | PRN

## 2014-05-01 MED ORDER — PHENOL 1.4 % MT LIQD
1.0000 | OROMUCOSAL | Status: DC | PRN
  Filled 2014-05-01: qty 177

## 2014-05-01 MED ORDER — ONDANSETRON HCL 4 MG PO TABS
4.0000 mg | ORAL_TABLET | Freq: Four times a day (QID) | ORAL | Status: DC | PRN
Start: 2014-05-01 — End: 2014-05-03
  Administered 2014-05-02: 4 mg via ORAL
  Filled 2014-05-01: qty 1

## 2014-05-01 MED ORDER — EPHEDRINE SULFATE 50 MG/ML IJ SOLN
INTRAMUSCULAR | Status: DC | PRN
  Administered 2014-05-01 (×2): 10 mg via INTRAVENOUS

## 2014-05-01 MED ORDER — PROPOFOL 10 MG/ML IV BOLUS
INTRAVENOUS | Status: DC | PRN
Start: 2014-05-01 — End: 2014-05-01
  Administered 2014-05-01: 150 mg via INTRAVENOUS

## 2014-05-01 MED ORDER — HYDROMORPHONE HCL PF 1 MG/ML IJ SOLN
INTRAMUSCULAR | Status: AC
  Filled 2014-05-01: qty 1

## 2014-05-01 MED ORDER — ROCURONIUM BROMIDE 100 MG/10ML IV SOLN
INTRAVENOUS | Status: DC | PRN
  Administered 2014-05-01: 20 mg via INTRAVENOUS

## 2014-05-01 MED ORDER — HYDROCODONE-ACETAMINOPHEN 5-325 MG PO TABS
1.0000 | ORAL_TABLET | Freq: Four times a day (QID) | ORAL | Status: DC | PRN
  Administered 2014-05-02 – 2014-05-03 (×7): 2 via ORAL
  Filled 2014-05-01 (×8): qty 2

## 2014-05-01 MED ORDER — ACETAMINOPHEN 325 MG PO TABS
650.0000 mg | ORAL_TABLET | Freq: Four times a day (QID) | ORAL | Status: DC | PRN
Start: 2014-05-01 — End: 2014-05-03

## 2014-05-01 MED ORDER — METHOCARBAMOL 1000 MG/10ML IJ SOLN
500.0000 mg | Freq: Four times a day (QID) | INTRAVENOUS | Status: DC | PRN
  Administered 2014-05-01 – 2014-05-02 (×2): 500 mg via INTRAVENOUS
  Filled 2014-05-01 (×2): qty 5

## 2014-05-01 MED ORDER — BOOST PO LIQD
237.0000 mL | Freq: Three times a day (TID) | ORAL | Status: DC
  Administered 2014-05-02 – 2014-05-03 (×4): 237 mL via ORAL
  Filled 2014-05-01 (×6): qty 237

## 2014-05-01 MED ORDER — LIDOCAINE HCL (CARDIAC) 20 MG/ML IV SOLN
INTRAVENOUS | Status: DC | PRN
  Administered 2014-05-01: 80 mg via INTRAVENOUS

## 2014-05-01 MED ORDER — SUCCINYLCHOLINE CHLORIDE 20 MG/ML IJ SOLN
INTRAMUSCULAR | Status: DC | PRN
  Administered 2014-05-01: 100 mg via INTRAVENOUS

## 2014-05-01 MED ORDER — ACETAMINOPHEN 500 MG PO TABS: 500.0000 mg | ORAL_TABLET | Freq: Four times a day (QID) | ORAL | Status: DC | PRN

## 2014-05-01 MED ORDER — METOCLOPRAMIDE HCL 5 MG/ML IJ SOLN: 5.0000 mg | Freq: Three times a day (TID) | INTRAMUSCULAR | Status: DC | PRN

## 2014-05-01 MED ORDER — CYANOCOBALAMIN 250 MCG PO TABS
250.0000 ug | ORAL_TABLET | Freq: Every day | ORAL | Status: DC
  Administered 2014-05-02 – 2014-05-03 (×2): 250 ug via ORAL
  Filled 2014-05-01 (×3): qty 1

## 2014-05-01 MED ORDER — CEFAZOLIN SODIUM-DEXTROSE 2-3 GM-% IV SOLR
2.0000 g | INTRAVENOUS | Status: AC
  Administered 2014-05-01: 2 g via INTRAVENOUS

## 2014-05-01 MED ORDER — EPHEDRINE SULFATE 50 MG/ML IJ SOLN
INTRAMUSCULAR | Status: AC
  Filled 2014-05-01: qty 1

## 2014-05-01 MED ORDER — HYDROMORPHONE HCL PF 1 MG/ML IJ SOLN
0.2500 mg | INTRAMUSCULAR | Status: DC | PRN
  Administered 2014-05-01 – 2014-05-03 (×6): 0.5 mg via INTRAVENOUS
  Filled 2014-05-01 (×2): qty 1

## 2014-05-01 MED ORDER — PHENYLEPHRINE HCL 10 MG/ML IJ SOLN
INTRAMUSCULAR | Status: DC | PRN
  Administered 2014-05-01 (×2): 80 ug via INTRAVENOUS

## 2014-05-01 MED ORDER — CLINDAMYCIN PHOSPHATE 900 MG/50ML IV SOLN
900.0000 mg | Freq: Once | INTRAVENOUS | Status: DC
  Filled 2014-05-01: qty 50

## 2014-05-01 MED ORDER — PROPOFOL 10 MG/ML IV BOLUS
INTRAVENOUS | Status: AC
Start: 2014-05-01 — End: 2014-05-01
  Filled 2014-05-01: qty 20

## 2014-05-01 MED ORDER — LACTATED RINGERS IV SOLN
INTRAVENOUS | Status: DC | PRN
  Administered 2014-05-01: 19:00:00 via INTRAVENOUS

## 2014-05-01 MED ORDER — HYDROMORPHONE HCL PF 1 MG/ML IJ SOLN
1.0000 mg | INTRAMUSCULAR | Status: DC | PRN
  Administered 2014-05-01 (×2): 1 mg via INTRAVENOUS
  Filled 2014-05-01 (×2): qty 1

## 2014-05-01 MED ORDER — MIDAZOLAM HCL 5 MG/5ML IJ SOLN
INTRAMUSCULAR | Status: DC | PRN
  Administered 2014-05-01: 1 mg via INTRAVENOUS

## 2014-05-01 MED ORDER — 0.9 % SODIUM CHLORIDE (POUR BTL) OPTIME
TOPICAL | Status: DC | PRN
  Administered 2014-05-01: 1000 mL

## 2014-05-01 MED ORDER — SODIUM CHLORIDE 0.9 % IJ SOLN
3.0000 mL | Freq: Two times a day (BID) | INTRAMUSCULAR | Status: DC
  Administered 2014-05-02: 3 mL via INTRAVENOUS

## 2014-05-01 MED ORDER — ONDANSETRON HCL 4 MG/2ML IJ SOLN
4.0000 mg | Freq: Three times a day (TID) | INTRAMUSCULAR | Status: DC | PRN
  Administered 2014-05-01: 4 mg via INTRAVENOUS
  Filled 2014-05-01: qty 2

## 2014-05-01 MED ORDER — SODIUM CHLORIDE 0.9 % IJ SOLN
INTRAMUSCULAR | Status: AC
  Filled 2014-05-01: qty 10

## 2014-05-01 MED ORDER — CEFAZOLIN SODIUM-DEXTROSE 2-3 GM-% IV SOLR
INTRAVENOUS | Status: AC
  Filled 2014-05-01: qty 50

## 2014-05-01 MED ORDER — INSULIN ASPART 100 UNIT/ML ~~LOC~~ SOLN
0.0000 [IU] | Freq: Three times a day (TID) | SUBCUTANEOUS | Status: DC
  Administered 2014-05-02: 1 [IU] via SUBCUTANEOUS
  Administered 2014-05-02: 2 [IU] via SUBCUTANEOUS
  Administered 2014-05-02: 5 [IU] via SUBCUTANEOUS
  Administered 2014-05-03: 2 [IU] via SUBCUTANEOUS

## 2014-05-01 MED ORDER — LIDOCAINE HCL (CARDIAC) 20 MG/ML IV SOLN
INTRAVENOUS | Status: AC
  Filled 2014-05-01: qty 5

## 2014-05-01 MED ORDER — CLONIDINE HCL 0.1 MG PO TABS
0.1000 mg | ORAL_TABLET | Freq: Three times a day (TID) | ORAL | Status: DC
  Administered 2014-05-02 – 2014-05-03 (×4): 0.1 mg via ORAL
  Filled 2014-05-01 (×7): qty 1

## 2014-05-01 MED ORDER — PHENYLEPHRINE 40 MCG/ML (10ML) SYRINGE FOR IV PUSH (FOR BLOOD PRESSURE SUPPORT)
PREFILLED_SYRINGE | INTRAVENOUS | Status: AC
  Filled 2014-05-01: qty 10

## 2014-05-01 MED ORDER — FENTANYL CITRATE 0.05 MG/ML IJ SOLN
INTRAMUSCULAR | Status: DC | PRN
  Administered 2014-05-01: 50 ug via INTRAVENOUS

## 2014-05-01 MED ORDER — HYDROMORPHONE HCL PF 1 MG/ML IJ SOLN: 0.5000 mg | INTRAMUSCULAR | Status: DC | PRN

## 2014-05-01 MED ORDER — METOCLOPRAMIDE HCL 10 MG PO TABS: 5.0000 mg | ORAL_TABLET | Freq: Three times a day (TID) | ORAL | Status: DC | PRN

## 2014-05-01 MED ORDER — PHENYLEPHRINE HCL 10 MG/ML IJ SOLN
INTRAMUSCULAR | Status: AC
  Filled 2014-05-01: qty 2

## 2014-05-01 MED ORDER — ONDANSETRON HCL 4 MG/2ML IJ SOLN
INTRAMUSCULAR | Status: AC
  Filled 2014-05-01: qty 2

## 2014-05-01 MED ORDER — NEOSTIGMINE METHYLSULFATE 10 MG/10ML IV SOLN
INTRAVENOUS | Status: DC | PRN
  Administered 2014-05-01: 3 mg via INTRAVENOUS

## 2014-05-01 MED ORDER — NEOSTIGMINE METHYLSULFATE 10 MG/10ML IV SOLN
INTRAVENOUS | Status: AC
  Filled 2014-05-01: qty 1

## 2014-05-01 MED ORDER — VITAMIN C 500 MG PO TABS: 500.0000 mg | ORAL_TABLET | Freq: Every day | ORAL | Status: DC

## 2014-05-01 MED ORDER — GLYCOPYRROLATE 0.2 MG/ML IJ SOLN
INTRAMUSCULAR | Status: AC
  Filled 2014-05-01: qty 2

## 2014-05-01 MED ORDER — ADULT MULTIVITAMIN W/MINERALS CH
1.0000 | ORAL_TABLET | Freq: Every day | ORAL | Status: DC
  Administered 2014-05-02 – 2014-05-03 (×2): 1 via ORAL
  Filled 2014-05-01 (×2): qty 1

## 2014-05-01 MED ORDER — FOLIC ACID 1 MG PO TABS
1.0000 mg | ORAL_TABLET | Freq: Every day | ORAL | Status: DC
  Administered 2014-05-02 – 2014-05-03 (×2): 1 mg via ORAL
  Filled 2014-05-01 (×3): qty 1

## 2014-05-01 MED ORDER — ENOXAPARIN SODIUM 30 MG/0.3ML ~~LOC~~ SOLN
30.0000 mg | SUBCUTANEOUS | Status: DC
  Administered 2014-05-02 – 2014-05-03 (×2): 30 mg via SUBCUTANEOUS
  Filled 2014-05-01 (×2): qty 0.3

## 2014-05-01 MED ORDER — CLINDAMYCIN PHOSPHATE 600 MG/50ML IV SOLN
600.0000 mg | Freq: Three times a day (TID) | INTRAVENOUS | Status: AC
  Administered 2014-05-02 (×2): 600 mg via INTRAVENOUS
  Filled 2014-05-01 (×2): qty 50

## 2014-05-01 MED ORDER — LORAZEPAM 1 MG PO TABS
1.0000 mg | ORAL_TABLET | Freq: Four times a day (QID) | ORAL | Status: DC | PRN
  Administered 2014-05-02 (×2): 1 mg via ORAL
  Filled 2014-05-01 (×2): qty 1

## 2014-05-01 MED ORDER — VITAMIN C 500 MG PO TABS
500.0000 mg | ORAL_TABLET | Freq: Every day | ORAL | Status: DC
  Administered 2014-05-02 – 2014-05-03 (×2): 500 mg via ORAL
  Filled 2014-05-01 (×3): qty 1

## 2014-05-01 MED ORDER — INSULIN GLARGINE 100 UNIT/ML ~~LOC~~ SOLN
15.0000 [IU] | Freq: Every day | SUBCUTANEOUS | Status: DC
  Administered 2014-05-02 (×2): 15 [IU] via SUBCUTANEOUS
  Filled 2014-05-01 (×3): qty 0.15

## 2014-05-01 MED ORDER — LACTATED RINGERS IV SOLN: INTRAVENOUS | Status: DC

## 2014-05-01 MED ORDER — CEFAZOLIN SODIUM-DEXTROSE 2-3 GM-% IV SOLR
2.0000 g | Freq: Four times a day (QID) | INTRAVENOUS | Status: AC
  Administered 2014-05-02 (×2): 2 g via INTRAVENOUS
  Filled 2014-05-01 (×2): qty 50

## 2014-05-01 MED ORDER — ENOXAPARIN SODIUM 30 MG/0.3ML ~~LOC~~ SOLN: 30.0000 mg | SUBCUTANEOUS | Status: AC

## 2014-05-01 MED ORDER — HYDROCODONE-ACETAMINOPHEN 5-325 MG PO TABS: 1.0000 | ORAL_TABLET | ORAL | Status: DC | PRN

## 2014-05-01 MED ORDER — POLYETHYLENE GLYCOL 3350 17 G PO PACK: 17.0000 g | PACK | Freq: Every day | ORAL | Status: DC | PRN

## 2014-05-01 MED ORDER — MENTHOL 3 MG MT LOZG
1.0000 | LOZENGE | OROMUCOSAL | Status: DC | PRN
  Filled 2014-05-01: qty 9

## 2014-05-01 MED ORDER — ONDANSETRON HCL 4 MG/2ML IJ SOLN: 4.0000 mg | Freq: Four times a day (QID) | INTRAMUSCULAR | Status: DC | PRN

## 2014-05-01 SURGICAL SUPPLY — 39 items
BAG ZIPLOCK 12X15 (MISCELLANEOUS) IMPLANT
BIT DRILL CANN LG 4.3MM (BIT) ×1 IMPLANT
BNDG COHESIVE 6X5 TAN STRL LF (GAUZE/BANDAGES/DRESSINGS) ×3 IMPLANT
BNDG GAUZE ELAST 4 BULKY (GAUZE/BANDAGES/DRESSINGS) IMPLANT
CLOTH 2% CHLOROHEXIDINE 3PK (PERSONAL CARE ITEMS) ×3 IMPLANT
DRAPE C-ARM 42X120 X-RAY (DRAPES) IMPLANT
DRAPE INCISE IOBAN 66X45 STRL (DRAPES) ×3 IMPLANT
DRAPE STERI IOBAN 125X83 (DRAPES) ×3 IMPLANT
DRILL BIT CANN LG 4.3MM (BIT) ×3
DRSG MEPILEX BORDER 4X4 (GAUZE/BANDAGES/DRESSINGS) ×3 IMPLANT
DRSG PAD ABDOMINAL 8X10 ST (GAUZE/BANDAGES/DRESSINGS) IMPLANT
DURAPREP 26ML APPLICATOR (WOUND CARE) ×3 IMPLANT
ELECT REM PT RETURN 9FT ADLT (ELECTROSURGICAL) ×3
ELECTRODE REM PT RTRN 9FT ADLT (ELECTROSURGICAL) ×1 IMPLANT
GLOVE BIOGEL PI IND STRL 7.5 (GLOVE) ×1 IMPLANT
GLOVE BIOGEL PI IND STRL 8 (GLOVE) ×1 IMPLANT
GLOVE BIOGEL PI INDICATOR 7.5 (GLOVE) ×2
GLOVE BIOGEL PI INDICATOR 8 (GLOVE) ×2
GLOVE SURG SS PI 7.5 STRL IVOR (GLOVE) ×12 IMPLANT
GLOVE SURG SS PI 8.0 STRL IVOR (GLOVE) ×6 IMPLANT
GOWN STRL REUS W/ TWL XL LVL3 (GOWN DISPOSABLE) ×1 IMPLANT
GOWN STRL REUS W/TWL LRG LVL3 (GOWN DISPOSABLE) ×3 IMPLANT
GOWN STRL REUS W/TWL XL LVL3 (GOWN DISPOSABLE) ×8 IMPLANT
GUIDEPIN 3.2X17.5 THRD DISP (PIN) ×3 IMPLANT
KIT BASIN OR (CUSTOM PROCEDURE TRAY) ×3 IMPLANT
MANIFOLD NEPTUNE II (INSTRUMENTS) ×3 IMPLANT
NAIL HIP FRACT 130D 11X180 (Screw) ×3 IMPLANT
PACK GENERAL/GYN (CUSTOM PROCEDURE TRAY) ×3 IMPLANT
PAD CAST 4YDX4 CTTN HI CHSV (CAST SUPPLIES) IMPLANT
PADDING CAST COTTON 4X4 STRL (CAST SUPPLIES)
SCREW BONE CORTICAL 5.0X36 (Screw) ×3 IMPLANT
SCREW LAG HIP NAIL 10.5X95 (Screw) ×3 IMPLANT
SPONGE GAUZE 4X4 12PLY (GAUZE/BANDAGES/DRESSINGS) IMPLANT
STAPLER VISISTAT (STAPLE) ×3 IMPLANT
SUT VIC AB 0 CT1 27 (SUTURE) ×4
SUT VIC AB 0 CT1 27XBRD ANTBC (SUTURE) ×2 IMPLANT
SUT VIC AB 2-0 CT1 27 (SUTURE) ×2
SUT VIC AB 2-0 CT1 TAPERPNT 27 (SUTURE) ×1 IMPLANT
TRAY FOLEY CATH 14FRSI W/METER (CATHETERS) IMPLANT

## 2014-05-01 NOTE — ED Notes (Signed)
Made pt aware of UA, left urinal at bedside, pt unable to urinate at this moment.

## 2014-05-01 NOTE — Brief Op Note (Signed)
05/01/2014  5:13 PM  PATIENT:  Benjamin [name]  63 y.o. male  PRE-OPERATIVE DIAGNOSIS:  right intertrochanteric hip fracture  POST-OPERATIVE DIAGNOSIS:  * No post-op diagnosis entered *  PROCEDURE:  Procedure(s): RIGHT INTERTROCHANTERIC NAIL (Right)  SURGEON:  Surgeon(s) and Role:    * [name], MD - Primary  PHYSICIAN ASSISTANT:   ASSISTANTS: none   ANESTHESIA:   general  EBL:     BLOOD ADMINISTERED:none  DRAINS: none   LOCAL MEDICATIONS USED:  MARCAINE     SPECIMEN:  No Specimen  DISPOSITION OF SPECIMEN:  N/A  COUNTS:  YES  TOURNIQUET:  * No tourniquets in log *  DICTATION: .Other Dictation: Dictation Number [id number]  PLAN OF CARE: Admit to inpatient   PATIENT DISPOSITION:  PACU - hemodynamically stable.   Delay start of Pharmacological VTE agent (>24hrs) due to surgical blood loss or risk of bleeding: no

## 2014-05-01 NOTE — Transfer of Care (Signed)
Immediate Anesthesia Transfer of Care Note  Patient: Benjamin [name]  Procedure(s) Performed: Procedure(s): RIGHT INTERTROCHANTERIC NAIL (Right)  Patient Location: PACU  Anesthesia Type:General  Level of Consciousness: awake, alert , oriented and patient cooperative  Airway & Oxygen Therapy: Patient Spontanous Breathing and Patient connected to face mask oxygen  Post-op Assessment: Report given to PACU RN, Post -op Vital signs reviewed and stable and Patient moving all extremities  Post vital signs: Reviewed and stable  Complications: No apparent anesthesia complications

## 2014-05-01 NOTE — Anesthesia Preprocedure Evaluation (Addendum)
Anesthesia Evaluation  Patient identified by MRN, date of birth, ID band Patient awake    Reviewed: Allergy & Precautions, H&P , NPO status , Patient's Chart, lab work & pertinent test results  Airway Mallampati: II TM Distance: >3 FB Neck ROM: full    Dental  (+) Poor Dentition, Missing, Dental Advisory Given Extremely poor dentition.  Missing many front teeth and rest are broken and rotten:   Pulmonary neg pulmonary ROS, former smoker,  breath sounds clear to auscultation  Pulmonary exam normal       Cardiovascular Exercise Tolerance: Good hypertension, Pt. on medications Rhythm:regular Rate:Normal     Neuro/Psych negative neurological ROS  negative psych ROS   GI/Hepatic negative GI ROS, Neg liver ROS, (+)     substance abuse  alcohol use, pancreatitis   Endo/Other  diabetes, Well Controlled, Type 2, Oral Hypoglycemic Agents  Renal/GU negative Renal ROS  negative genitourinary   Musculoskeletal   Abdominal   Peds  Hematology negative hematology ROS (+)   Anesthesia Other Findings   Reproductive/Obstetrics negative OB ROS                         Anesthesia Physical Anesthesia Plan  ASA: III  Anesthesia Plan: General   Post-op Pain Management:    Induction: Intravenous  Airway Management Planned: Oral ETT  Additional Equipment:   Intra-op Plan:   Post-operative Plan: Extubation in OR  Informed Consent: I have reviewed the patients History and Physical, chart, labs and discussed the procedure including the risks, benefits and alternatives for the proposed anesthesia with the patient or authorized representative who has indicated his/her understanding and acceptance.   Dental Advisory Given  Plan Discussed with: CRNA and Surgeon  Anesthesia Plan Comments:         Anesthesia Quick Evaluation

## 2014-05-01 NOTE — Progress Notes (Signed)
05/01/2014 A. Jamell Opfer RNCM 1708pm EDCM spoke to patient at bedside. Patient reports he lives at home alone.  Patient has a brother who lives in [place] who he speaks to everyday.  Patient reports he does not have anyone who checks in on him.  Patient has a walker and a cane at home.  Patient confirms he has home health services with [place] a visiting RN and PT per patient.  Patient reports he was just at [facility] for 5-6 weeks for his left shoulder.  [facility] provided patient with a list of home health agencies in [place] highlighting [place].  Also provided patient with a list of private duty nursing agencies and explained to patient that these services would be an out of pocket expense for the patient.  Consult for social work already placed.  No further EDCM needs at this time.

## 2014-05-01 NOTE — ED Notes (Signed)
Bed: WA01 Expected date:  Expected time:  Means of arrival:  Comments: Hip fx

## 2014-05-01 NOTE — Consult Note (Signed)
Reason for Consult: Right hip fracture Referring Physician: EDP  Benjamin [name] is an 63 y.o. male.  HPI: [place] today.  Past Medical History  Diagnosis Date  . Diabetes mellitus   . Hypertension   . Alcohol abuse   . Pancreatitis     Past Surgical History  Procedure Laterality Date  . Knee surgery    . Pancreas surgery    . Esophagogastroduodenoscopy N/A 06/19/2013    Procedure: ESOPHAGOGASTRODUODENOSCOPY (EGD);  Surgeon: Cleotis Nipper, MD;  Location: [facility] ENDOSCOPY;  Service: Endoscopy;  Laterality: N/A;  . Orif humerus fracture Left 02/05/2014    Procedure: OPEN REDUCTION INTERNAL FIXATION (ORIF) LEFT  PROXIMAL HUMERUS FRACTURE;  Surgeon: Marin Shutter, MD;  Location: [facility];  Service: Orthopedics;  Laterality: Left;  . Humeral hemiarthroplasty Left 02/28/2014    DR SUPPLE  . Shoulder hemi-arthroplasty Left 02/28/2014    Procedure: HEMI-ARTHROPLASTY ;  Surgeon: Marin Shutter, MD;  Location: [facility];  Service: Orthopedics;  Laterality: Left;    Family History  Problem Relation Age of Onset  . Diabetes Mother   . Dementia Father     Social History:  reports that he quit smoking about 8 months ago. His smoking use included Cigarettes. He has a 40 pack-year smoking history. He has never used smokeless tobacco. He reports that he drinks alcohol. He reports that he does not use illicit drugs.  Allergies:  Allergies  Allergen Reactions  . Tetanus Toxoids Rash    Medications: I have reviewed the patient's current medications.  Results for orders placed during the hospital encounter of 05/01/14 (from the past 48 hour(s))  BASIC METABOLIC PANEL     Status: Abnormal   Collection Time    05/01/14  3:13 PM      Result Value Ref Range   Sodium 136 (*) 137 - 147 mEq/L   Potassium 4.0  3.7 - 5.3 mEq/L   Chloride 93 (*) 96 - 112 mEq/L   CO2 23  19 - 32 mEq/L   Glucose, Bld 244 (*) 70 - 99 mg/dL   BUN 8  6 - 23 mg/dL   Creatinine, Ser 0.63  0.50 - 1.35 mg/dL   Calcium 8.7  8.4 - 10.5  mg/dL   GFR calc non Af Amer >90  >90 mL/min   GFR calc Af Amer >90  >90 mL/min   Comment: (NOTE)     The eGFR has been calculated using the CKD EPI equation.     This calculation has not been validated in all clinical situations.     eGFR's persistently <90 mL/min signify possible Chronic Kidney     Disease.  CBC WITH DIFFERENTIAL     Status: Abnormal   Collection Time    05/01/14  3:13 PM      Result Value Ref Range   WBC 9.2  4.0 - 10.5 K/uL   RBC 4.37  4.22 - 5.81 MIL/uL   Hemoglobin 12.8 (*) 13.0 - 17.0 g/dL   HCT 37.3 (*) 39.0 - 52.0 %   MCV 85.4  78.0 - 100.0 fL   MCH 29.3  26.0 - 34.0 pg   MCHC 34.3  30.0 - 36.0 g/dL   RDW 16.9 (*) 11.5 - 15.5 %   Platelets 484 (*) 150 - 400 K/uL   Neutrophils Relative % 78 (*) 43 - 77 %   Neutro Abs 7.2  1.7 - 7.7 K/uL   Lymphocytes Relative 17  12 - 46 %   Lymphs Abs  1.6  0.7 - 4.0 K/uL   Monocytes Relative 4  3 - 12 %   Monocytes Absolute 0.3  0.1 - 1.0 K/uL   Eosinophils Relative 0  0 - 5 %   Eosinophils Absolute 0.0  0.0 - 0.7 K/uL   Basophils Relative 1  0 - 1 %   Basophils Absolute 0.1  0.0 - 0.1 K/uL    Dg Chest 1 View  05/01/2014   CLINICAL DATA:  Pre-op for right hip arthroplasty.  EXAM: CHEST - 1 VIEW  COMPARISON:  Radiographs 02/28/2014 and 02/03/2014.  FINDINGS: 1501 hr. The heart size and mediastinal contours are stable. The lungs are clear with improved basilar aeration bilaterally. No pleural effusion or pneumothorax is demonstrated. There are prominent skin folds overlying the mid chest bilaterally. Patient is status post left shoulder arthroplasty.  IMPRESSION: No active cardiopulmonary process.   Electronically Signed   By: Camie Patience M.D.   On: 05/01/2014 15:08   Dg Hip Complete Right  05/01/2014   CLINICAL DATA:  Fall, right hip pain.  EXAM: RIGHT HIP - COMPLETE 2+ VIEW  COMPARISON:  None.  FINDINGS: There is a right femoral intertrochanteric fracture with varus angulation. No subluxation or dislocation. Diffuse  osteopenia. Mild degenerative changes in the hips bilaterally.  IMPRESSION: Right femoral intertrochanteric fracture with varus angulation.   Electronically Signed   By: Rolm Baptise M.D.   On: 05/01/2014 14:43    Review of Systems  Musculoskeletal: Positive for joint pain.  All other systems reviewed and are negative.  Blood pressure 118/76, pulse 115, temperature 98 F (36.7 C), temperature source Oral, resp. rate 22, SpO2 96.00%. Physical Exam  Constitutional: He is oriented to person, place, and time. He appears well-developed.  HENT:  Head: Normocephalic.  Eyes: Pupils are equal, round, and reactive to light.  Neck: Normal range of motion.  Cardiovascular: Normal rate.   Respiratory: Effort normal.  GI: Soft.  Musculoskeletal:  Right hip pain with motion. NVI. Compartments soft. Knee stable  Neurological: He is alert and oriented to person, place, and time.  Skin: Skin is warm and dry.  Psychiatric: He has a normal mood and affect.    Assessment/Plan:  Right IT hip fracture Plan IM nail. Risks discussed.   [name] 05/01/2014, 5:07 PM

## 2014-05-01 NOTE — ED Notes (Signed)
Per EMS: Pt was at home by himself.  States he slipped in some water on the floor in his kitchen and landed on his rt hip.  Rt shortening/outward rotation noted.  Given 295mcg fentanyl en route.

## 2014-05-01 NOTE — H&P (Signed)
[facility] History and Physical  Benjamin [name] EGB:[id number] DOB: 16-Sep-1951 DOA: 05/01/2014  Referring physician: Harlow Mares, PA-C PCP: [name], MD   Chief Complaint: Right hip fracture  HPI: Benjamin [name] is a 63 y.o. male with past medical history of diabetes, hypertension and history of alcohol abuse. Patient had a recent left shoulder hemiarthroplasty done by Dr. supple on 02/28/2014. Patient came in today after a fall. Patient said he slipped earlier today in a wet spot on his kitchen floor and he fell on his right side, he couldn't get up and had severe pain around his right hip. Patient brought to the hospital for further evaluation. In the ED x-ray of the right hip showed right intertrochanteric fracture with varus angulation, Dr. Tonita Cong of orthopedic service consulted for probable repair.   Review of Systems:  Constitutional: negative for anorexia, fevers and sweats Eyes: negative for irritation, redness and visual disturbance Ears, nose, mouth, throat, and face: negative for earaches, epistaxis, nasal congestion and sore throat Respiratory: negative for cough, dyspnea on exertion, sputum and wheezing Cardiovascular: negative for chest pain, dyspnea, lower extremity edema, orthopnea, palpitations and syncope Gastrointestinal: negative for abdominal pain, constipation, diarrhea, melena, nausea and vomiting Genitourinary:negative for dysuria, frequency and hematuria Hematologic/lymphatic: negative for bleeding, easy bruising and lymphadenopathy Musculoskeletal: Shortening and lateral rotation of her RLE Neurological: negative for coordination problems, gait problems, headaches and weakness Endocrine: negative for diabetic symptoms including polydipsia, polyuria and weight loss Allergic/Immunologic: negative for anaphylaxis, hay fever and urticaria  Past Medical History  Diagnosis Date  . Diabetes mellitus   . Hypertension   . Alcohol abuse   . Pancreatitis      Past Surgical History  Procedure Laterality Date  . Knee surgery    . Pancreas surgery    . Esophagogastroduodenoscopy N/A 06/19/2013    Procedure: ESOPHAGOGASTRODUODENOSCOPY (EGD);  Surgeon: Cleotis Nipper, MD;  Location: [facility] ENDOSCOPY;  Service: Endoscopy;  Laterality: N/A;  . Orif humerus fracture Left 02/05/2014    Procedure: OPEN REDUCTION INTERNAL FIXATION (ORIF) LEFT  PROXIMAL HUMERUS FRACTURE;  Surgeon: Marin Shutter, MD;  Location: [facility];  Service: Orthopedics;  Laterality: Left;  . Humeral hemiarthroplasty Left 02/28/2014    DR SUPPLE  . Shoulder hemi-arthroplasty Left 02/28/2014    Procedure: HEMI-ARTHROPLASTY ;  Surgeon: Marin Shutter, MD;  Location: [place];  Service: Orthopedics;  Laterality: Left;   Social History:   reports that he quit smoking about 8 months ago. His smoking use included Cigarettes. He has a 40 pack-year smoking history. He has never used smokeless tobacco. He reports that he drinks alcohol. He reports that he does not use illicit drugs.  Allergies  Allergen Reactions  . Tetanus Toxoids Rash    Family History  Problem Relation Age of Onset  . Diabetes Mother   . Dementia Father      Prior to Admission medications   Medication Sig Start Date End Date Taking? Authorizing Provider  acetaminophen (TYLENOL) 500 MG tablet Take 500 mg by mouth every 6 (six) hours as needed for mild pain.   Yes Historical Provider, MD  cloNIDine (CATAPRES) 0.1 MG tablet Take 0.1 mg by mouth 3 (three) times daily.    Yes Historical Provider, MD  ferrous sulfate 325 (65 FE) MG tablet Take 325 mg by mouth daily with breakfast.   Yes Historical Provider, MD  folic acid (FOLVITE) 1 MG tablet Take 1 mg by mouth daily.   Yes Historical Provider, MD  furosemide (LASIX) 20  MG tablet Take 20 mg by mouth daily.    Yes Historical Provider, MD  insulin glargine (LANTUS) 100 UNIT/ML injection Inject 15 Units into the skin at bedtime.    Yes Historical Provider, MD  insulin lispro  (HUMALOG) 100 UNIT/ML injection Inject 2-8 Units into the skin 3 (three) times daily before meals. Dose is based on what he is going to eat.   Yes Historical Provider, MD  lactose free nutrition (BOOST) LIQD Take 237 mLs by mouth 3 (three) times daily between meals.   Yes Historical Provider, MD  Multiple Vitamin (MULTIVITAMIN WITH MINERALS) TABS Take 1 tablet by mouth daily. 07/11/12  Yes Thurnell Lose, MD  oxyCODONE (OXY IR/ROXICODONE) 5 MG immediate release tablet Take 5 mg by mouth at bedtime as needed for moderate pain.    Yes Historical Provider, MD  thiamine 100 MG tablet Take 1 tablet (100 mg total) by mouth daily. 06/21/13  Yes Ripudeep Krystal Eaton, MD  vitamin C (ASCORBIC ACID) 500 MG tablet Take 500 mg by mouth daily.    Yes Historical Provider, MD  zolpidem (AMBIEN) 5 MG tablet Take 1 tablet (5 mg total) by mouth at bedtime as needed for sleep. 03/01/14  Yes Wille Celeste, PA-C   Physical Exam: Filed Vitals:   05/01/14 1527  BP: 118/76  Pulse: 124  Temp:   Resp: 20   Constitutional: Oriented to person, place, and time. Well-developed and well-nourished. Cooperative.  Head: Normocephalic and atraumatic.  Nose: Nose normal.  Mouth/Throat: Uvula is midline, oropharynx is clear and moist and mucous membranes are normal.  Eyes: Conjunctivae and EOM are normal. Pupils are equal, round, and reactive to light.  Neck: Trachea normal and normal range of motion. Neck supple.  Cardiovascular: Normal rate, regular rhythm, S1 normal, S2 normal, normal heart sounds and intact distal pulses.   Pulmonary/Chest: Effort normal and breath sounds normal.  Abdominal: Soft. Bowel sounds are normal. There is no hepatosplenomegaly. There is no tenderness.  Musculoskeletal: Shortening on the lateral rotation of the right lower extremity.  Neurological: Alert and oriented to person, place, and time.  Rest of exam not done. Skin: Skin is warm, dry and intact.  Psychiatric: Has a normal mood and affect. Speech  is normal and behavior is normal.   Labs on Admission:  Basic Metabolic Panel:  Recent Labs Lab 05/01/14 1513  NA 136*  K 4.0  CL 93*  CO2 23  GLUCOSE 244*  BUN 8  CREATININE 0.63  CALCIUM 8.7   Liver Function Tests: No results found for this basename: AST, ALT, ALKPHOS, BILITOT, PROT, ALBUMIN,  in the last 168 hours No results found for this basename: LIPASE, AMYLASE,  in the last 168 hours No results found for this basename: AMMONIA,  in the last 168 hours CBC:  Recent Labs Lab 05/01/14 1513  WBC 9.2  NEUTROABS 7.2  HGB 12.8*  HCT 37.3*  MCV 85.4  PLT 484*   Cardiac Enzymes: No results found for this basename: CKTOTAL, CKMB, CKMBINDEX, TROPONINI,  in the last 168 hours  BNP (last 3 results) No results found for this basename: PROBNP,  in the last 8760 hours CBG: No results found for this basename: GLUCAP,  in the last 168 hours  Radiological Exams on Admission: Dg Chest 1 View  05/01/2014   CLINICAL DATA:  Pre-op for right hip arthroplasty.  EXAM: CHEST - 1 VIEW  COMPARISON:  Radiographs 02/28/2014 and 02/03/2014.  FINDINGS: 1501 hr. The heart size and mediastinal contours  are stable. The lungs are clear with improved basilar aeration bilaterally. No pleural effusion or pneumothorax is demonstrated. There are prominent skin folds overlying the mid chest bilaterally. Patient is status post left shoulder arthroplasty.  IMPRESSION: No active cardiopulmonary process.   Electronically Signed   By: Camie Patience M.D.   On: 05/01/2014 15:08   Dg Hip Complete Right  05/01/2014   CLINICAL DATA:  Fall, right hip pain.  EXAM: RIGHT HIP - COMPLETE 2+ VIEW  COMPARISON:  None.  FINDINGS: There is a right femoral intertrochanteric fracture with varus angulation. No subluxation or dislocation. Diffuse osteopenia. Mild degenerative changes in the hips bilaterally.  IMPRESSION: Right femoral intertrochanteric fracture with varus angulation.   Electronically Signed   By: Rolm Baptise M.D.    On: 05/01/2014 14:43    EKG: Independently reviewed.   Assessment/Plan Principal Problem:   Intertrochanteric fracture of right hip Active Problems:   ETOH abuse   Anemia   Type II or unspecified type diabetes mellitus with unspecified complication, uncontrolled   Fall    Right hip fracture -Orthopedics consulted, for probable surgical intervention. -Patient will be admitted to [place] bed. -Pain will be controlled with narcotics, muscle relaxants. -Patient will be n.p.o. anticipating surgery probably tonight.  Fall -Patient denies any type of prodrome prior to the fall. -Patient denies any chest pain, dizziness, SOB or loss of consciousness, reports that he fell after he slipped on wet spot. -No further workup, no telemetry or echocardiogram.  Preoperative risk stratification -Patient has IDDM and hypertension, had recent orthopedic surgery without problems. -No history of CHF, CAD, CVA/TIA, CKD or arrhythmias, normal chest x-ray, EKG showed sinus tachycardia. -Low risk, revised cardiac risk index (RCRI) is 0.9% chance of getting major cardiac events. -Recommend to proceed with surgery without any further evaluation or intervention.  Insulin-dependent diabetes -Check hemoglobin A1c, patient is on Lantus insulin will continue. -Start carb modified diet after surgery.  History of alcohol abuse -Check blood alcohol level, says she drinks about 3-4 glasses of wine per day. -Last drink on 04/30/2014 at night, watch for withdrawal symptoms, if any start CIWA protocol.  Code Status: Full code Family Communication: Plan discussed with the patient Disposition Plan: Med surge  Time spent: 70 minutes  [facility] Pager [phone]

## 2014-05-01 NOTE — Anesthesia Postprocedure Evaluation (Signed)
  Anesthesia Post-op Note  Patient: Benjamin [name]  Procedure(s) Performed: Procedure(s) (LRB): RIGHT INTERTROCHANTERIC NAIL (Right)  Patient Location: PACU  Anesthesia Type: General  Level of Consciousness: awake and alert   Airway and Oxygen Therapy: Patient Spontanous Breathing  Post-op Pain: mild  Post-op Assessment: Post-op Vital signs reviewed, Patient's Cardiovascular Status Stable, Respiratory Function Stable, Patent Airway and No signs of Nausea or vomiting  Last Vitals:  Filed Vitals:   05/01/14 2145  BP: 129/79  Pulse: 118  Temp:   Resp: 12    Post-op Vital Signs: stable   Complications: No apparent anesthesia complications

## 2014-05-01 NOTE — ED Provider Notes (Signed)
CSN: [id number]     Arrival date & time 05/01/14  1354 History   First MD Initiated Contact with Patient 05/01/14 1402     Chief Complaint  Patient presents with  . Hip Injury     (Consider location/radiation/quality/duration/timing/severity/associated sxs/prior Treatment) HPI Comments: Patient is a 63 year old male past medical history significant for DM, hypertension, history of alcohol abuse, pancreatitis presented to the emergency department for right hip pain. Patient states his in his kitchen when he slipped and fell  planning on his right hip. He denies consciousness or striking his head. Denies any headache. Patient is complaining of right hip pain without radiation or numbness down extremity. Alleviating factors: medications given by EMS. Aggravating factors: palpation, ROM, and attempts to ambulate. Patient was seen by Dr. supple back in March of this year for a shoulder injury. Denies any recent alcohol use.    Past Medical History  Diagnosis Date  . Diabetes mellitus   . Hypertension   . Alcohol abuse   . Pancreatitis    Past Surgical History  Procedure Laterality Date  . Knee surgery    . Pancreas surgery    . Esophagogastroduodenoscopy N/A 06/19/2013    Procedure: ESOPHAGOGASTRODUODENOSCOPY (EGD);  Surgeon: Cleotis Nipper, MD;  Location: [facility] ENDOSCOPY;  Service: Endoscopy;  Laterality: N/A;  . Orif humerus fracture Left 02/05/2014    Procedure: OPEN REDUCTION INTERNAL FIXATION (ORIF) LEFT  PROXIMAL HUMERUS FRACTURE;  Surgeon: Marin Shutter, MD;  Location: [facility];  Service: Orthopedics;  Laterality: Left;  . Humeral hemiarthroplasty Left 02/28/2014    DR SUPPLE  . Shoulder hemi-arthroplasty Left 02/28/2014    Procedure: HEMI-ARTHROPLASTY ;  Surgeon: Marin Shutter, MD;  Location: [place];  Service: Orthopedics;  Laterality: Left;   Family History  Problem Relation Age of Onset  . Diabetes Mother   . Dementia Father    History  Substance Use Topics  . Smoking status:  Former Smoker -- 1.00 packs/day for 40 years    Types: Cigarettes    Quit date: 08/16/2013  . Smokeless tobacco: Never Used  . Alcohol Use: Yes     Comment: 4-5 glasses of wine a day    Review of Systems  Constitutional: Negative for fever and chills.  Gastrointestinal: Negative for nausea and vomiting.  Musculoskeletal: Positive for arthralgias, joint swelling and myalgias. Negative for back pain and neck pain.  Neurological: Negative for syncope and headaches.  All other systems reviewed and are negative.     Allergies  Tetanus toxoids  Home Medications   Prior to Admission medications   Medication Sig Start Date End Date Taking? Authorizing Provider  acetaminophen (TYLENOL) 500 MG tablet Take 500 mg by mouth every 6 (six) hours as needed for mild pain.   Yes Historical Provider, MD  cloNIDine (CATAPRES) 0.1 MG tablet Take 0.1 mg by mouth 3 (three) times daily.    Yes Historical Provider, MD  ferrous sulfate 325 (65 FE) MG tablet Take 325 mg by mouth daily with breakfast.   Yes Historical Provider, MD  folic acid (FOLVITE) 1 MG tablet Take 1 mg by mouth daily.   Yes Historical Provider, MD  furosemide (LASIX) 20 MG tablet Take 20 mg by mouth daily.    Yes Historical Provider, MD  insulin glargine (LANTUS) 100 UNIT/ML injection Inject 15 Units into the skin at bedtime.    Yes Historical Provider, MD  insulin lispro (HUMALOG) 100 UNIT/ML injection Inject 2-8 Units into the skin 3 (three) times daily before  meals. Dose is based on what he is going to eat.   Yes Historical Provider, MD  lactose free nutrition (BOOST) LIQD Take 237 mLs by mouth 3 (three) times daily between meals.   Yes Historical Provider, MD  Multiple Vitamin (MULTIVITAMIN WITH MINERALS) TABS Take 1 tablet by mouth daily. 07/11/12  Yes Thurnell Lose, MD  oxyCODONE (OXY IR/ROXICODONE) 5 MG immediate release tablet Take 5 mg by mouth at bedtime as needed for moderate pain.    Yes Historical Provider, MD  thiamine 100  MG tablet Take 1 tablet (100 mg total) by mouth daily. 06/21/13  Yes Ripudeep Krystal Eaton, MD  vitamin C (ASCORBIC ACID) 500 MG tablet Take 500 mg by mouth daily.    Yes Historical Provider, MD  zolpidem (AMBIEN) 5 MG tablet Take 1 tablet (5 mg total) by mouth at bedtime as needed for sleep. 03/01/14  Yes Arlo C Lassen, PA-C   BP 118/76  Pulse 124  Temp(Src) 98 F (36.7 C) (Oral)  Resp 20  SpO2 100% Physical Exam  Nursing note and vitals reviewed. Constitutional: He is oriented to person, place, and time. He appears well-developed and well-nourished. No distress.  HENT:  Head: Normocephalic and atraumatic.  Right Ear: External ear normal.  Left Ear: External ear normal.  Nose: Nose normal.  Mouth/Throat: Oropharynx is clear and moist.  Eyes: Conjunctivae are normal.  Neck: Normal range of motion. Neck supple.  Cardiovascular: Normal rate, regular rhythm, normal heart sounds and intact distal pulses.   Pulmonary/Chest: Effort normal and breath sounds normal. No respiratory distress.  Abdominal: Soft.  Musculoskeletal: He exhibits tenderness.       Right hip: He exhibits decreased range of motion, decreased strength, tenderness and bony tenderness.  Right extremity is shortened and externally rotated. Range of motion is decreased due to pain and deformity. No wounds or lacerations appreciated. Left lower extremity is unremarkable on examination with intact range of motion. Sensation is grossly intact in lower extremities.  Neurological: He is alert and oriented to person, place, and time.  Skin: Skin is warm and dry. He is not diaphoretic.  Psychiatric: He has a normal mood and affect.    ED Course  Procedures (including critical care time) Medications  LORazepam (ATIVAN) injection 1 mg (not administered)  HYDROmorphone (DILAUDID) injection 1 mg (not administered)  ondansetron (ZOFRAN) injection 4 mg (not administered)  morphine 4 MG/ML injection 4 mg (4 mg Intravenous Given 05/01/14 1514)   sodium chloride 0.9 % bolus 1,000 mL (1,000 mLs Intravenous New Bag/Given 05/01/14 1534)    Labs Review Labs Reviewed  BASIC METABOLIC PANEL - Abnormal; Notable for the following:    Sodium 136 (*)    Chloride 93 (*)    Glucose, Bld 244 (*)    All other components within normal limits  CBC WITH DIFFERENTIAL - Abnormal; Notable for the following:    Hemoglobin 12.8 (*)    HCT 37.3 (*)    RDW 16.9 (*)    Platelets 484 (*)    Neutrophils Relative % 78 (*)    All other components within normal limits  HEPATIC FUNCTION PANEL  ETHANOL  URINALYSIS, ROUTINE W REFLEX MICROSCOPIC  URINE RAPID DRUG SCREEN (HOSP PERFORMED)    Imaging Review Dg Chest 1 View  05/01/2014   CLINICAL DATA:  Pre-op for right hip arthroplasty.  EXAM: CHEST - 1 VIEW  COMPARISON:  Radiographs 02/28/2014 and 02/03/2014.  FINDINGS: 1501 hr. The heart size and mediastinal contours are stable. The lungs  are clear with improved basilar aeration bilaterally. No pleural effusion or pneumothorax is demonstrated. There are prominent skin folds overlying the mid chest bilaterally. Patient is status post left shoulder arthroplasty.  IMPRESSION: No active cardiopulmonary process.   Electronically Signed   By: Camie Patience M.D.   On: 05/01/2014 15:08   Dg Hip Complete Right  05/01/2014   CLINICAL DATA:  Fall, right hip pain.  EXAM: RIGHT HIP - COMPLETE 2+ VIEW  COMPARISON:  None.  FINDINGS: There is a right femoral intertrochanteric fracture with varus angulation. No subluxation or dislocation. Diffuse osteopenia. Mild degenerative changes in the hips bilaterally.  IMPRESSION: Right femoral intertrochanteric fracture with varus angulation.   Electronically Signed   By: Rolm Baptise M.D.   On: 05/01/2014 14:43     EKG Interpretation None      MDM   Final diagnoses:  Intertrochanteric fracture of right hip    Filed Vitals:   05/01/14 1527  BP: 118/76  Pulse: 124  Temp:   Resp: 20   Afebrile, NAD, non-toxic appearing,  AAOx4.   Patient presented after a mechanical fall with right hip pain. Right leg is Shortened and externally rotated. Sensation is intact. Distal pulses intact. Decreased range of motion. IV fluids and pain medication given. Patient noted to be mildly tachycardic. IV fluids and had been given for pain parents. Preop labs and x-ray and EKG obtained. Patient discussed with Dr. Maxie Better who is in the OR but will see patient once he has finished his current operative case. He would like patient to be admitted to [facility] and kept NPO for the time being. Patient admitted to the hospital service. Patient d/w with Dr. Wilson Singer, agrees with plan.    Harlow Mares, PA-C 05/01/14 1559

## 2014-05-01 NOTE — Progress Notes (Signed)
  CARE MANAGEMENT ED NOTE 05/01/2014  Patient:  Benjamin [name], Benjamin [name]   Account Number:  [id number]  Date Initiated:  05/01/2014  Documentation initiated by:  Jackelyn Poling  Subjective/Objective Assessment:   63 yr old 65 covered [place] pt home by himself.  States he slipped in some water on the floor in his kitchen and landed on his rt hip.  Rt shortening/outward rotation noted.  Given 243mcg fentanyl en route.     Subjective/Objective Assessment Detail:   pt d/c 04/11/14 from [facility] with [facility] for home health PT, OT, and RN.     Action/Plan:   ED CM left a voice message for Kathline Magic to inform her of pt admission and need to be follow for home health needs at d/c   Action/Plan Detail:   Anticipated DC Date:  05/04/2014     Status Recommendation to Physician:   Result of Recommendation:    Other ED Services  Consult [facility] [place]  Other   [place]   Choice offered to / List presented to:       [facility] arranged  HH-1 RN  [place]    Status of service:  Completed, signed off  ED Comments:   ED Comments Detail:

## 2014-05-01 NOTE — Progress Notes (Signed)
Clinical Social Work Department BRIEF PSYCHOSOCIAL ASSESSMENT 05/01/2014  Patient:  Benjamin [name], Benjamin [name]     Account Number:  [id number]     Admit date:  05/01/2014  Clinical Social Worker:  Luretha Rued  Date/Time:  05/01/2014 04:30 PM  Referred by:  CSW  Date Referred:  05/01/2014  Other Referral:   Interview type:  Patient Other interview type:   No one at bedside    PSYCHOSOCIAL DATA Living Status:  ALONE Admitted from facility:   Level of care:   Primary support name:  Saron Tweed Primary support relationship to patient:  [place] Degree of support available:   Patient reports that his brother is aware of his ED admission and is his primary support.    CURRENT CONCERNS  Other Concerns:    SOCIAL WORK ASSESSMENT / PLAN CSW met with patient at bedside to complete this assessment.  Patient presents as alert, oriented x3, calm, and cooperative. Patient reports that his brother is his primary support and lives very close to him. Patient reports he was recently in [place] and released home with [facility] for home health services.  He reports that home health services started two weeks ago. He reports being aware of the placement process and if he needs surgery at this point he would need SNF before returning home.  CSW provided the patient with SNF resources but he would like to return to [facility] if possible.   Assessment/plan status:  Psychosocial Support/Ongoing Assessment of Needs Other assessment/ plan:   Information/referral to community resources:   SNF    PATIENT'S/FAMILY'S RESPONSE TO PLAN OF CARE: Patient expressed his appreciation for the support of the social work department.     Chesley Noon, MSW, [place], 05/01/2014 Evening Clinical Social Worker [phone]

## 2014-05-01 NOTE — ED Notes (Signed)
Nurse on floor states she will call back to get report

## 2014-05-02 ENCOUNTER — Encounter (HOSPITAL_COMMUNITY): Payer: Self-pay | Admitting: Specialist

## 2014-05-02 DIAGNOSIS — E440 Moderate protein-calorie malnutrition: Secondary | ICD-10-CM | POA: Insufficient documentation

## 2014-05-02 LAB — COMPREHENSIVE METABOLIC PANEL
ALT: 28 U/L (ref 0–53)
AST: 23 U/L (ref 0–37)
Albumin: 2.1 g/dL — ABNORMAL LOW (ref 3.5–5.2)
Alkaline Phosphatase: 236 U/L — ABNORMAL HIGH (ref 39–117)
BUN: 10 mg/dL (ref 6–23)
CALCIUM: 7.9 mg/dL — AB (ref 8.4–10.5)
CO2: 22 meq/L (ref 19–32)
Chloride: 99 mEq/L (ref 96–112)
Creatinine, Ser: 0.67 mg/dL (ref 0.50–1.35)
GLUCOSE: 247 mg/dL — AB (ref 70–99)
Potassium: 4.7 mEq/L (ref 3.7–5.3)
SODIUM: 137 meq/L (ref 137–147)
Total Bilirubin: 0.3 mg/dL (ref 0.3–1.2)
Total Protein: 5.2 g/dL — ABNORMAL LOW (ref 6.0–8.3)

## 2014-05-02 LAB — HEMOGLOBIN A1C
Hgb A1c MFr Bld: 7.6 % — ABNORMAL HIGH (ref <5.7)
MEAN PLASMA GLUCOSE: 171 mg/dL — AB (ref <117)

## 2014-05-02 LAB — GLUCOSE, CAPILLARY
Glucose-Capillary: 169 mg/dL — ABNORMAL HIGH (ref 70–99)
Glucose-Capillary: 269 mg/dL — ABNORMAL HIGH (ref 70–99)

## 2014-05-02 LAB — VITAMIN D 25 HYDROXY (VIT D DEFICIENCY, FRACTURES): VIT D 25 HYDROXY: 21 ng/mL — AB (ref 30–89)

## 2014-05-02 NOTE — Progress Notes (Signed)
INITIAL NUTRITION ASSESSMENT  DOCUMENTATION CODES Per approved criteria  -Non-severe (moderate) malnutrition in the context of chronic illness  Pt meets criteria for moderate MALNUTRITION in the context of chronic illness as evidenced by moderate subcutaneous fat loss and muscle wasting, 7.5% body weight loss in 3 months.   INTERVENTION: -Recommend Boost TID as diet advancement tolerated -Encouraged intake of high kcal/protein foods -Will continue to monitor  NUTRITION DIAGNOSIS: Increased nutrient needs related to hip fx as evidenced by Day 1 post op, unintentional wt loss  Goal: Pt to meet >/= 90% of their estimated nutrition needs    Monitor:  Diet order, total protein/energy intake, labs, weights, glucose profile  Reason for Assessment: Consult  63 y.o. male  Admitting Dx: Intertrochanteric fracture of right hip  ASSESSMENT: Benjamin [name] is a 63 y.o. male with past medical history of diabetes, hypertension and history of alcohol abuse. Patient had a recent left shoulder hemiarthroplasty done by Dr. supple on 02/28/2014. Patient came in today after a fall. Patient said he slipped earlier today in a wet spot on his kitchen floor and he fell on his right side, he couldn't get up and had severe pain around his right hip. Patient brought to the hospital for further evaluation.  -Pt reported a slight decreased in appetite during 5-6 weeks stay at recovery clinical post left shoulder hemiarthroplasty. Was on DM/Carb Mod diet that restricted some of food choices.  -Diet recall indicated pt consuming 3 meals/day with Boost 3-4 times daily.  -Endorsed  10-13 lbs unintentional wt loss, usual body weight around 150 lbs. Weight loss has appeared to have occurred over past 2-3 months -Pt reported some pain post op, denied any nausea/abd pain post consumption of clear liquid diet -MD noted pt with hx of ETOH abuse, consumes 3-4 glasses of wine/day -Nutrition Focused Physical  Exam:  Subcutaneous Fat:  Orbital Region: WDL Upper Arm Region: moderate wasting Thoracic and Lumbar Region: n/a  Muscle:  Temple Region: WDL Clavicle Bone Region: WDL Clavicle and Acromion Bone Region: moderate Scapular Bone Region: moderate Dorsal Hand: WDL Patellar Region: moderate Anterior Thigh Region: n/a d/t post op pains Posterior Calf Region: mo  Edema: right hip edema    Height: Ht Readings from Last 1 Encounters:  05/01/14 5\' 10"  (1.778 m)    Weight: Wt Readings from Last 1 Encounters:  05/01/14 137 lb 9.1 oz (62.4 kg)    Ideal Body Weight: 166 lbs  % Ideal Body Weight: 83%  Wt Readings from Last 10 Encounters:  05/01/14 137 lb 9.1 oz (62.4 kg)  05/01/14 137 lb 9.1 oz (62.4 kg)  04/11/14 143 lb (64.864 kg)  04/03/14 143 lb (64.864 kg)  03/07/14 143 lb (64.864 kg)  03/05/14 146 lb (66.225 kg)  02/28/14 150 lb (68.04 kg)  02/28/14 150 lb (68.04 kg)  02/21/14 144 lb (65.318 kg)  01/29/14 148 lb 2.4 oz (67.2 kg)    Usual Body Weight: 150 lbs  % Usual Body Weight: 91%  BMI:  Body mass index is 19.74 kg/(m^2).  Estimated Nutritional Needs: Kcal: 1850-2050 Protein: 80-90 gram Fluid: >/=1800 ml/daily  Skin: WDL  Diet Order: Clear Liquid  EDUCATION NEEDS: -No education needs identified at this time   Intake/Output Summary (Last 24 hours) at 05/02/14 1119 Last data filed at 05/02/14 0650  Gross per 24 hour  Intake 4106.67 ml  Output    400 ml  Net 3706.67 ml    Last BM: 5/26   Labs:   Recent [employer]  05/01/14 1513 05/01/14 2320  NA 136*  --   K 4.0  --   CL 93*  --   CO2 23  --   BUN 8  --   CREATININE 0.63 0.61  CALCIUM 8.7  --   GLUCOSE 244*  --     CBG (last 3)   Recent Labs  05/01/14 1731 05/01/14 2104 05/02/14 0806  GLUCAP 214* 181* 169*    Scheduled Meds: . clindamycin (CLEOCIN) IV  600 mg Intravenous 3 times per day  . cloNIDine  0.1 mg Oral TID  . enoxaparin (LOVENOX) injection  30 mg Subcutaneous Q24H   . ferrous sulfate  325 mg Oral Q breakfast  . folic acid  1 mg Oral Daily  . furosemide  20 mg Oral Daily  . insulin aspart  0-9 Units Subcutaneous TID WC  . insulin glargine  15 Units Subcutaneous QHS  . lactose free nutrition  237 mL Oral TID BM  . multivitamin with minerals  1 tablet Oral Daily  . sodium chloride  3 mL Intravenous Q12H  . thiamine  100 mg Oral Daily  . vitamin B-12  250 mcg Oral Daily  . vitamin C  500 mg Oral Daily    Continuous Infusions: . sodium chloride 100 mL/hr at 05/01/14 2301  . lactated ringers Stopped (05/01/14 2100)    Past Medical History  Diagnosis Date  . Diabetes mellitus   . Hypertension   . Alcohol abuse   . Pancreatitis     Past Surgical History  Procedure Laterality Date  . Knee surgery    . Pancreas surgery    . Esophagogastroduodenoscopy N/A 06/19/2013    Procedure: ESOPHAGOGASTRODUODENOSCOPY (EGD);  Surgeon: Cleotis Nipper, MD;  Location: [facility] ENDOSCOPY;  Service: Endoscopy;  Laterality: N/A;  . Orif humerus fracture Left 02/05/2014    Procedure: OPEN REDUCTION INTERNAL FIXATION (ORIF) LEFT  PROXIMAL HUMERUS FRACTURE;  Surgeon: Marin Shutter, MD;  Location: [facility] ORS;  Service: Orthopedics;  Laterality: Left;  . Humeral hemiarthroplasty Left 02/28/2014    DR SUPPLE  . Shoulder hemi-arthroplasty Left 02/28/2014    Procedure: HEMI-ARTHROPLASTY ;  Surgeon: Marin Shutter, MD;  Location: [place];  Service: Orthopedics;  Laterality: Left;    Atlee Abide MS RD [place] Clinical Dietitian [phone]

## 2014-05-02 NOTE — ED Provider Notes (Signed)
Medical screening examination/treatment/procedure(s) were performed by non-physician practitioner and as supervising physician I was immediately available for consultation/collaboration.   EKG Interpretation None       Virgel Manifold, MD 05/02/14 [id number]

## 2014-05-02 NOTE — Evaluation (Signed)
Physical Therapy Evaluation Patient Details Name: Benjamin [name] MRN: [id number] DOB: August 28, 1951 Today's Date: 05/02/2014   History of Present Illness  Pt is a 63 year old male admitted after fall at home with R hip fx now s/p R IM nail and recent hx of L shoulder hemiarthroplasty by Dr. Onnie Graham, approx 9 weeks.  Pt reports sling discontinued however uncertain about WBing status of L UE.  Clinical Impression  Pt currently with functional limitations due to the deficits listed below (see PT Problem List).  Pt will benefit from skilled PT to increase their independence and safety with mobility to allow discharge to the venue listed below.  Pt recently returned home after rehab stay for L shoulder hemiarthroplasty and currently with limited mobility now due to [facility] on R LE since s/p R IM nail.     Follow Up Recommendations SNF    Equipment Recommendations  None recommended by PT    Recommendations for Other Services       Precautions / Restrictions Precautions Precautions: Fall Restrictions Weight Bearing Restrictions: Yes LUE Weight Bearing: Non weight bearing RLE Weight Bearing: Partial weight bearing Other Position/Activity Restrictions: ?NWB of L UE, per ortho PA - to check with Dr. Onnie Graham however wrote pt could be WBAT with RW in note      Mobility  Bed Mobility Overal bed mobility: Needs Assistance;+2 for physical assistance Bed Mobility: Supine to Sit;Sit to Supine     Supine to sit: +2 for physical assistance;Total assist Sit to supine: +2 for physical assistance;Total assist   General bed mobility comments: pt with increased difficulty assisting due to L UE precautions and painful R LE, verbal cues to attempt to assist however pt unable, required assist to scoot to EOB using bed pad  Transfers                 General transfer comment: pt felt unable to stand today due to pain  Ambulation/Gait                Stairs            Wheelchair Mobility     Modified Rankin (Stroke Patients Only)       Balance Overall balance assessment: Needs assistance Sitting-balance support: Single extremity supported;Feet supported Sitting balance-Leahy Scale: Poor Sitting balance - Comments: able to sit EOB min/guard however any weight shift pt required trunk control                                     Pertinent Vitals/Pain Premedicated, 8/10 R hip pain during movement, repositioned to comfort supine    Home Living Family/patient expects to be discharged to:: Skilled nursing facility Living Arrangements: Alone                    Prior Function Level of Independence: Independent               Hand Dominance        Extremity/Trunk Assessment   Upper Extremity Assessment: LUE deficits/detail       LUE Deficits / Details: pt reports no AROM yet, has been working on PROM with therapy   Lower Extremity Assessment: RLE deficits/detail RLE Deficits / Details: limited active motion due to pain, requires assist for R LE during bed mobility       Communication   Communication: No difficulties  Cognition Arousal/Alertness: Awake/alert Behavior During Therapy:  WFL for tasks assessed/performed Overall Cognitive Status: Within Functional Limits for tasks assessed                      General Comments      Exercises        Assessment/Plan    PT Assessment Patient needs continued PT services  PT Diagnosis Difficulty walking;Acute pain   PT Problem List Decreased strength;Decreased mobility;Pain;Decreased knowledge of precautions;Decreased balance;Decreased activity tolerance;Decreased knowledge of use of DME  PT Treatment Interventions DME instruction;Gait training;Functional mobility training;Therapeutic activities;Therapeutic exercise;Patient/family education;Balance training   PT Goals (Current goals can be found in the Care Plan section) Acute Rehab PT Goals PT Goal Formulation: With  patient Time For Goal Achievement: 05/09/14 Potential to Achieve Goals: Good    Frequency Min 3X/week   Barriers to discharge        Co-evaluation               End of Session   Activity Tolerance: Patient limited by pain Patient left: in bed;with call bell/phone within reach           Time: 1100-1121 PT Time Calculation (min): 21 min   Charges:   PT Evaluation $Initial PT Evaluation Tier I: 1 Procedure PT Treatments $Therapeutic Activity: 8-22 mins   PT G CodesJunius Argyle 05/02/2014, 2:06 PM Carmelia Bake, PT, DPT 05/02/2014 Pager: [phone]

## 2014-05-02 NOTE — Progress Notes (Signed)
[facility] PROGRESS NOTE  Barrett Goldie ALP:[id number] DOB: 12/16/1950 DOA: 05/01/2014 PCP: [name], MD  Assessment/Plan: Principal Problem:   Intertrochanteric fracture of right hip - Ortho on board and managing. - Will need continued PT  Active Problems:   ETOH abuse - No signs of withdrawal on exam. - Will encourage cessation    Anemia - mild, no active bleeding. Will have pcp follow    Type II or unspecified type diabetes mellitus with unspecified complication, uncontrolled - On Lantus and SSI - continue diabetic diet. - May increase lantus should blood sugars remain elevated.    Fall - mechanical fall, no further work up needed based on notes    Malnutrition of moderate degree - RD on board and recommending Boost TID  Code Status: full Family Communication: no family at bedside.  Disposition Plan: Pending improvement in condition   Consultants: - Orthopaedic surgeon  Procedures: 1 Day Post-Op Procedure(s) (LRB): RIGHT INTERTROCHANTERIC NAIL (Right)   Antibiotics:  None  HPI/Subjective: No acute issues reported overnight. No new complaints.  Objective: Filed Vitals:   05/02/14 1412  BP: 110/61  Pulse: 104  Temp: 98.3 F (36.8 C)  Resp: 18    Intake/Output Summary (Last 24 hours) at 05/02/14 1449 Last data filed at 05/02/14 1414  Gross per 24 hour  Intake 4706.67 ml  Output    825 ml  Net 3881.67 ml   Filed Weights   05/01/14 1737 05/01/14 2241  Weight: 61.4 kg (135 lb 5.8 oz) 62.4 kg (137 lb 9.1 oz)    Exam:   General:  Pt in NAD, alert and awake  Cardiovascular: RRR, no MRG  Respiratory: CTA BL, no wheezes  Abdomen: soft, NT, ND  Musculoskeletal: no clubbing, pain with movement of RLE  Data Reviewed: Basic Metabolic Panel:  Recent Labs Lab 05/01/14 1513 05/01/14 2320 05/02/14 1051  NA 136*  --  137  K 4.0  --  4.7  CL 93*  --  99  CO2 23  --  22  GLUCOSE 244*  --  247*  BUN 8  --  10  CREATININE 0.63 0.61  0.67  CALCIUM 8.7  --  7.9*   Liver Function Tests:  Recent Labs Lab 05/01/14 1902 05/02/14 1051  AST 31 23  ALT 44 28  ALKPHOS 280* 236*  BILITOT 0.4 0.3  PROT 6.0 5.2*  ALBUMIN 2.7* 2.1*   No results found for this basename: LIPASE, AMYLASE,  in the last 168 hours No results found for this basename: AMMONIA,  in the last 168 hours CBC:  Recent Labs Lab 05/01/14 1513  WBC 9.2  NEUTROABS 7.2  HGB 12.8*  HCT 37.3*  MCV 85.4  PLT 484*   Cardiac Enzymes: No results found for this basename: CKTOTAL, CKMB, CKMBINDEX, TROPONINI,  in the last 168 hours BNP (last 3 results) No results found for this basename: PROBNP,  in the last 8760 hours CBG:  Recent Labs Lab 05/01/14 1731 05/01/14 2104 05/02/14 0806 05/02/14 1129  GLUCAP 214* 181* 169* 269*    Recent Results (from the past 240 hour(s))  SURGICAL PCR SCREEN     Status: None   Collection Time    05/01/14  5:51 PM      Result Value Ref Range Status   MRSA, PCR NEGATIVE  NEGATIVE Final   Staphylococcus aureus NEGATIVE  NEGATIVE Final   Comment:            The Xpert SA Assay (FDA  approved for NASAL specimens     in patients over 60 years of age),     is one component of     a comprehensive surveillance     program.  Test performance has     been validated by [employer] for patients greater     than or equal to 56 year old.     It is not intended     to diagnose infection nor to     guide or monitor treatment.     Studies: Dg Chest 1 View  05/01/2014   CLINICAL DATA:  Pre-op for right hip arthroplasty.  EXAM: CHEST - 1 VIEW  COMPARISON:  Radiographs 02/28/2014 and 02/03/2014.  FINDINGS: 1501 hr. The heart size and mediastinal contours are stable. The lungs are clear with improved basilar aeration bilaterally. No pleural effusion or pneumothorax is demonstrated. There are prominent skin folds overlying the mid chest bilaterally. Patient is status post left shoulder arthroplasty.  IMPRESSION: No  active cardiopulmonary process.   Electronically Signed   By: Camie Patience M.D.   On: 05/01/2014 15:08   Dg Hip Complete Right  05/01/2014   CLINICAL DATA:  Fall, right hip pain.  EXAM: RIGHT HIP - COMPLETE 2+ VIEW  COMPARISON:  None.  FINDINGS: There is a right femoral intertrochanteric fracture with varus angulation. No subluxation or dislocation. Diffuse osteopenia. Mild degenerative changes in the hips bilaterally.  IMPRESSION: Right femoral intertrochanteric fracture with varus angulation.   Electronically Signed   By: Rolm Baptise M.D.   On: 05/01/2014 14:43   Dg Hip Operative Right  05/01/2014   CLINICAL DATA:  Internal fixation of right hip fracture.  EXAM: DG OPERATIVE RIGHT HIP  TECHNIQUE: Three spot fluoroscopic AP images of the right hip are submitted.  COMPARISON:  Right hip radiographs performed earlier today at 2:30 p.m.  FINDINGS: Three fluoroscopic C-arm images from the OR demonstrates interval placement of an intramedullary rod and screw, transfixing the intertrochanteric fracture through the proximal right femur in grossly anatomic alignment. No knee fractures are seen. The hardware appears grossly intact. The right femoral head remains seated at the acetabulum.  IMPRESSION: Successful internal fixation of right femoral intertrochanteric fracture, in grossly anatomic alignment.   Electronically Signed   By: Garald Balding M.D.   On: 05/01/2014 23:20   Dg Pelvis Portable  05/02/2014   CLINICAL DATA:  Postoperative radiograph; status post internal fixation of right hip fracture.  EXAM: PORTABLE PELVIS 1-2 VIEWS  COMPARISON:  Right hip radiographs performed earlier today at 2:30 p.m.  FINDINGS: There has been successful interval placement of an intramedullary rod and hip screw within the proximal right femur, transfixing the patient's right femoral intertrochanteric fracture in grossly anatomic alignment. No new fractures are seen. The right femoral head remains seated at the acetabulum. The  proximal left femur is grossly unremarkable in appearance.  The sacroiliac joints are unremarkable. Skin staples and soft tissue air are noted about the right hip. The visualized bowel gas pattern is grossly unremarkable.  IMPRESSION: Successful internal fixation of right femoral intertrochanteric fracture in grossly anatomic alignment.   Electronically Signed   By: Garald Balding M.D.   On: 05/02/2014 03:09    Scheduled Meds: . cloNIDine  0.1 mg Oral TID  . enoxaparin (LOVENOX) injection  30 mg Subcutaneous Q24H  . ferrous sulfate  325 mg Oral Q breakfast  . folic acid  1 mg Oral Daily  . furosemide  20 mg  Oral Daily  . insulin aspart  0-9 Units Subcutaneous TID WC  . insulin glargine  15 Units Subcutaneous QHS  . lactose free nutrition  237 mL Oral TID BM  . multivitamin with minerals  1 tablet Oral Daily  . sodium chloride  3 mL Intravenous Q12H  . thiamine  100 mg Oral Daily  . vitamin B-12  250 mcg Oral Daily  . vitamin C  500 mg Oral Daily   Continuous Infusions: . sodium chloride 100 mL/hr at 05/01/14 2301  . lactated ringers Stopped (05/01/14 2100)     Time spent: > 35 minutes    [place] [facility] Pager [phone]. If 7PM-7AM, please contact night-coverage at [email or web address], password [facility] 05/02/2014, 2:49 PM  LOS: 1 day

## 2014-05-02 NOTE — Progress Notes (Addendum)
Subjective: 1 Day Post-Op Procedure(s) (LRB): RIGHT INTERTROCHANTERIC NAIL (Right) Patient reports pain as moderate.  Notes pain right hip and thigh, worse with movement. He was seen by PT this AM but they are questioning his weightbearing status on his LUE. He is s/p L shoulder hemiarthroplasty by Dr. Onnie Graham, approx 9 weeks. He was last seen in the office 2 weeks ago and sling was D/C'd.  Objective: Vital signs in last 24 hours: Temp:  [97.6 F (36.4 C)-98.8 F (37.1 C)] 97.6 F (36.4 C) (05/28 7169) Pulse Rate:  [97-133] 97 (05/28 0614) Resp:  [12-22] 16 (05/28 0614) BP: (94-131)/(51-85) 94/51 mmHg (05/28 0614) SpO2:  [96 %-100 %] 100 % (05/28 0614) Weight:  [61.4 kg (135 lb 5.8 oz)-62.4 kg (137 lb 9.1 oz)] 62.4 kg (137 lb 9.1 oz) (05/27 2241)  Intake/Output from previous day: 05/27 0701 - 05/28 0700 In: 4106.7 [P.O.:120; I.V.:3781.7; IV Piggyback:205] Out: 400 [Urine:250; Blood:150] Intake/Output this shift:     Recent Labs  05/01/14 1513  HGB 12.8*    Recent Labs  05/01/14 1513  WBC 9.2  RBC 4.37  HCT 37.3*  PLT 484*    Recent Labs  05/01/14 1513 05/01/14 2320  NA 136*  --   K 4.0  --   CL 93*  --   CO2 23  --   BUN 8  --   CREATININE 0.63 0.61  GLUCOSE 244*  --   CALCIUM 8.7  --     Recent Labs  05/01/14 2320  INR 1.03    Neurologically intact ABD soft Neurovascular intact Sensation intact distally Intact pulses distally Dorsiflexion/Plantar flexion intact Incision: dressing C/D/I and no drainage No cellulitis present Compartment soft no calf pain or sign of DVT  Assessment/Plan: 1 Day Post-Op Procedure(s) (LRB): RIGHT INTERTROCHANTERIC NAIL (Right) Advance diet Up with therapy D/C IV fluids PT, PWB RLE Will discuss weightbearing status LUE with Dr. Onnie Graham- ok to weightbear LUE to use a walker Lovenox for DVT ppx May benefit from nutritional consult Will order CMET to eval LFT's given his alcoholism Discussed with Dr.  Josiah Lobo. Bissell 05/02/2014, 9:51 AM

## 2014-05-02 NOTE — Care Management Note (Addendum)
    Page 1 of 1   05/03/2014     2:23:00 PM CARE MANAGEMENT NOTE 05/03/2014  Patient:  Benjamin [name], Benjamin [name]   Account Number:  [id number]  Date Initiated:  05/02/2014  Documentation initiated by:  Dessa Phi  Subjective/Objective Assessment:   63 Y/O M ADMITTED W/FALL.R HIP FX.     Action/Plan:   FROM HOME,ALONE.ACTIVE W/[facility] HHRN/PT.HAS CANE,RW.   Anticipated DC Date:  05/03/2014   Anticipated DC Plan:  [place]  CM consult      Choice offered to / List presented to:             Status of service:  Completed, signed off Medicare Important Message given?   (If response is "NO", the following Medicare IM given date fields will be blank) Date Medicare IM given:   Date Additional Medicare IM given:    Discharge Disposition:  [place]  Per UR Regulation:  Reviewed for med. necessity/level of care/duration of stay  If discussed at Long Length of Stay Meetings, dates discussed:    Comments:  05/03/14 Davena Julian RN,BSN NCM [phone] D/C SNF.  05/02/14 Khaden Gater RN,BSN CM [phone] S/P IM NAIL R HIP.PT/OT CONS.SPOKE TO [facility] REP [place] W/HHRN/PT.PT-SNF.SW ALREADY FOLLOWING.

## 2014-05-02 NOTE — Op Note (Signed)
NAMEMELCHIZEDEK, ESPINOLA                  ACCOUNT NO.:  [id number]  MEDICAL RECORD NO.:  [id number]  LOCATION:  9470                         FACILITY:  [facility]  PHYSICIAN:  Susa Day, M.D.    DATE OF BIRTH:  09/19/1951  DATE OF PROCEDURE:  05/01/2014 DATE OF DISCHARGE:                              OPERATIVE REPORT   PREOPERATIVE DIAGNOSIS:  Right intertrochanteric hip fracture.  POSTOPERATIVE DIAGNOSIS:  Right intertrochanteric hip fracture.  PROCEDURE PERFORMED:  Intramedullary nailing of the right femur, intertrochanteric, utilizing a [employer] short trochanteric entry nail.  ANESTHESIA:  General.  ASSISTANT:  Surgical tech.  HISTORY:  A 63 year old, fell, fractured, indicated for surgical fixation.  Risk and benefits were discussed including bleeding, infection, DVT, PE, etc.  TECHNIQUE:  With the patient in supine position after the induction of adequate general anesthesia, 2 g of Kefzol and 900 mg of clindamycin, he was placed in the fracture table.  All bony prominences were well padded.  Right lower extremity, longitudinal traction and internal rotation, well leg padded, externally rotated, Foley to gravity.  Right hip peritrochanteric region was prepped and draped in usual sterile fashion.  Incision was made proximal to the trochanter.  Subcutaneous tissue was dissected.  Electrocautery was utilized to achieve hemostasis.  The fascia lata was identified and divided in line with skin incision.  Tip of the trochanter identified, entered with a guidepin, reamed it proximally and we selected 11 short nail 130-degree, inserted it into the intramedullary canal after we overreamed it proximally.  We seated it appropriately.  In the external alignment guide, we made two separate incisions in the skin, bluntly dissected down to the femur and drilled a guidepin up into the femoral head and neck.  We reamed it to a 95 and then inserted a lag screw 95 mm in length with excellent  purchase verifying in the AP and lateral plane, removing the guidepin.  We locked it distally statically with blunt dissection down to the femur after stab incision.  Drilling bicortically and placing a 36-mm lag screw with excellent purchase transfixing the rod.  In the AP and lateral plane, excellent reduction of the fracture, excellent placement of the intramedullary guide.  Both wounds were copiously irrigated, closed the fascia with #1 Vicryl, subcu with 2-0 Vicryl, skin with staples.  Wound was dressed sterilely.  Prior to that and after the insertion of the rod, we locked it proximally and then removed the external alignment guide.  The patient was then removed from the fracture table.  Leg lengths were equivalent.  Pulse was intact.  Good rotation.  Extubated and transported to the recovery room in satisfactory condition.  The patient tolerated the procedure well.  No complications.  Blood loss 50 mL.     Susa Day, M.D.     Geralynn Rile  D:  05/01/2014  T:  05/02/2014  Job:  [id number]

## 2014-05-02 NOTE — Progress Notes (Signed)
CSW spoke with patient's brother, Benjamin [name] via phone re: discharge planning. Patient had been to [facility] recently and they hope that he will be able to return there when ready. CSW confirmed with [facility] that they will be able to take patient once insurance authorization has been obtained.   Awaiting PT/OT evaluations to submit to [facility] for authorization.   [name], [facility] Clinical Social Worker cell #: [phone]

## 2014-05-03 LAB — BASIC METABOLIC PANEL
BUN: 11 mg/dL (ref 6–23)
CALCIUM: 7.8 mg/dL — AB (ref 8.4–10.5)
CO2: 25 mEq/L (ref 19–32)
Chloride: 103 mEq/L (ref 96–112)
Creatinine, Ser: 0.74 mg/dL (ref 0.50–1.35)
GFR calc Af Amer: >90 mL/min (ref >90)
GFR calc non Af Amer: >90 mL/min (ref >90)
GLUCOSE: 126 mg/dL — AB (ref 70–99)
POTASSIUM: 4.1 meq/L (ref 3.7–5.3)
SODIUM: 138 meq/L (ref 137–147)

## 2014-05-03 LAB — GLUCOSE, CAPILLARY
GLUCOSE-CAPILLARY: 81 mg/dL (ref 70–99)
Glucose-Capillary: 171 mg/dL — ABNORMAL HIGH (ref 70–99)
Glucose-Capillary: 195 mg/dL — ABNORMAL HIGH (ref 70–99)

## 2014-05-03 LAB — CBC
HCT: 25.4 % — ABNORMAL LOW (ref 39.0–52.0)
HEMOGLOBIN: 8.2 g/dL — AB (ref 13.0–17.0)
MCH: 29.4 pg (ref 26.0–34.0)
MCHC: 33.1 g/dL (ref 30.0–36.0)
MCV: 88.8 fL (ref 78.0–100.0)
Platelets: 258 10*3/uL (ref 150–400)
RBC: 2.86 MIL/uL — ABNORMAL LOW (ref 4.22–5.81)
RDW: 17.2 % — AB (ref 11.5–15.5)
WBC: 5.3 10*3/uL (ref 4.0–10.5)

## 2014-05-03 NOTE — Progress Notes (Signed)
Clinical Social Work  CSW faxed OT notes for [employer] for authorization and left a message with insurance re: OT needs at SNF. Authorization # [id number] received. CSW faxed DC summary to [facility] who is agreeable to accept today. CSW prepared DC packet with FL2 and hard scripts included. CSW informed patient, brother and RN of DC plans. Brother reports he wants to support patient during SNF stay and requested referrals for substance abuse treatment for patient after he completes rehab at [facility]. CSW provided referrals for [facility], [facility], [facility] outpatient, and [facility]. Brother reports he will encourage patient to go to treatment once he is able to ambulate again. CSW coordinated transportation via [facility] for 3:30pm pick up. Request #: [id number].  CSW is signing off but available if needed.  [place], [place] [phone]

## 2014-05-03 NOTE — Progress Notes (Signed)
Subjective: 2 Days Post-Op Procedure(s) (LRB): RIGHT INTERTROCHANTERIC NAIL (Right) Patient reports pain as mild. Improved pain since yesterday. No other c/o.   Objective: Vital signs in last 24 hours: Temp:  [98.2 F (36.8 C)-98.4 F (36.9 C)] 98.4 F (36.9 C) (05/29 0514) Pulse Rate:  [61-104] 98 (05/29 0514) Resp:  [16-18] 16 (05/29 0514) BP: (110-132)/(61-68) 120/65 mmHg (05/29 0514) SpO2:  [98 %-100 %] 98 % (05/29 0514)  Intake/Output from previous day: 05/28 0701 - 05/29 0700 In: 2160 [P.O.:960; I.V.:1200] Out: 1375 [Urine:1375] Intake/Output this shift:     Recent Labs  05/01/14 1513 05/03/14 0347  HGB 12.8* 8.2*    Recent Labs  05/01/14 1513 05/03/14 0347  WBC 9.2 5.3  RBC 4.37 2.86*  HCT 37.3* 25.4*  PLT 484* 258    Recent Labs  05/02/14 1051 05/03/14 0347  NA 137 138  K 4.7 4.1  CL 99 103  CO2 22 25  BUN 10 11  CREATININE 0.67 0.74  GLUCOSE 247* 126*  CALCIUM 7.9* 7.8*    Recent Labs  05/01/14 2320  INR 1.03    Neurologically intact ABD soft Neurovascular intact Sensation intact distally Intact pulses distally Dorsiflexion/Plantar flexion intact Incision: dressing C/D/I and no drainage No cellulitis present Compartment soft no calf pain or sign of DVT  Assessment/Plan: 2 Days Post-Op Procedure(s) (LRB): RIGHT INTERTROCHANTERIC NAIL (Right) Advance diet Up with therapy D/C IV fluids Continue PWB RLE May weightbear on LUE with walker for ambulation Lovenox for DVT ppx D/C when stable per medical service Follow up 10-14 days post-op Will discuss with Dr. Josiah Lobo. B[place]ell 05/03/2014, 7:43 AM

## 2014-05-03 NOTE — Progress Notes (Signed)
Physical Therapy Treatment Patient Details Name: Benjamin [name] MRN: [id number] DOB: Apr 02, 1951 Today's Date: 05/03/2014    History of Present Illness Pt is a 63 year old male admitted after fall at home with R hip fx now s/p R IM nail and recent hx of L shoulder hemiarthroplasty by Dr. Onnie Graham, approx 9 weeks. Per MD PN pt may "weight bear thru L UE to use walker".    PT Comments    POD # 2 L IM nail 2nd Fx/fall. Assisted pt OOB to amb limited distance + 2 assist for safety.  Pt required increased time and VC's for safety and proper gait sequencing.  Very unsteady/shaky/nervous gait.  Pt was only able to amb 2 feet forward, then 2 feet backward back to bed.  Pt will need ST Rehab at SNF prior to going home.    Follow Up Recommendations  SNF [facility])     Equipment Recommendations       Recommendations for Other Services       Precautions / Restrictions Precautions Precautions: Fall Restrictions Weight Bearing Restrictions: Yes LUE Weight Bearing:  (WBAT with RW) RLE Weight Bearing: Partial weight bearing    Mobility  Bed Mobility Overal bed mobility: Needs Assistance;+2 for physical assistance Bed Mobility: Supine to Sit;Sit to Supine     Supine to sit: +2 for physical assistance;Max assist Sit to supine: +2 for physical assistance;Total assist   General bed mobility comments: pt assisted with LEs getting OOB; cues for sequence  Transfers Overall transfer level: Needs assistance Equipment used: Rolling walker (2 wheeled) Transfers: Sit to/from Stand Sit to Stand: +2 physical assistance;Mod assist         General transfer comment: assist to rise and steady and place LUE on walker  Ambulation/Gait Ambulation/Gait assistance: +2 physical assistance;+2 safety/equipment;Max assist Ambulation Distance (Feet): 4 Feet (2 feet forward then 2 feet backward) Assistive device: Rolling walker (2 wheeled) Gait Pattern/deviations: Step-to pattern;Decreased stance time -  right;Trunk flexed;Narrow base of support Gait velocity: decreased   General Gait Details: Very shaky/unsteady/nervous gait.  Pt required 75% VC's on proper sequencing, steppage and proper use RW.  Instructed on 50% WBing.  Instructed on upright posture.  Great difficulty weightshifting and advancing either LE.  Max anxiety/fear.   Stairs            Wheelchair Mobility    Modified Rankin (Stroke Patients Only)       Balance                                    Cognition Arousal/Alertness: Awake/alert Behavior During Therapy: WFL for tasks assessed/performed Overall Cognitive Status: Within Functional Limits for tasks assessed                      Exercises      General Comments        Pertinent Vitals/Pain C/o 8/10 pain  Pre medicated    Home Living Family/patient expects to be discharged to:: Skilled nursing facility                    Prior Function Level of Independence: Independent          PT Goals (current goals can now be found in the care plan section) Acute Rehab PT Goals Patient Stated Goal: rehab Progress towards PT goals: Progressing toward goals    Frequency  Min 3X/week  PT Plan      Co-evaluation   Reason for Co-Treatment: For patient/therapist safety PT goals addressed during session: Mobility/safety with mobility OT goals addressed during session: ADL's and self-care     End of Session Equipment Utilized During Treatment: Gait belt Activity Tolerance: Patient limited by pain Patient left: in bed;with call bell/phone within reach;with bed alarm set     Time: 1349-1413 PT Time Calculation (min): 24 min  Charges:  $Gait Training: 8-22 mins $Therapeutic Activity: 8-22 mins                    G Codes:      Rica Koyanagi  PTA [facility]  Acute  Rehab Pager      [phone]

## 2014-05-03 NOTE — Discharge Summary (Signed)
Physician Discharge Summary  Benjamin [name] [id number] DOB: [birth year] DOA: 05/01/2014  PCP: [name], MD  Admit date: 05/01/2014 Discharge date: 05/03/2014  Time spent: > 35 minutes  Recommendations for Outpatient Follow-up:  1. Please be sure to follow up with your orthopaedic surgeon within the next 2 weeks  Discharge Diagnoses:  Principal Problem:   Intertrochanteric fracture of right hip Active Problems:   ETOH abuse   Anemia   Type II or unspecified type diabetes mellitus with unspecified complication, uncontrolled   Fall   Malnutrition of moderate degree   Discharge Condition: stable  Diet recommendation: Carb modified  Filed Weights   05/01/14 1737 05/01/14 2241  Weight: 61.4 kg (135 lb 5.8 oz) 62.4 kg (137 lb 9.1 oz)    History of present illness:  Benjamin [name] is a 63 y.o. male with past medical history of diabetes, hypertension and history of alcohol abuse. Patient had a recent left shoulder hemiarthroplasty done by Dr. supple on 02/28/2014. Patient came in he slipped on a wet floor and fell.  Was found to have right intertrochanteric fracture with varus angulation   Hospital Course:   Principal Problem:   Intertrochanteric fracture of right hip  - Ortho on board while patient in house and recommended the following: Advance diet  Up with therapy  D/C IV fluids  Continue PWB RLE  May weightbear on LUE with walker for ambulation  Lovenox for DVT ppx  D/C when stable per medical service  Follow up 10-14 days post-op  Will discuss with Dr. Tonita Cong  - Will need continued PT at SNF  Active Problems:   ETOH abuse  - No signs of withdrawal on exam.  - Will encourage cessation   Anemia  - mild, no active bleeding. Will have pcp follow   Type II or unspecified type diabetes mellitus with unspecified complication, uncontrolled  - On Lantus and Humalog - continue diabetic diet.   Malnutrition of moderate degree  - RD on board and recommending Boost  TID   Procedures: 2 Days Post-Op Procedure(s) (LRB):  RIGHT INTERTROCHANTERIC NAIL (Right)  Consultations:  Orthopeadic surgery: Dr. Phoebe Sharps   Discharge Exam: Filed Vitals:   05/03/14 1330  BP: 136/72  Pulse: 98  Temp: 98.4 F (36.9 C)  Resp: 16    General: Pt in NAD, alert and awake Cardiovascular: RRR, no MRG Respiratory: CTA BL, no wheezes  Discharge Instructions You were cared for by a hospitalist during your hospital stay. If you have any questions about your discharge medications or the care you received while you were in the hospital after you are discharged, you can call the unit and asked to speak with the hospitalist on call if the hospitalist that took care of you is not available. Once you are discharged, your primary care physician will handle any further medical issues. Please note that NO REFILLS for any discharge medications will be authorized once you are discharged, as it is imperative that you return to your primary care physician (or establish a relationship with a primary care physician if you do not have one) for your aftercare needs so that they can reassess your need for medications and monitor your lab values.  Discharge Instructions   Call MD for:  severe uncontrolled pain    Complete by:  As directed      Call MD for:  temperature >100.4    Complete by:  As directed      Diet - low sodium heart healthy  Complete by:  As directed      Increase activity slowly    Complete by:  As directed      Partial weight bearing    Complete by:  As directed   % Body Weight:  50  Laterality:  right  Extremity:  Lower            Medication List    STOP taking these medications       oxyCODONE 5 MG immediate release tablet  Commonly known as:  Oxy IR/ROXICODONE      TAKE these medications       acetaminophen 500 MG tablet  Commonly known as:  TYLENOL  Take 500 mg by mouth every 6 (six) hours as needed for mild pain.     cloNIDine 0.1 MG tablet   Commonly known as:  CATAPRES  Take 0.1 mg by mouth 3 (three) times daily.     docusate sodium 100 MG capsule  Commonly known as:  COLACE  Take 1 capsule (100 mg total) by mouth 2 (two) times daily as needed for mild constipation.     enoxaparin 30 MG/0.3ML injection  Commonly known as:  LOVENOX  Inject 0.3 mLs (30 mg total) into the skin daily.     ferrous sulfate 325 (65 FE) MG tablet  Take 325 mg by mouth daily with breakfast.     folic acid 1 MG tablet  Commonly known as:  FOLVITE  Take 1 mg by mouth daily.     furosemide 20 MG tablet  Commonly known as:  LASIX  Take 20 mg by mouth daily.     HYDROcodone-acetaminophen 5-325 MG per tablet  Commonly known as:  NORCO/VICODIN  Take 1 tablet by mouth every 4 (four) hours as needed.     insulin glargine 100 UNIT/ML injection  Commonly known as:  LANTUS  Inject 15 Units into the skin at bedtime.     insulin lispro 100 UNIT/ML injection  Commonly known as:  HUMALOG  Inject 2-8 Units into the skin 3 (three) times daily before meals. Dose is based on what he is going to eat.     lactose free nutrition Liqd  Take 237 mLs by mouth 3 (three) times daily between meals.     multivitamin with minerals Tabs tablet  Take 1 tablet by mouth daily.     thiamine 100 MG tablet  Take 1 tablet (100 mg total) by mouth daily.     vitamin C 500 MG tablet  Commonly known as:  ASCORBIC ACID  Take 500 mg by mouth daily.     zolpidem 5 MG tablet  Commonly known as:  AMBIEN  Take 1 tablet (5 mg total) by mouth at bedtime as needed for sleep.       Allergies  Allergen Reactions  . Tetanus Toxoids Rash       Follow-up Information   Follow up with BEANE,JEFFREY C, MD In 2 weeks.   Specialty:  Orthopedic Surgery   Contact information:   [address] [place] [address] [place] [address] [phone]        The results of significant diagnostics from this hospitalization (including imaging, microbiology, ancillary and  laboratory) are listed below for reference.    Significant Diagnostic Studies: Dg Chest 1 View  05/01/2014   CLINICAL DATA:  Pre-op for right hip arthroplasty.  EXAM: CHEST - 1 VIEW  COMPARISON:  Radiographs 02/28/2014 and 02/03/2014.  FINDINGS: 1501 hr. The heart size and mediastinal contours are stable.  The lungs are clear with improved basilar aeration bilaterally. No pleural effusion or pneumothorax is demonstrated. There are prominent skin folds overlying the mid chest bilaterally. Patient is status post left shoulder arthroplasty.  IMPRESSION: No active cardiopulmonary process.   Electronically Signed   By: Camie Patience M.D.   On: 05/01/2014 15:08   Dg Hip Complete Right  05/01/2014   CLINICAL DATA:  Fall, right hip pain.  EXAM: RIGHT HIP - COMPLETE 2+ VIEW  COMPARISON:  None.  FINDINGS: There is a right femoral intertrochanteric fracture with varus angulation. No subluxation or dislocation. Diffuse osteopenia. Mild degenerative changes in the hips bilaterally.  IMPRESSION: Right femoral intertrochanteric fracture with varus angulation.   Electronically Signed   By: Rolm Baptise M.D.   On: 05/01/2014 14:43   Dg Hip Operative Right  05/01/2014   CLINICAL DATA:  Internal fixation of right hip fracture.  EXAM: DG OPERATIVE RIGHT HIP  TECHNIQUE: Three spot fluoroscopic AP images of the right hip are submitted.  COMPARISON:  Right hip radiographs performed earlier today at 2:30 p.m.  FINDINGS: Three fluoroscopic C-arm images from the OR demonstrates interval placement of an intramedullary rod and screw, transfixing the intertrochanteric fracture through the proximal right femur in grossly anatomic alignment. No knee fractures are seen. The hardware appears grossly intact. The right femoral head remains seated at the acetabulum.  IMPRESSION: Successful internal fixation of right femoral intertrochanteric fracture, in grossly anatomic alignment.   Electronically Signed   By: Garald Balding M.D.   On:  05/01/2014 23:20   Dg Pelvis Portable  05/02/2014   CLINICAL DATA:  Postoperative radiograph; status post internal fixation of right hip fracture.  EXAM: PORTABLE PELVIS 1-2 VIEWS  COMPARISON:  Right hip radiographs performed earlier today at 2:30 p.m.  FINDINGS: There has been successful interval placement of an intramedullary rod and hip screw within the proximal right femur, transfixing the patient's right femoral intertrochanteric fracture in grossly anatomic alignment. No new fractures are seen. The right femoral head remains seated at the acetabulum. The proximal left femur is grossly unremarkable in appearance.  The sacroiliac joints are unremarkable. Skin staples and soft tissue air are noted about the right hip. The visualized bowel gas pattern is grossly unremarkable.  IMPRESSION: Successful internal fixation of right femoral intertrochanteric fracture in grossly anatomic alignment.   Electronically Signed   By: Garald Balding M.D.   On: 05/02/2014 03:09   Dg Shoulder Left  04/18/2014   CLINICAL DATA:  Anterior left shoulder pain after a fall.  EXAM: LEFT SHOULDER - 2+ VIEW  COMPARISON:  DG SHOULDER *L* dated 02/05/2014; DG SHOULDER*L*PORT dated 01/28/2014  FINDINGS: Since the prior study, there has been interval placement of a left shoulder arthroplasty. There is some bone fragmentation around the arthroplasty components which probably represents residual fracture fragments but without the benefit of comparison studies, acute fracture or loosening is not entirely excluded. There is no dislocation of the shoulder. Proximal portion of the component appears well seated.  IMPRESSION: Postoperative change in the left shoulder with displaced fragments, likely chronic but without the benefit of postoperative comparison studies, loosening or settling cannot be excluded.   Electronically Signed   By: Lucienne Capers M.D.   On: 04/18/2014 05:49    Microbiology: Recent Results (from the past 240 hour(s))   SURGICAL PCR SCREEN     Status: None   Collection Time    05/01/14  5:51 PM      Result Value Ref Range Status  MRSA, PCR NEGATIVE  NEGATIVE Final   Staphylococcus aureus NEGATIVE  NEGATIVE Final   Comment:            The Xpert SA Assay (FDA     approved for NASAL specimens     in patients over 62 years of age),     is one component of     a comprehensive surveillance     program.  Test performance has     been validated by [employer] for patients greater     than or equal to 70 year old.     It is not intended     to diagnose infection nor to     guide or monitor treatment.     Labs: Basic Metabolic Panel:  Recent Labs Lab 05/01/14 1513 05/01/14 2320 05/02/14 1051 05/03/14 0347  NA 136*  --  137 138  K 4.0  --  4.7 4.1  CL 93*  --  99 103  CO2 23  --  22 25  GLUCOSE 244*  --  247* 126*  BUN 8  --  10 11  CREATININE 0.63 0.61 0.67 0.74  CALCIUM 8.7  --  7.9* 7.8*   Liver Function Tests:  Recent Labs Lab 05/01/14 1902 05/02/14 1051  AST 31 23  ALT 44 28  ALKPHOS 280* 236*  BILITOT 0.4 0.3  PROT 6.0 5.2*  ALBUMIN 2.7* 2.1*   No results found for this basename: LIPASE, AMYLASE,  in the last 168 hours No results found for this basename: AMMONIA,  in the last 168 hours CBC:  Recent Labs Lab 05/01/14 1513 05/03/14 0347  WBC 9.2 5.3  NEUTROABS 7.2  --   HGB 12.8* 8.2*  HCT 37.3* 25.4*  MCV 85.4 88.8  PLT 484* 258   Cardiac Enzymes: No results found for this basename: CKTOTAL, CKMB, CKMBINDEX, TROPONINI,  in the last 168 hours BNP: BNP (last 3 results) No results found for this basename: PROBNP,  in the last 8760 hours CBG:  Recent Labs Lab 05/02/14 0806 05/02/14 1129 05/02/14 2106 05/03/14 0720 05/03/14 1143  GLUCAP 169* 269* 171* 81 195*       Signed:  Hayleigh Bawa  [facility] 05/03/2014, 1:37 PM

## 2014-05-03 NOTE — Progress Notes (Signed)
Report called to [facility] and given to Santiago Glad, Therapist, sports. Pt on PTAR stretcher and getting on elevator for discharge at this time.

## 2014-05-03 NOTE — Evaluation (Addendum)
Occupational Therapy Evaluation Patient Details Name: Benjamin [name] MRN: [id number] DOB: 04/11/1951 Today's Date: 05/03/2014    History of Present Illness Pt is a 63 year old male admitted after fall at home with R hip fx now s/p R IM nail and recent hx of L shoulder hemiarthroplasty by Dr. Onnie Graham, approx 9 weeks. Per MD PN pt may "weight bear thru L UE to use walker".   Clinical Impression   This 63 year old man is s/p R IM nail and is WBAT on RLE.  He is approximately 9 weeks out from L TSA and is WBAT on RW with LUE.  Pt will benefit from skilled OT to increase safety and independence with adls.  Plan is for d/c to SNF today.  If pt remains, we will set ADL goals and follow up with Dr Onnie Graham about protocol to continue with L shoulder rehab.    Follow Up Recommendations  SNF    Equipment Recommendations  3 in 1 bedside comode    Recommendations for Other Services       Precautions / Restrictions Precautions Precautions: Fall Restrictions Weight Bearing Restrictions: Yes LUE Weight Bearing:  (WBAT with RW) RLE Weight Bearing: Partial weight bearing      Mobility Bed Mobility         Supine to sit: +2 for physical assistance;Max assist Sit to supine: +2 for physical assistance;Total assist   General bed mobility comments: pt assisted with LEs getting OOB; cues for sequence  Transfers Overall transfer level: Needs assistance Equipment used: Rolling walker (2 wheeled) Transfers: Sit to/from Stand Sit to Stand: +2 physical assistance;Mod assist         General transfer comment: assist to rise and steady and place LUE on walker    Balance                                            ADL Overall ADL's : Needs assistance/impaired     Grooming: Set up;Sitting   Upper Body Bathing: Moderate assistance;Sitting   Lower Body Bathing: +2 for physical assistance;Maximal assistance;Sit to/from stand   Upper Body Dressing : Moderate assistance;Sitting    Lower Body Dressing: +2 for physical assistance;Total assistance;Sit to/from stand                 General ADL Comments: pt with limited use of LUE for adls--9 weeks s/p TSA.  Has only been doing pendulum exercises.  Educated on AE for adls, but did not introduce this session.  Pt will be able to use reacher, as he is R dominant     Vision                     Perception     Praxis      Pertinent Vitals/Pain 8 R hip and 6 L shoulder.  Repositioned.  Pt declined ice; had just been removed.     Hand Dominance Right   Extremity/Trunk Assessment Upper Extremity Assessment Upper Extremity Assessment: LUE deficits/detail LUE Deficits / Details: shoulder, has only been doing pendulums; distally wfls AROM           Communication Communication Communication: No difficulties   Cognition Arousal/Alertness: Awake/alert Behavior During Therapy: WFL for tasks assessed/performed Overall Cognitive Status: Within Functional Limits for tasks assessed  General Comments       Exercises       Shoulder Instructions      Home Living Family/patient expects to be discharged to:: Skilled nursing facility                                        Prior Functioning/Environment Level of Independence: Independent             OT Diagnosis: Generalized weakness   OT Problem List: Decreased strength;Decreased range of motion;Decreased activity tolerance;Decreased knowledge of use of DME or AE;Pain;Impaired UE functional use   OT Treatment/Interventions: Self-care/ADL training;DME and/or AE instruction;Patient/family education;Therapeutic exercise;Balance training;Therapeutic activities (will clarify UE exercise with Dr Onnie Graham)    OT Goals(Current goals can be found in the care plan section) Acute Rehab OT Goals Patient Stated Goal: rehab OT Goal Formulation: With patient Time For Goal Achievement: 05/10/14 Potential to Achieve  Goals: Good ADL Goals Pt Will Perform Upper Body Bathing: with min assist;sitting Pt Will Perform Lower Body Bathing: with min assist;with adaptive equipment;sit to/from stand Pt Will Perform Lower Body Dressing: with min assist;with adaptive equipment;sit to/from stand (pants only) Pt Will Transfer to Toilet: with mod assist;stand pivot transfer;bedside commode  OT Frequency: Min 2X/week   Barriers to D/C:            Co-evaluation PT/OT/SLP Co-Evaluation/Treatment: Yes Reason for Co-Treatment: For patient/therapist safety PT goals addressed during session: Mobility/safety with mobility OT goals addressed during session: ADL's and self-care      End of Session    Activity Tolerance: Patient limited by pain Patient left: in bed;with call bell/phone within reach;with bed alarm set   Time: 1355 (1355)-1412 OT Time Calculation (min): 17 min Charges:  OT General Charges $OT Visit: 1 Procedure OT Evaluation $Initial OT Evaluation Tier I: 1 Procedure G-Codes:    Lesle Chris May 31, 2014, 2:41 PM Lesle Chris, OTR/L [phone] 31-May-2014

## 2014-05-03 NOTE — Progress Notes (Signed)
Clinical Social Work Department CLINICAL SOCIAL WORK PLACEMENT NOTE 05/03/2014  Patient:  Benjamin [name], Benjamin [name]  Account Number:  [id number] Admit date:  05/01/2014  Clinical Social Worker:  Sindy Messing, LCSW  Date/time:  05/02/2014 12:00 N  Clinical Social Work is seeking post-discharge placement for this patient at the following level of care:   [place]   (*CSW will update this form in Epic as items are completed)   05/02/2014  Patient/family provided with [facility] Department of Clinical Social Work's list of facilities offering this level of care within the geographic area requested by the patient (or if unable, by the patient's family).  05/02/2014  Patient/family informed of their freedom to choose among providers that offer the needed level of care, that participate in Medicare, Medicaid or managed care program needed by the patient, have an available bed and are willing to accept the patient.  05/02/2014  Patient/family informed of [facility]' ownership interest in [facility], as well as of the fact that they are under no obligation to receive care at this facility.  PASARR submitted to EDS on existing # PASARR number received from EDS on   FL2 transmitted to all facilities in geographic area requested by pt/family on  05/02/2014 FL2 transmitted to all facilities within larger geographic area on   Patient informed that his/her managed care company has contracts with or will negotiate with  certain facilities, including the following:     Patient/family informed of bed offers received:  05/02/2014 Patient chooses bed at [facility] Physician recommends and patient chooses bed at    Patient to be transferred to [facility] on  05/03/2014 Patient to be transferred to facility by [facility]  The following physician request were entered in Epic:   Additional Comments:

## 2014-05-05 ENCOUNTER — Encounter (HOSPITAL_COMMUNITY): Payer: Self-pay | Admitting: General Practice

## 2014-05-06 ENCOUNTER — Other Ambulatory Visit: Payer: Self-pay | Admitting: *Deleted

## 2014-05-06 ENCOUNTER — Non-Acute Institutional Stay (SKILLED_NURSING_FACILITY): Payer: PRIVATE HEALTH INSURANCE | Admitting: Internal Medicine

## 2014-05-06 DIAGNOSIS — E119 Type 2 diabetes mellitus without complications: Secondary | ICD-10-CM

## 2014-05-06 DIAGNOSIS — S72141A Displaced intertrochanteric fracture of right femur, initial encounter for closed fracture: Secondary | ICD-10-CM

## 2014-05-06 DIAGNOSIS — S72143A Displaced intertrochanteric fracture of unspecified femur, initial encounter for closed fracture: Secondary | ICD-10-CM

## 2014-05-06 DIAGNOSIS — I10 Essential (primary) hypertension: Secondary | ICD-10-CM

## 2014-05-06 DIAGNOSIS — D62 Acute posthemorrhagic anemia: Secondary | ICD-10-CM | POA: Insufficient documentation

## 2014-05-06 MED ORDER — ZOLPIDEM TARTRATE 5 MG PO TABS: ORAL_TABLET | ORAL | Status: AC

## 2014-05-06 NOTE — Progress Notes (Signed)
HISTORY & PHYSICAL  DATE: 05/06/2014   FACILITY: [facility]  LEVEL OF CARE: SNF (31)  ALLERGIES:  Allergies  Allergen Reactions  . Tetanus Toxoids Rash    CHIEF COMPLAINT:  Manage right hip fracture, diabetes mellitus and hypertension  HISTORY OF PRESENT ILLNESS: Patient is a 63 year old Caucasian male.  HIP FRACTURE: The patient had a mechanical fall and sustained a femur fracture.  Patient subsequently underwent surgical repair and tolerated the procedure well. Patient is admitted to this facility for short-term rehabilitation. Patient denies hip pain currently. No complications reported from the pain medications currently being used.  DM:pt's DM remains stable.  Pt denies polyuria, polydipsia, polyphagia, changes in vision or hypoglycemic episodes.  No complications noted from the medication presently being used.  Last hemoglobin A1c is: Not available.  HTN: Pt 's HTN remains stable.  Denies CP, sob, DOE, pedal edema, headaches, dizziness or visual disturbances.  No complications from the medications currently being used.  Last BP : 140/90.  PAST MEDICAL HISTORY :  Past Medical History  Diagnosis Date  . Diabetes mellitus   . Hypertension   . Alcohol abuse   . Pancreatitis     PAST SURGICAL HISTORY: Past Surgical History  Procedure Laterality Date  . Knee surgery    . Pancreas surgery    . Esophagogastroduodenoscopy N/A 06/19/2013    Procedure: ESOPHAGOGASTRODUODENOSCOPY (EGD);  Surgeon: Cleotis Nipper, MD;  Location: [facility] ENDOSCOPY;  Service: Endoscopy;  Laterality: N/A;  . Orif humerus fracture Left 02/05/2014    Procedure: OPEN REDUCTION INTERNAL FIXATION (ORIF) LEFT  PROXIMAL HUMERUS FRACTURE;  Surgeon: Marin Shutter, MD;  Location: [facility] ORS;  Service: Orthopedics;  Laterality: Left;  . Humeral hemiarthroplasty Left 02/28/2014    DR SUPPLE  . Shoulder hemi-arthroplasty Left 02/28/2014    Procedure: HEMI-ARTHROPLASTY ;  Surgeon: Marin Shutter, MD;  Location: [place];  Service: Orthopedics;  Laterality: Left;  . Femur im nail Right 05/01/2014    Procedure: RIGHT INTERTROCHANTERIC NAIL;  Surgeon: [name], MD;  Location: [facility];  Service: Orthopedics;  Laterality: Right;    SOCIAL HISTORY:  reports that he quit smoking about 8 months ago. His smoking use included Cigarettes. He has a 40 pack-year smoking history. He has never used smokeless tobacco. He reports that he drinks alcohol. He reports that he does not use illicit drugs.  FAMILY HISTORY:  Family History  Problem Relation Age of Onset  . Diabetes Mother   . Dementia Father     CURRENT MEDICATIONS: Reviewed per MAR/see medication list  REVIEW OF SYSTEMS:  See HPI otherwise 14 point ROS is negative.  PHYSICAL EXAMINATION  VS:  See VS section  GENERAL: no acute distress, normal body habitus EYES: conjunctivae normal, sclerae normal, normal eye lids MOUTH/THROAT: lips without lesions,no lesions in the mouth,tongue is without lesions,uvula elevates in midline NECK: supple, trachea midline, no neck masses, no thyroid tenderness, no thyromegaly LYMPHATICS: no LAN in the neck, no supraclavicular LAN RESPIRATORY: breathing is even & unlabored, BS CTAB CARDIAC: RRR, no murmur,no extra heart sounds, no edema GI:  ABDOMEN: abdomen soft, normal BS, no masses, no tenderness  LIVER/SPLEEN: no hepatomegaly, no splenomegaly MUSCULOSKELETAL: HEAD: normal to inspection  EXTREMITIES: LEFT UPPER EXTREMITY:  range of motion unable to perform due to shoulder problems, normal strength & tone RIGHT UPPER EXTREMITY:  full range of motion, normal strength & tone LEFT LOWER EXTREMITY:   range of motion  unable to perform, normal strength & tone RIGHT LOWER EXTREMITY:   range of motion not tested due to surgery, normal strength & tone PSYCHIATRIC: the patient is alert & oriented to person, affect & behavior appropriate  LABS/RADIOLOGY:  Labs reviewed: Basic Metabolic  Panel:  Recent Labs  01/31/14 0300  02/03/14 0551  02/06/14 0615  05/01/14 1513 05/01/14 2320 05/02/14 1051 05/03/14 0347  NA 144  < > 131*  < > 130*  < > 136*  --  137 138  K 3.1*  < > 3.2*  < > 3.5*  < > 4.0  --  4.7 4.1  CL 109  < > 95*  < > 96  < > 93*  --  99 103  CO2 25  < > 24  < > 23  < > 23  --  22 25  GLUCOSE 161*  < > 116*  < > 171*  < > 244*  --  247* 126*  BUN 22  < > 8  < > 7  < > 8  --  10 11  CREATININE 0.62  < > 0.49*  < > 0.50  < > 0.63 0.61 0.67 0.74  CALCIUM 8.1*  < > 7.7*  < > 7.9*  < > 8.7  --  7.9* 7.8*  MG 2.0  --  1.5  --  1.5  --   --   --   --   --   PHOS 1.3*  --  2.1*  --  3.0  --   --   --   --   --   < > = values in this interval not displayed. Liver Function Tests:  Recent Labs  02/28/14 0812 05/01/14 1902 05/02/14 1051  AST 19 31 23   ALT 15 44 28  ALKPHOS 204* 280* 236*  BILITOT 0.4 0.4 0.3  PROT 5.7* 6.0 5.2*  ALBUMIN 1.9* 2.7* 2.1*    Recent Labs  06/17/13 2136 01/28/14 2200  LIPASE 6* 238*    Recent Labs  01/29/14 0430  AMMONIA 69*   CBC:  Recent Labs  02/07/14 1945  02/28/14 0812 05/01/14 1513 05/03/14 0347  WBC 7.1  < > 10.9* 9.2 5.3  NEUTROABS 5.4  --  8.5* 7.2  --   HGB 11.0*  < > 11.6* 12.8* 8.2*  HCT 32.2*  < > 33.6* 37.3* 25.4*  MCV 90.2  < > 89.1 85.4 88.8  PLT 335  < > 543* 484* 258  < > = values in this interval not displayed.  Cardiac Enzymes:  Recent Labs  01/28/14 2200 01/29/14 0220 01/29/14 0831 01/29/14 1355 01/29/14 1838 01/30/14 0450  CKTOTAL 481*  --   --   --  620* 481*  TROPONINI  --  <0.30 <0.30 <0.30  --   --    CBG:  Recent Labs  05/02/14 2106 05/03/14 0720 05/03/14 1143  GLUCAP 171* 81 195*    RIGHT HIP - COMPLETE 2+ VIEW   COMPARISON:  None.   FINDINGS: There is a right femoral intertrochanteric fracture with varus angulation. No subluxation or dislocation. Diffuse osteopenia. Mild degenerative changes in the hips bilaterally.   IMPRESSION: Right femoral  intertrochanteric fracture with varus angulation.   CHEST - 1 VIEW   COMPARISON:  Radiographs 02/28/2014 and 02/03/2014.   FINDINGS: 1501 hr. The heart size and mediastinal contours are stable. The lungs are clear with improved basilar aeration bilaterally. No pleural effusion or pneumothorax is demonstrated. There are prominent skin  folds overlying the mid chest bilaterally. Patient is status post left shoulder arthroplasty.   IMPRESSION: No active cardiopulmonary process. DG OPERATIVE RIGHT HIP   TECHNIQUE: Three spot fluoroscopic AP images of the right hip are submitted.   COMPARISON:  Right hip radiographs performed earlier today at 2:30 p.m.   FINDINGS: Three fluoroscopic C-arm images from the OR demonstrates interval placement of an intramedullary rod and screw, transfixing the intertrochanteric fracture through the proximal right femur in grossly anatomic alignment. No knee fractures are seen. The hardware appears grossly intact. The right femoral head remains seated at the acetabulum.   IMPRESSION: Successful internal fixation of right femoral intertrochanteric fracture, in grossly anatomic alignment.   PORTABLE PELVIS 1-2 VIEWS   COMPARISON:  Right hip radiographs performed earlier today at 2:30 p.m.   FINDINGS: There has been successful interval placement of an intramedullary rod and hip screw within the proximal right femur, transfixing the patient's right femoral intertrochanteric fracture in grossly anatomic alignment. No new fractures are seen. The right femoral head remains seated at the acetabulum. The proximal left femur is grossly unremarkable in appearance.   The sacroiliac joints are unremarkable. Skin staples and soft tissue air are noted about the right hip. The visualized bowel gas pattern is grossly unremarkable.   IMPRESSION: Successful internal fixation of right femoral intertrochanteric fracture in grossly anatomic  alignment.   ASSESSMENT/PLAN:  Right intertrochanteric fracture-status post surgery. Continue rehabilitation. Diabetes mellitus-continue Lantus Hypertension-blood pressure borderline. Will monitor. Acute blood loss anemia-check hemoglobin level. Continue iron. Insomnia-continue Ambien Check CBC and BMP  I have reviewed patient's medical records received at admission/from hospitalization.  CPT CODE: 09983  Elowyn Raupp Y Alydia Gosser, [place] [phone]

## 2014-05-08 ENCOUNTER — Non-Acute Institutional Stay (SKILLED_NURSING_FACILITY): Payer: PRIVATE HEALTH INSURANCE | Admitting: Internal Medicine

## 2014-05-08 DIAGNOSIS — R609 Edema, unspecified: Secondary | ICD-10-CM

## 2014-05-08 DIAGNOSIS — D62 Acute posthemorrhagic anemia: Secondary | ICD-10-CM

## 2014-05-08 DIAGNOSIS — D473 Essential (hemorrhagic) thrombocythemia: Secondary | ICD-10-CM

## 2014-05-12 NOTE — Progress Notes (Signed)
Patient ID: Benjamin [name], male   DOB: 1951-02-07, 63 y.o.   MRN: [id number]            PROGRESS NOTE  DATE: 05/08/2014       FACILITY:  [facility]  LEVEL OF CARE: SNF (31)  Acute Visit  CHIEF COMPLAINT:  Manage acute blood loss anemia and left arm edema.      HISTORY OF PRESENT ILLNESS: I was requested by the staff to assess the patient regarding above problem(s):  ANEMIA: The anemia is unstable. The patient denies fatigue, melena or hematochezia. No complications from the medications currently being used.   On 05/07/2014:  Hemoglobin 9.8, MCV 91.  In 04/2014:  Hemoglobin 12.8.    LEFT ARM EDEMA:   New problem.  Patient reports that he has swelling in left arm, but he denies pain.      PAST MEDICAL HISTORY : Reviewed.  No changes/see problem list  CURRENT MEDICATIONS: Reviewed per MAR/see medication list  REVIEW OF SYSTEMS:  GENERAL: no change in appetite, no fatigue, no weight changes, no fever, chills or weakness RESPIRATORY: no cough, SOB, DOE,, wheezing, hemoptysis CARDIAC: no chest pain, edema or palpitations GI: no abdominal pain, diarrhea, constipation, heart burn, nausea or vomiting  PHYSICAL EXAMINATION  VS: see VS section  GENERAL: no acute distress, normal body habitus NECK: supple, trachea midline, no neck masses, no thyroid tenderness, no thyromegaly RESPIRATORY: breathing is even & unlabored, BS CTAB CARDIAC: RRR, no murmur,no extra heart sounds; left arm: no edema GI: abdomen soft, normal BS, no masses, no tenderness, no hepatomegaly, no splenomegaly PSYCHIATRIC: the patient is alert & oriented to person, affect & behavior appropriate   LABS/RADIOLOGY: On 05/07/2014:  Platelet count 438.    In 04/2014:  Platelet count 484.       ASSESSMENT/PLAN:  Acute blood loss anemia.  Unstable problem.  Hemoglobin has declined.  Continue iron.  Recheck in one week.    Left arm edema.  No edema noted today.  Monitor for now.  Likely dependent.     Thrombocytosis.  Acute phase reactant.   Platelet count has trended down.  We will monitor.    CPT CODE: 09311       Gayani Y Dasanayaka, [place] [phone]

## 2014-05-13 ENCOUNTER — Other Ambulatory Visit: Payer: Self-pay | Admitting: *Deleted

## 2014-05-13 ENCOUNTER — Non-Acute Institutional Stay (SKILLED_NURSING_FACILITY): Payer: PRIVATE HEALTH INSURANCE | Admitting: Internal Medicine

## 2014-05-13 DIAGNOSIS — D473 Essential (hemorrhagic) thrombocythemia: Secondary | ICD-10-CM | POA: Insufficient documentation

## 2014-05-13 DIAGNOSIS — E1165 Type 2 diabetes mellitus with hyperglycemia: Secondary | ICD-10-CM

## 2014-05-13 DIAGNOSIS — E118 Type 2 diabetes mellitus with unspecified complications: Principal | ICD-10-CM

## 2014-05-13 DIAGNOSIS — IMO0002 Reserved for concepts with insufficient information to code with codable children: Secondary | ICD-10-CM

## 2014-05-13 MED ORDER — HYDROCODONE-ACETAMINOPHEN 5-325 MG PO TABS: 1.0000 | ORAL_TABLET | ORAL | Status: AC | PRN

## 2014-05-13 NOTE — Telephone Encounter (Signed)
[facility] 

## 2014-05-15 ENCOUNTER — Non-Acute Institutional Stay (SKILLED_NURSING_FACILITY): Payer: PRIVATE HEALTH INSURANCE | Admitting: Internal Medicine

## 2014-05-15 DIAGNOSIS — D473 Essential (hemorrhagic) thrombocythemia: Secondary | ICD-10-CM

## 2014-05-15 DIAGNOSIS — D62 Acute posthemorrhagic anemia: Secondary | ICD-10-CM

## 2014-05-16 NOTE — Progress Notes (Signed)
Patient ID: Benjamin [name], male   DOB: [birth year], 63 y.o.   MRN: [id number]            PROGRESS NOTE  DATE: 05/13/2014        FACILITY:  [facility]  LEVEL OF CARE: SNF (31)  Acute Visit  CHIEF COMPLAINT:  Manage diabetes mellitus.    HISTORY OF PRESENT ILLNESS: I was requested by the staff to assess the patient regarding above problem(s):  DM:pt's DM is uncontrolled.  CBGs are in the 200-300 range, especially at lunch and suppertime.  Pt denies polyuria, polydipsia, polyphagia, changes in vision or hypoglycemic episodes.  No complications noted from the medication presently being used.  Last hemoglobin A1c is:   Not available.     PAST MEDICAL HISTORY : Reviewed.  No changes/see problem list  CURRENT MEDICATIONS: Reviewed per MAR/see medication list  REVIEW OF SYSTEMS:  GENERAL: no change in appetite, no fatigue, no weight changes, no fever, chills or weakness RESPIRATORY: no cough, SOB, DOE,, wheezing, hemoptysis CARDIAC: no chest pain, edema or palpitations GI: no abdominal pain, diarrhea, constipation, heart burn, nausea or vomiting  PHYSICAL EXAMINATION  VS:  T 99.2       P 60      RR 20      BP 140/90     POX 96%       WT (Lb) 143        GENERAL: no acute distress, normal body habitus NECK: supple, trachea midline, no neck masses, no thyroid tenderness, no thyromegaly RESPIRATORY: breathing is even & unlabored, BS CTAB CARDIAC: RRR, no murmur,no extra heart sounds, no edema GI: abdomen soft, normal BS, no masses, no tenderness, no hepatomegaly, no splenomegaly PSYCHIATRIC: the patient is alert & oriented to person, affect & behavior appropriate  ASSESSMENT/PLAN:  Diabetes mellitus.  Uncontrolled problem.  Increase Lantus to 18 U q.h.s.  Check hemoglobin A1c.    CPT CODE: 99242        Benjamin [name] Y Aundria Bitterman, [place] [phone]

## 2014-05-20 NOTE — Progress Notes (Signed)
Patient ID: Benjamin [name], male   DOB: [birth year], 63 y.o.   MRN: [id number]            PROGRESS NOTE  DATE: 05/15/2014        FACILITY:  [facility]  LEVEL OF CARE: SNF (31)  Acute Visit  CHIEF COMPLAINT:  Manage acute blood loss anemia and thrombocytosis.    HISTORY OF PRESENT ILLNESS: I was requested by the staff to assess the patient regarding above problem(s):  ANEMIA: The anemia has been stable. The patient denies fatigue, melena or hematochezia. No complications from the medications currently being used.  On 05/14/2014:  Hemoglobin 9.1, MCV 92.  In 04/2014:  Hemoglobin 8.2.    THROMBOCYTOSIS:  New problem.  On 05/14/2014:  Platelet count 1109.  In 04/2014:  Platelet count 258.  Patient denies headaches, weakness, numbness or tingling.    PAST MEDICAL HISTORY : Reviewed.  No changes/see problem list  CURRENT MEDICATIONS: Reviewed per MAR/see medication list  REVIEW OF SYSTEMS:  GENERAL: no change in appetite, no fatigue, no weight changes, no fever, chills or weakness RESPIRATORY: no cough, SOB, DOE,, wheezing, hemoptysis CARDIAC: no chest pain, edema or palpitations GI: no abdominal pain, diarrhea, constipation, heart burn, nausea or vomiting  PHYSICAL EXAMINATION  VS:  T 99.2       P 60      RR 20      BP 140/90     POX 96%       WT (Lb) 143        GENERAL: no acute distress, normal body habitus NECK: supple, trachea midline, no neck masses, no thyroid tenderness, no thyromegaly RESPIRATORY: breathing is even & unlabored, BS CTAB CARDIAC: RRR, no murmur,no extra heart sounds, no edema GI: abdomen soft, normal BS, no masses, no tenderness, no hepatomegaly, no splenomegaly PSYCHIATRIC: the patient is alert & oriented to person, affect & behavior appropriate  ASSESSMENT/PLAN:  Acute blood loss anemia.  Hemoglobin improved.     Thrombocytosis.  New problem.  Patient is asymptomatic.  We will monitor.    CPT CODE: 40981        Gayani Y Dasanayaka,  [place] [phone]

## 2014-05-31 ENCOUNTER — Non-Acute Institutional Stay (SKILLED_NURSING_FACILITY): Payer: PRIVATE HEALTH INSURANCE | Admitting: Internal Medicine

## 2014-05-31 DIAGNOSIS — E119 Type 2 diabetes mellitus without complications: Secondary | ICD-10-CM

## 2014-05-31 DIAGNOSIS — S72141S Displaced intertrochanteric fracture of right femur, sequela: Secondary | ICD-10-CM

## 2014-05-31 DIAGNOSIS — S72009S Fracture of unspecified part of neck of unspecified femur, sequela: Secondary | ICD-10-CM

## 2014-06-05 ENCOUNTER — Other Ambulatory Visit: Payer: Self-pay | Admitting: *Deleted

## 2014-06-05 NOTE — Progress Notes (Signed)
He is presently a patient at [facility] rehab center, medication are given thru the facility.

## 2014-06-10 ENCOUNTER — Other Ambulatory Visit: Payer: Self-pay | Admitting: *Deleted

## 2014-06-11 NOTE — Progress Notes (Addendum)
Patient ID: Benjamin [name], male   DOB: Feb 26, 1951, 63 y.o.   MRN: [id number]                 PROGRESS NOTE  DATE: 05/31/2014        FACILITY: [facility]  LEVEL OF CARE: SNF (31)  Discharge Visit  CHIEF COMPLAINT:  Manage right hip fracture and diabetes mellitus.    HISTORY OF PRESENT ILLNESS: I was requested by the social worker to perform face-to-face evaluation for discharge:  Patient was admitted to this facility for short-term rehabilitation after the patient's recent hospitalization.  Patient has completed SNF rehabilitation and therapy has cleared the patient for discharge.  Reassessment of ongoing problem(s):  HIP FRACTURE: The patient had a mechanical fall and sustained a femur fracture.  Patient subsequently underwent surgical repair and tolerated the procedure well. Patient is admitted to this facility for short-term rehabilitation. Patient denies hip pain currently. No complications reported from the pain medications currently being used.  Per Physical Therapy, the patient has done remarkably well.  He has an orthopedic appointment scheduled next week.    DM:pt's DM remains stable.  Pt denies polyuria, polydipsia, polyphagia, changes in vision or hypoglycemic episodes.  No complications noted from the medication presently being used.  Last hemoglobin A1c is:  6.7 in 05/2014.    PAST MEDICAL HISTORY : Reviewed.  No changes/see problem list  CURRENT MEDICATIONS: Reviewed per MAR/see medication list  REVIEW OF SYSTEMS:  GENERAL: no change in appetite, no fatigue, no weight changes, no fever, chills or weakness RESPIRATORY: no cough, SOB, DOE, wheezing, hemoptysis CARDIAC: no chest pain, edema or palpitations GI: no abdominal pain, diarrhea, constipation, heart burn, nausea or vomiting  PHYSICAL EXAMINATION  VS:  T 97       P 70      RR 18      BP 122/64     POX 96%       WT (Lb) 148.7        GENERAL: no acute distress, normal body habitus EYES:  conjunctivae normal, sclerae normal, normal eye lids NECK: supple, trachea midline, no neck masses, no thyroid tenderness, no thyromegaly LYMPHATICS: no LAN in the neck, no supraclavicular LAN RESPIRATORY: breathing is even & unlabored, BS CTAB CARDIAC: RRR, no murmur,no extra heart sounds, no edema GI: abdomen soft, normal BS, no masses, no tenderness, no hepatomegaly, no splenomegaly PSYCHIATRIC: the patient is alert & oriented to person, affect & behavior appropriate  ASSESSMENT/PLAN:  Right hip fracture.    Diabetes mellitus.     I have filled out patient's discharge paperwork and written prescriptions.  Patient will receive home health PT, OT, ST, nursing and CNA. DME provided:none  Total discharge time: Less than 30 minutes Discharge time involved coordination of the discharge process with social worker, nursing staff and therapy department. Medical justification for home health services/DME verified.  CPT CODE: 82500        Xian Apostol Y Sonika Levins, [place] [phone]

## 2014-09-05 DEATH — deceased

## 2014-12-22 IMAGING — CR DG CHEST 2V
2 series · 2 of 2 positions shown · non-contrast
Comparison: DG CHEST 1V PORT dated 02/03/2014;

CLINICAL DATA: Revision of ORIF left proximal humerus fracture,
possible hemi arthroplasty

EXAM:
CHEST  2 VIEW

[w chest pa]
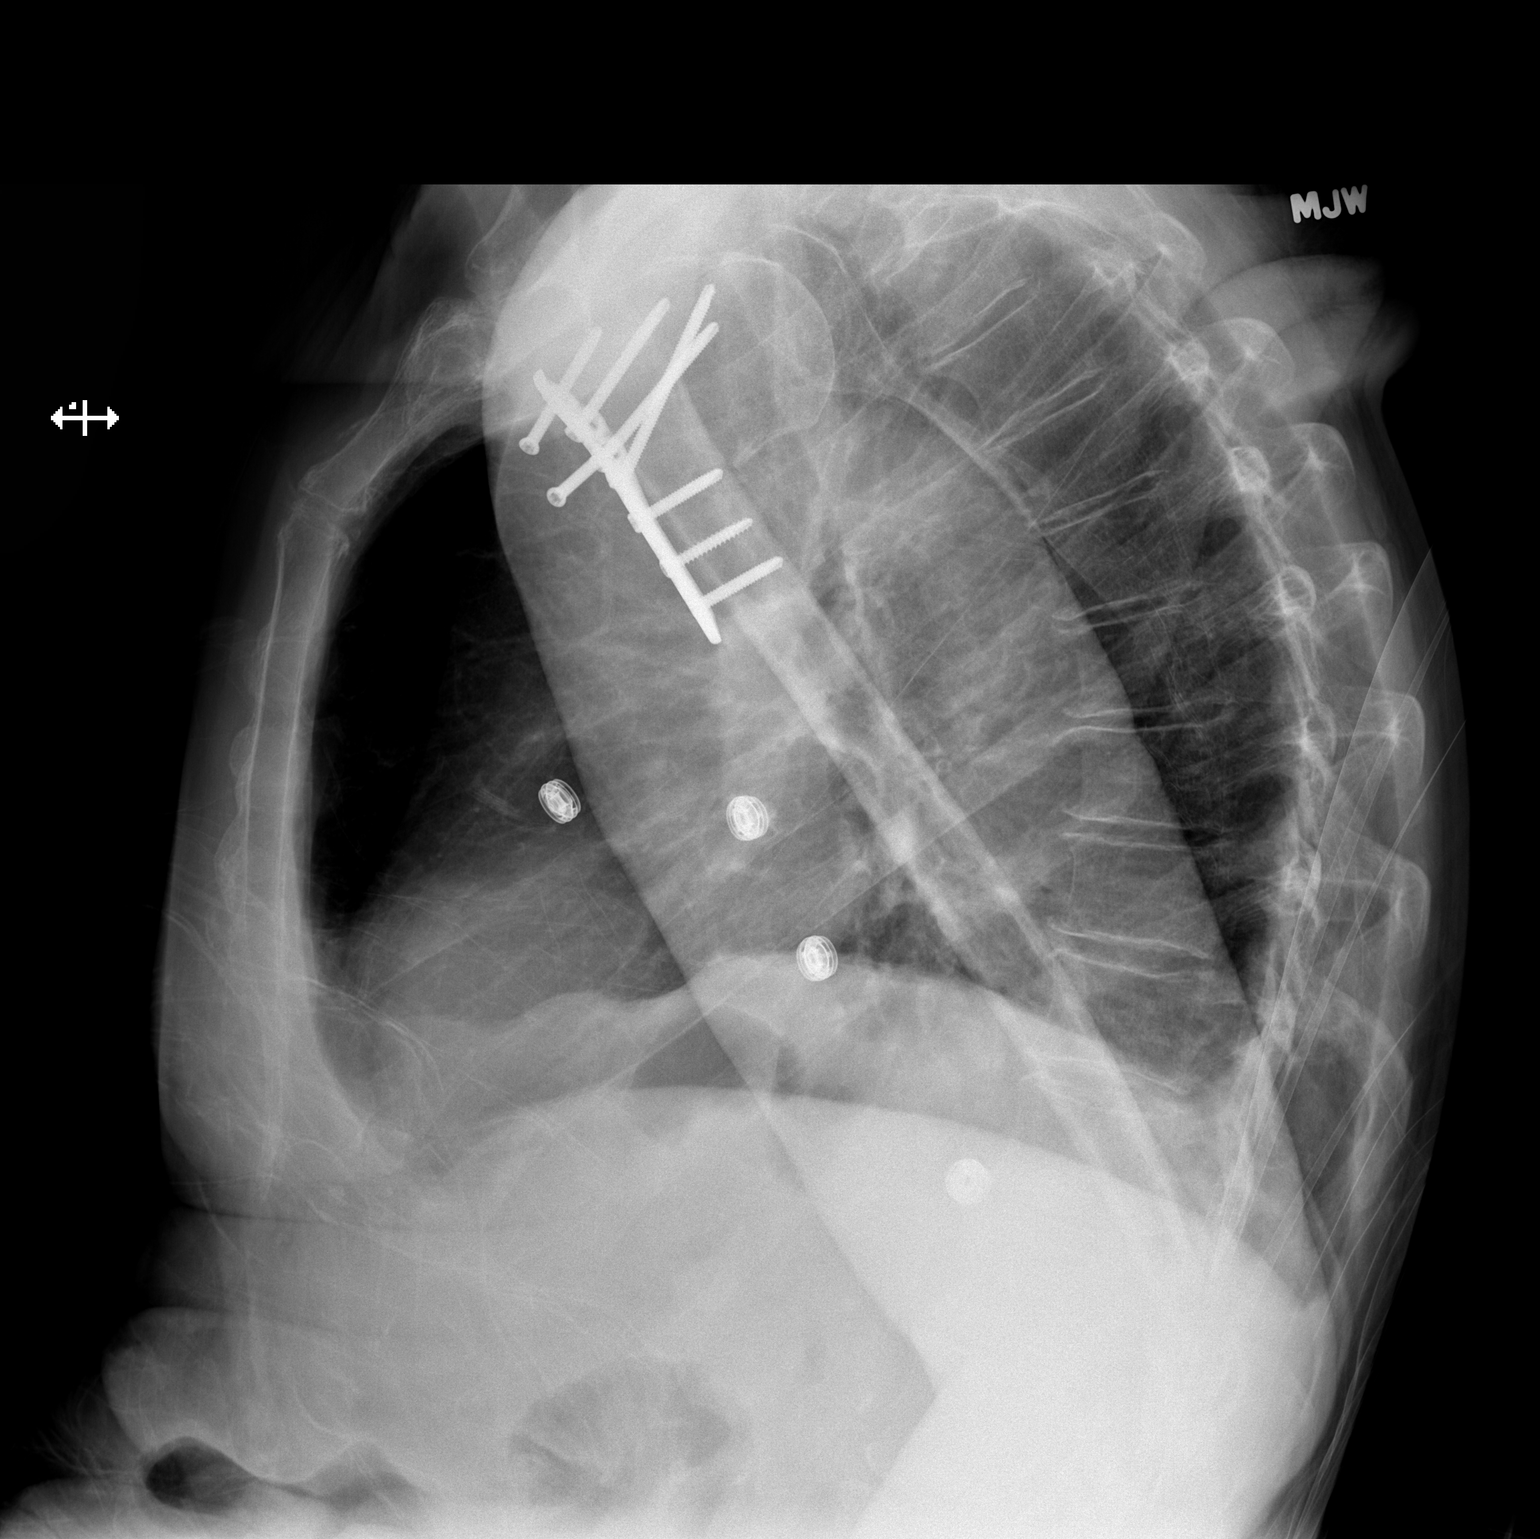

[x chest ap]
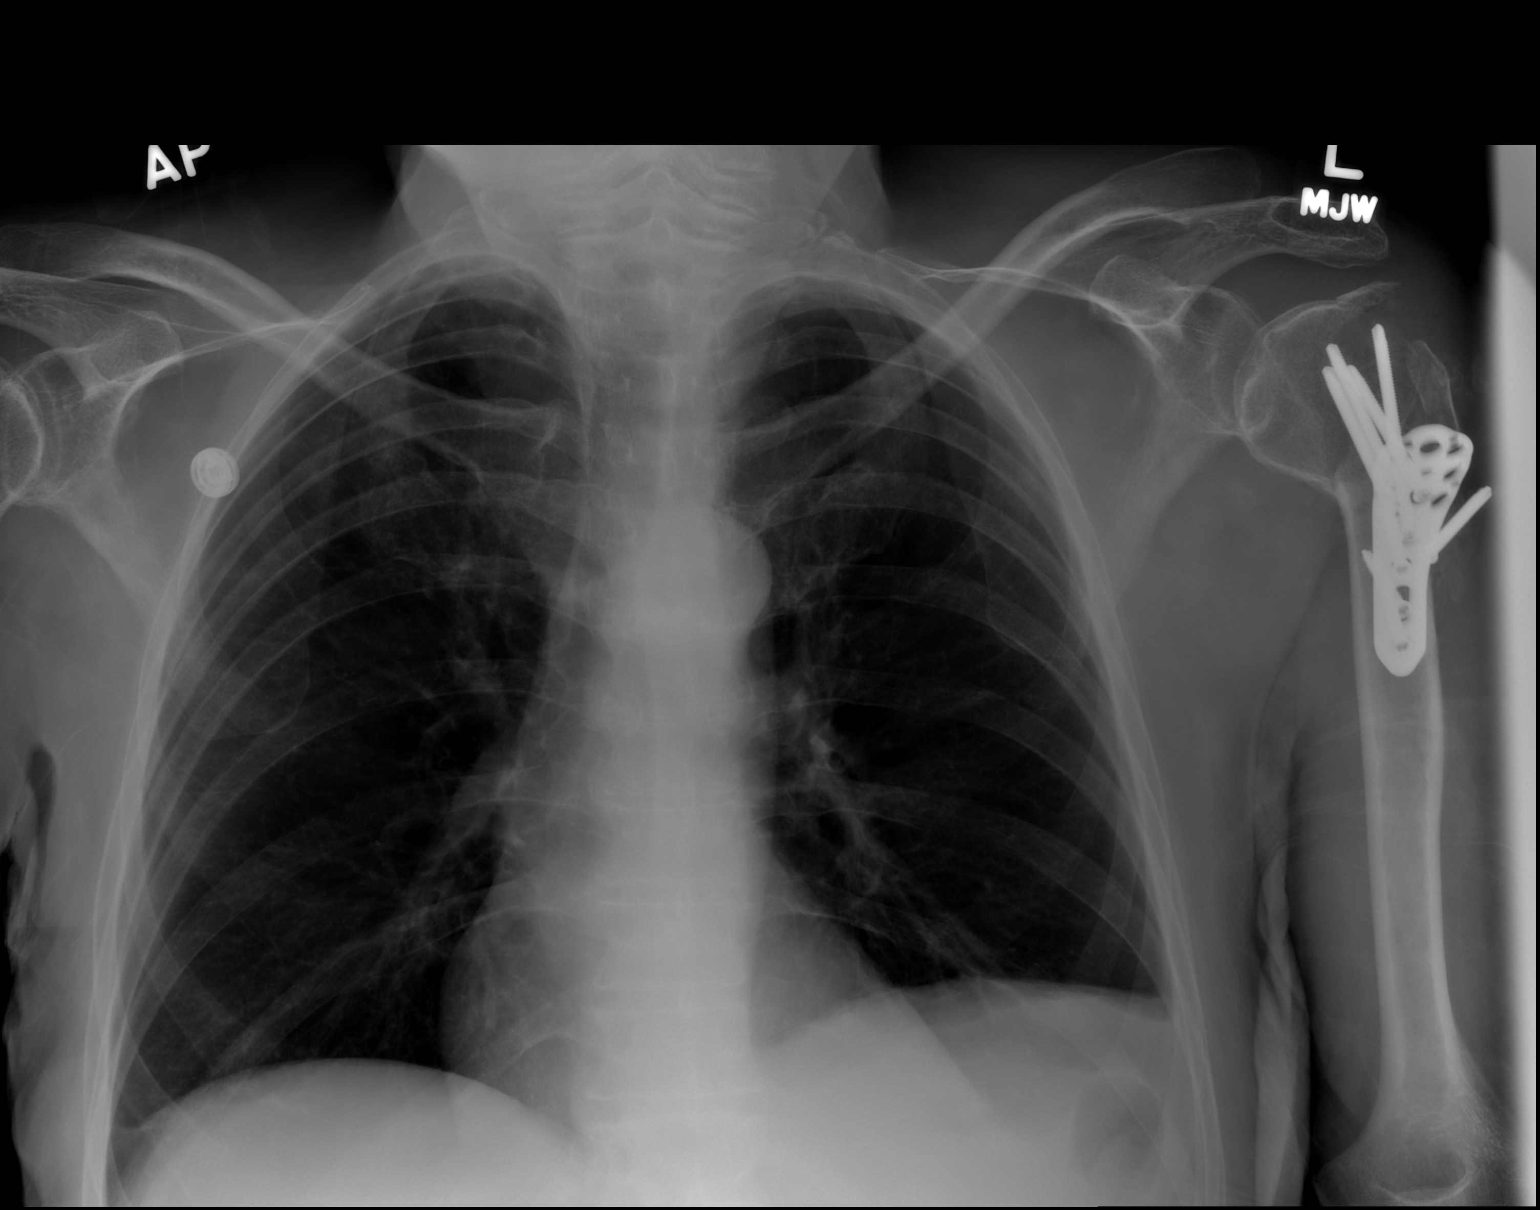

[2 of 2 positions shown; findings below may reference images not displayed]

DG CHEST 1V PORT dated
01/30/2014; CT CHEST W/O CM dated 01/31/2014; DG CHEST 1V PORT dated
01/28/2014
FINDINGS: Grossly unchanged cardiac silhouette and mediastinal contours.
Improved aeration of the lungs. No focal airspace opacities. There
is blunting of the bilateral costophrenic angles which may be
indicative of chronic trace bilateral effusions. No evidence of
edema. No pneumothorax. Grossly unchanged bones including the
sequela of sideplate fixation of persistently displaced complex
proximal humeral fracture, incompletely evaluated.
IMPRESSION: Chronic trace bilateral effusions without acute cardiopulmonary
disease. Specifically, no evidence of edema.

## 2015-02-22 IMAGING — CR DG HIP COMPLETE 2+V*R*
3 series · 3 of 3 positions shown · non-contrast
Comparison: None.

CLINICAL DATA: Fall, right hip pain.

EXAM:
RIGHT HIP - COMPLETE 2+ VIEW

[t pelvis ap]
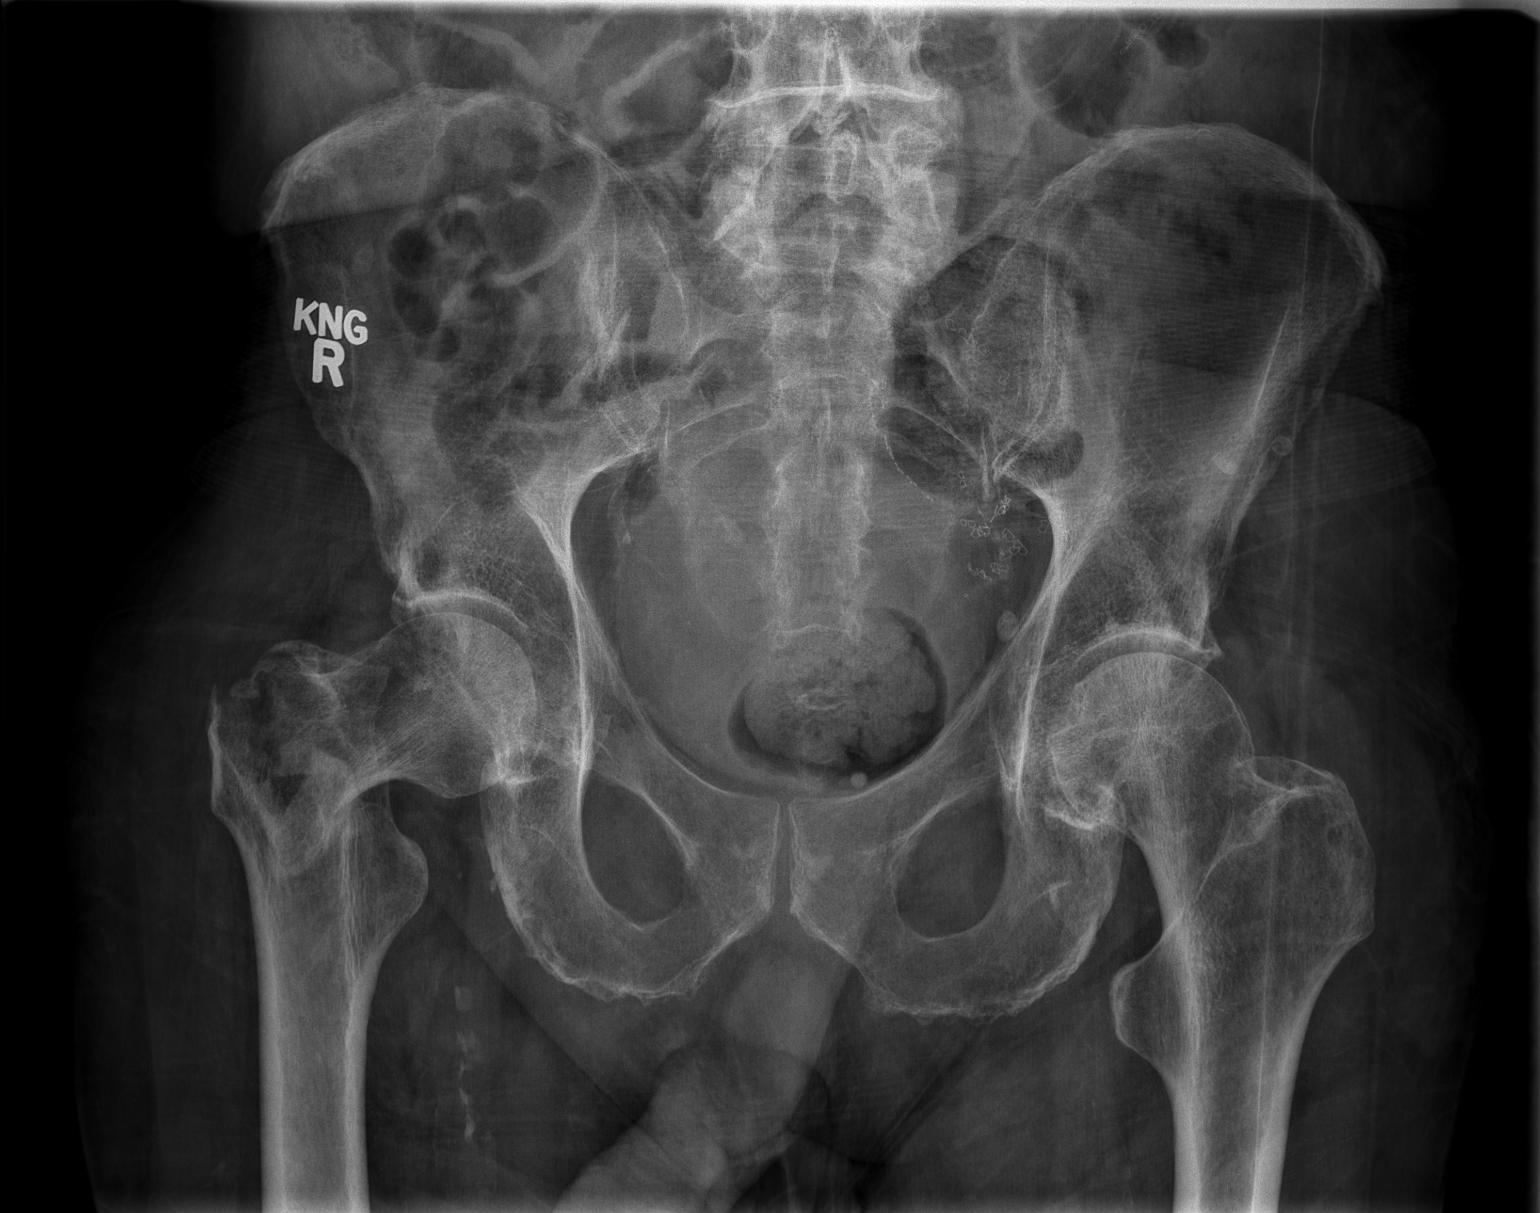

[t hip ap right]
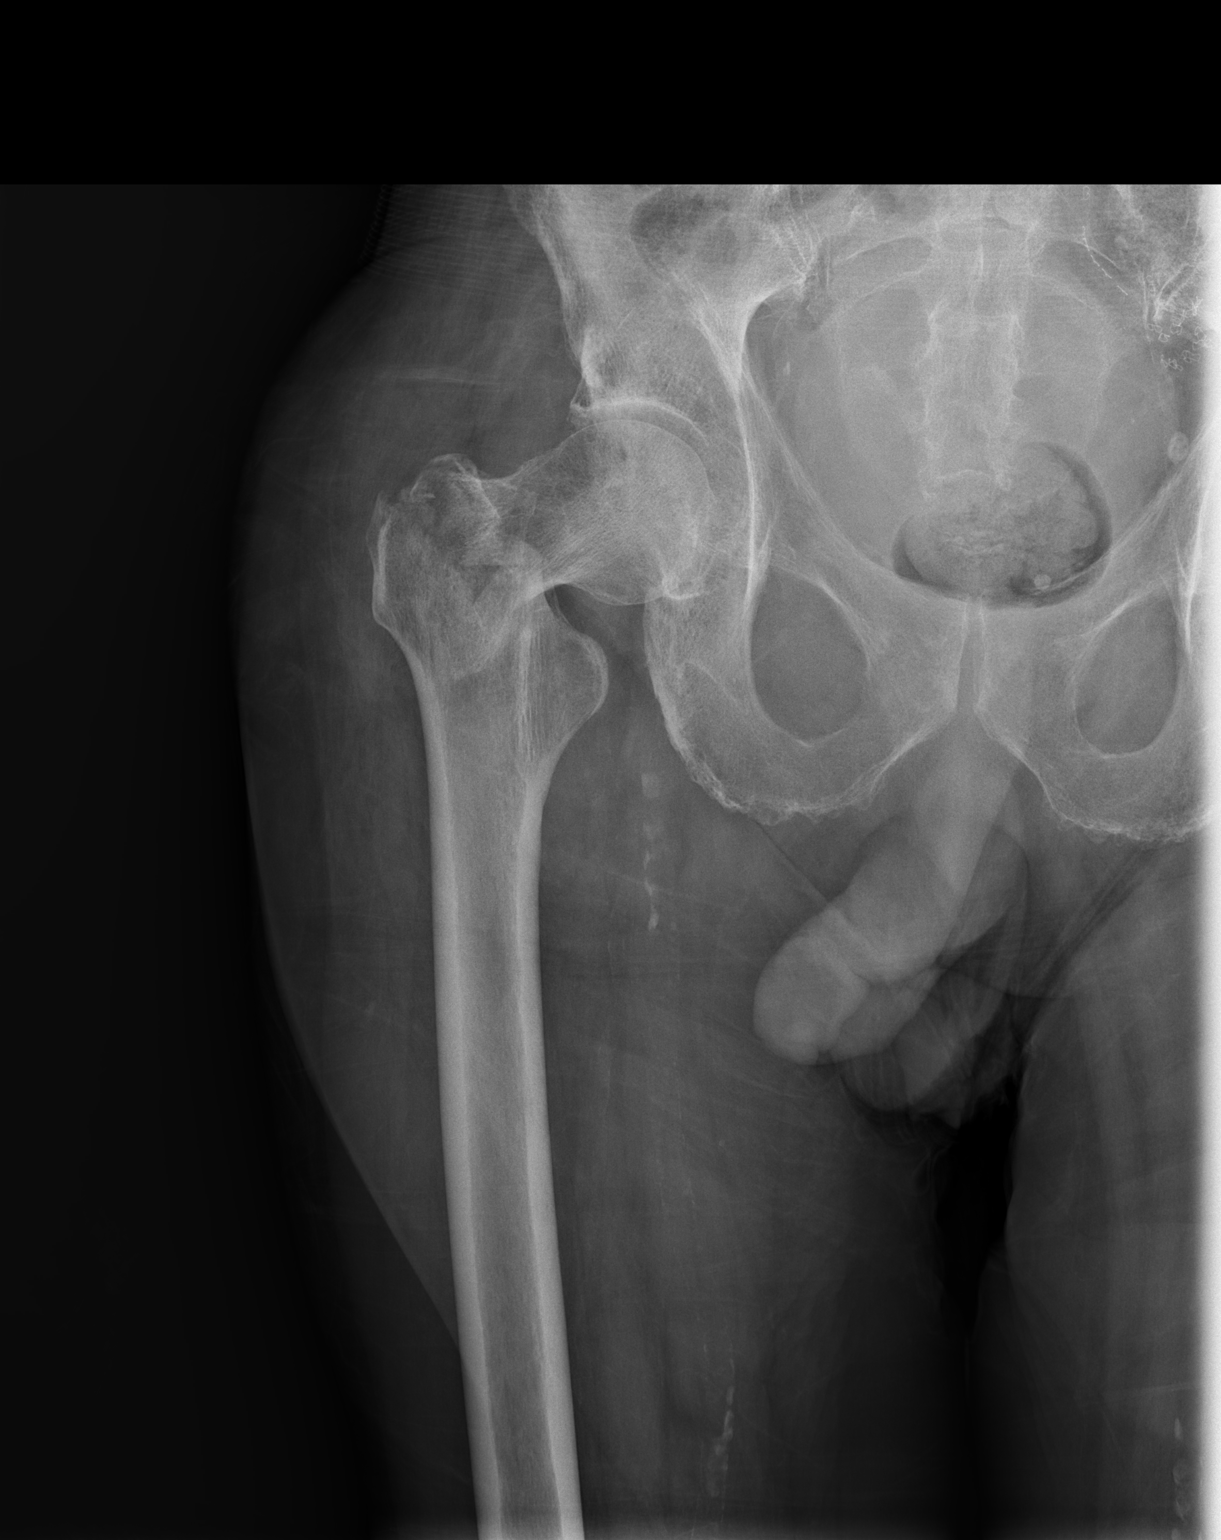

[t hip frog leg right]
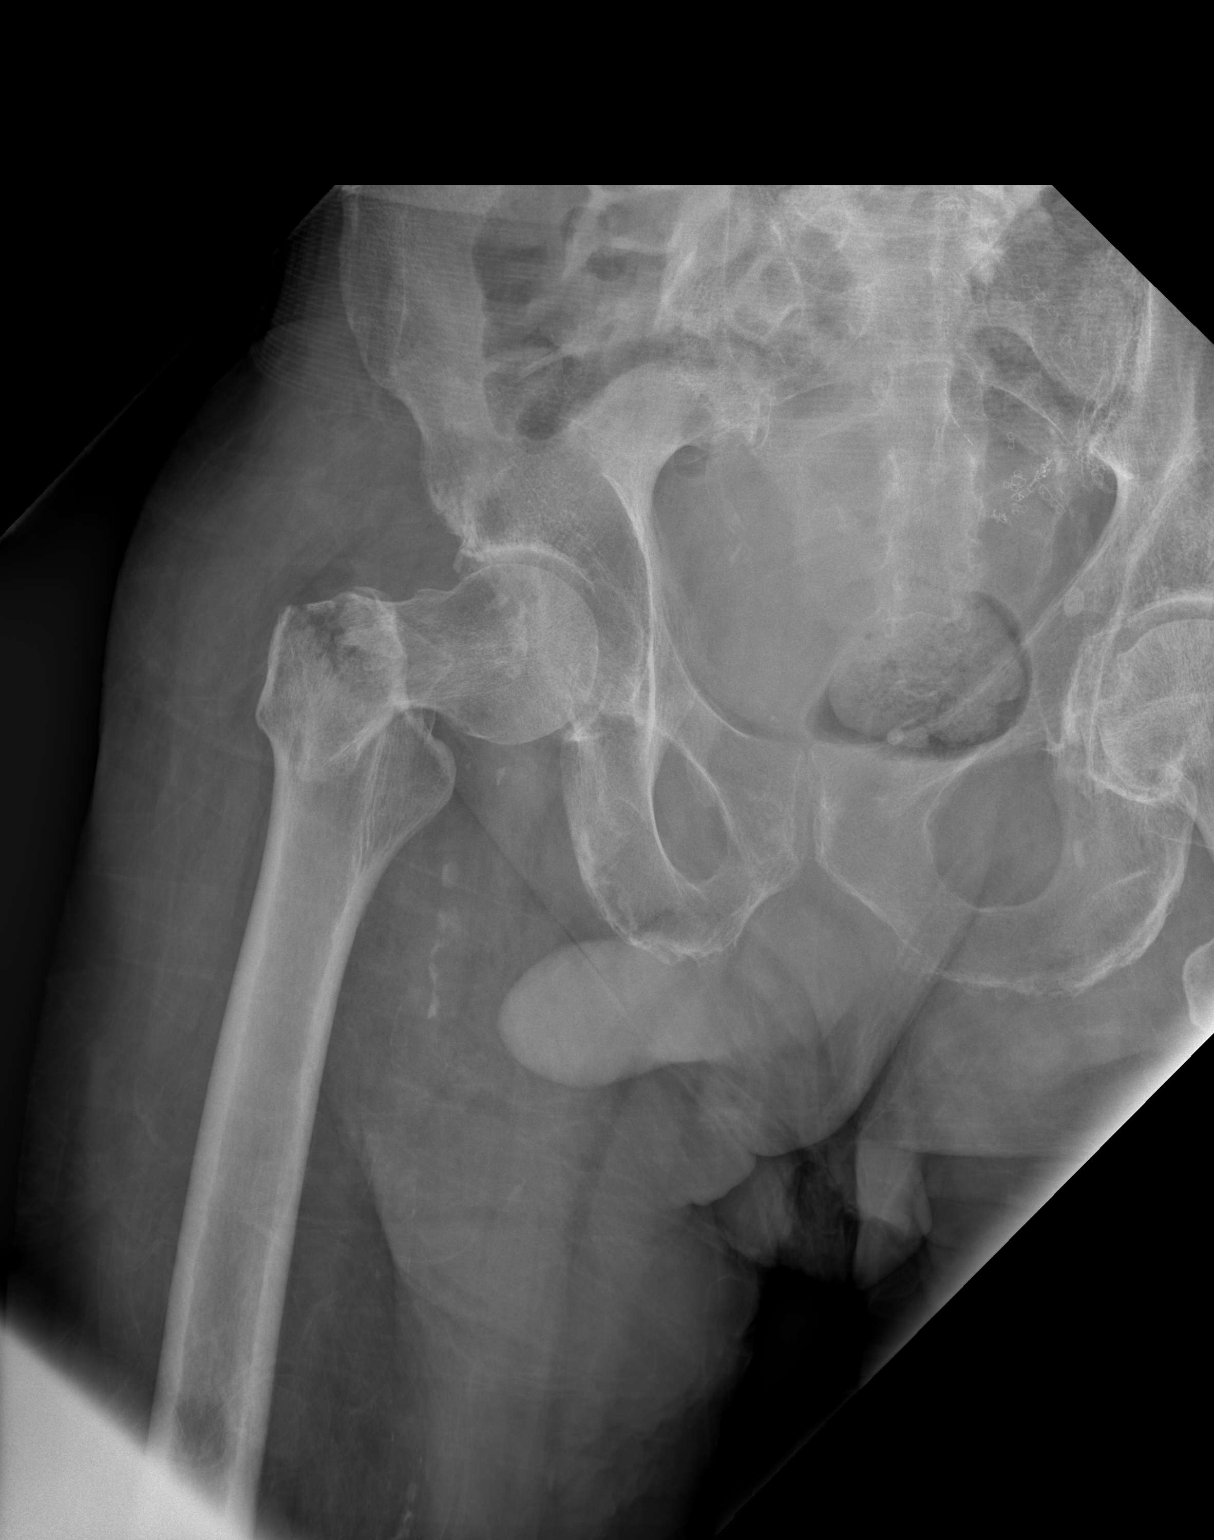

[3 of 3 positions shown; findings below may reference images not displayed]

FINDINGS: There is a right femoral intertrochanteric fracture with varus
angulation. No subluxation or dislocation. Diffuse osteopenia. Mild
degenerative changes in the hips bilaterally.
IMPRESSION: Right femoral intertrochanteric fracture with varus angulation.

## 2015-02-22 IMAGING — RF DG HIP OPERATIVE*R*
1 series · 3 of 3 positions shown · non-contrast
Comparison: Right hip radiographs performed earlier today at [DATE]
p.m.

CLINICAL DATA: Internal fixation of right hip fracture.

EXAM:
DG OPERATIVE RIGHT HIP
TECHNIQUE: Three spot fluoroscopic AP images of the right hip are submitted.

[Series 1: run · 3 of 3 slices shown]
[im 1/3]
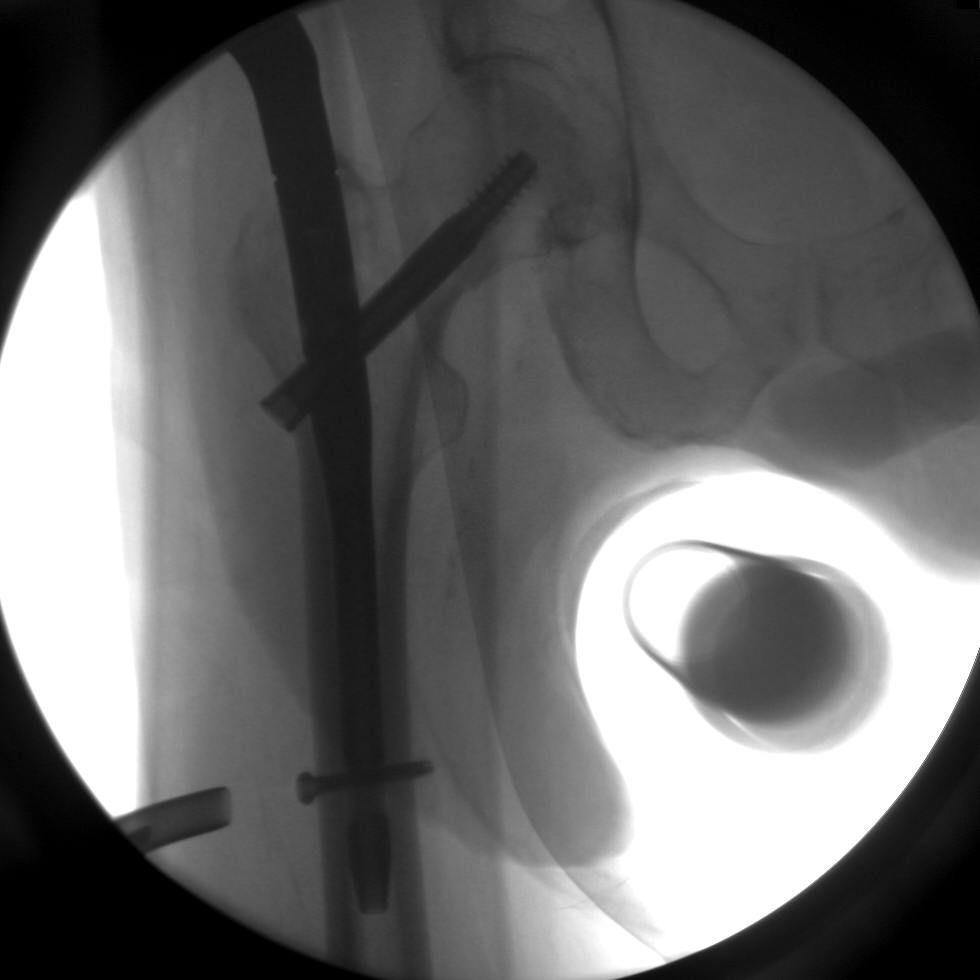
[im 2/3]
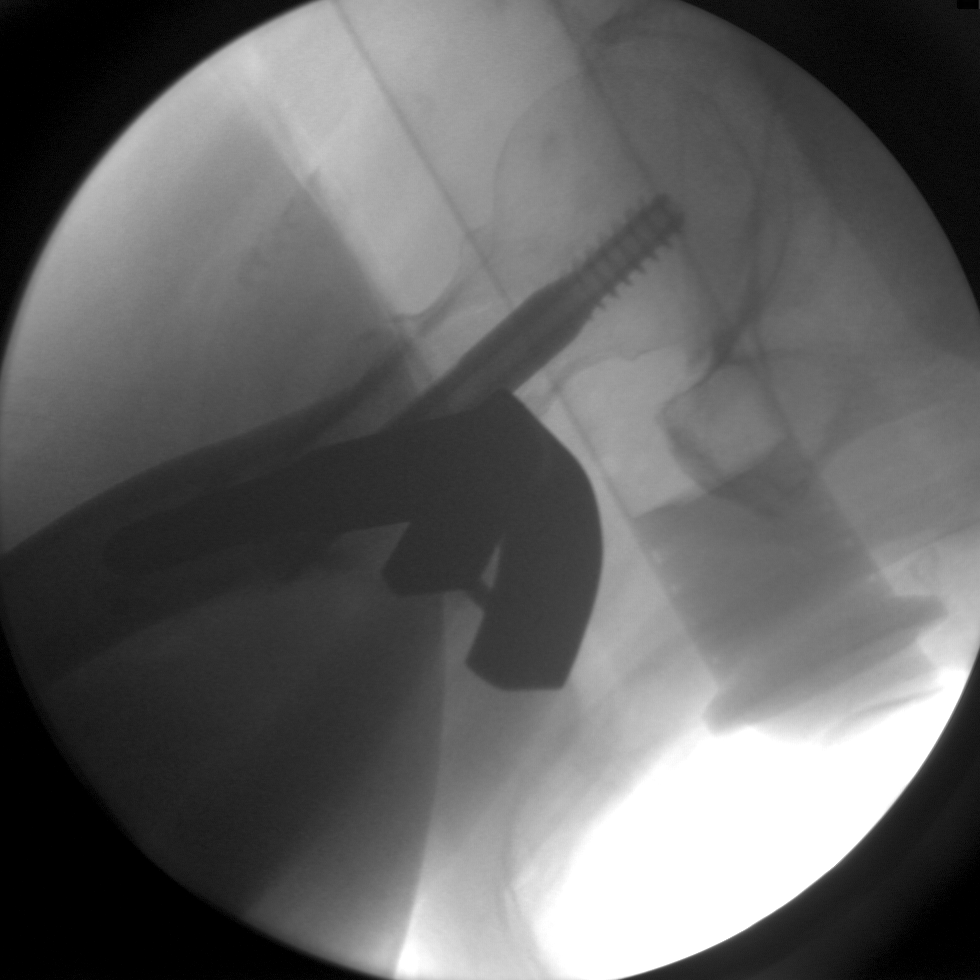
[im 3/3]
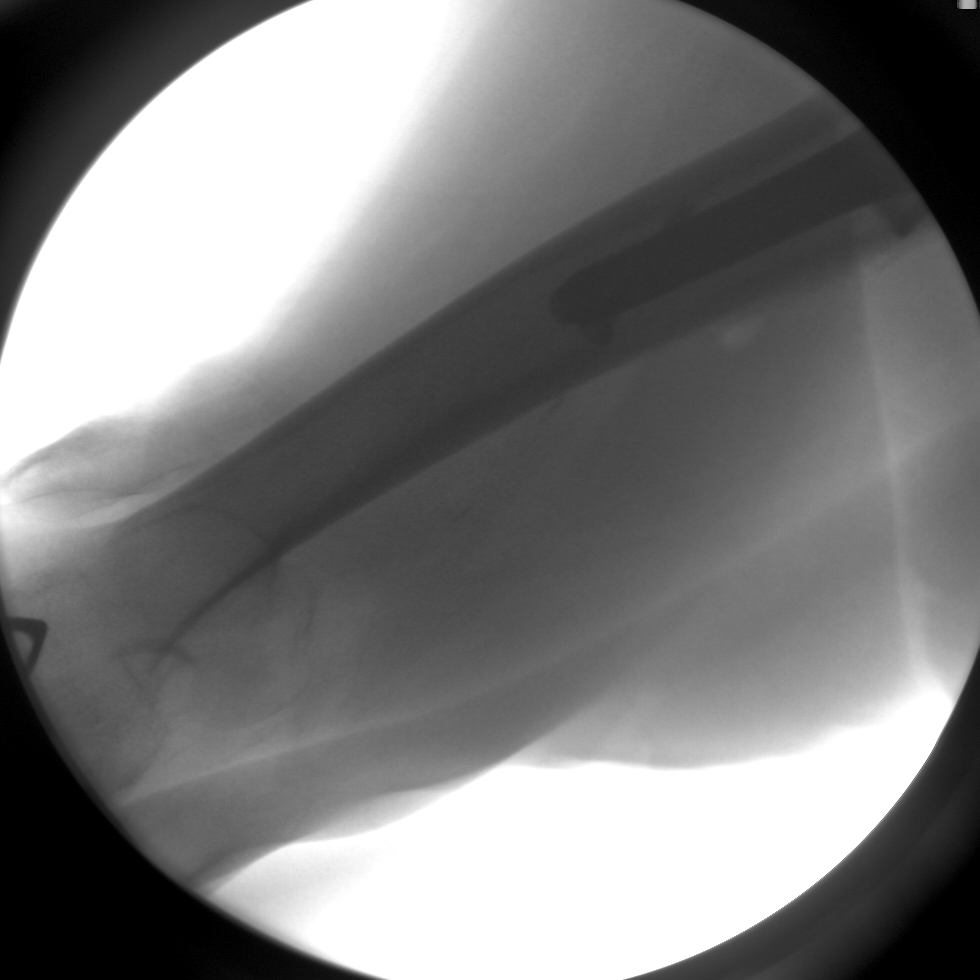

[3 of 3 positions shown; findings below may reference images not displayed]

FINDINGS: Three fluoroscopic C-arm images from the OR demonstrates interval
placement of an intramedullary rod and screw, transfixing the
intertrochanteric fracture through the proximal right femur in
grossly anatomic alignment. No knee fractures are seen. The hardware
appears grossly intact. The right femoral head remains seated at the
acetabulum.
IMPRESSION: Successful internal fixation of right femoral intertrochanteric
fracture, in grossly anatomic alignment.

## 2015-02-22 IMAGING — CR DG CHEST 1V
1 series · 1 of 1 positions shown · non-contrast
Comparison: Radiographs 02/28/2014 and 02/03/2014.

CLINICAL DATA: Pre-op for right hip arthroplasty.

EXAM:
CHEST - 1 VIEW

[x chest ap]
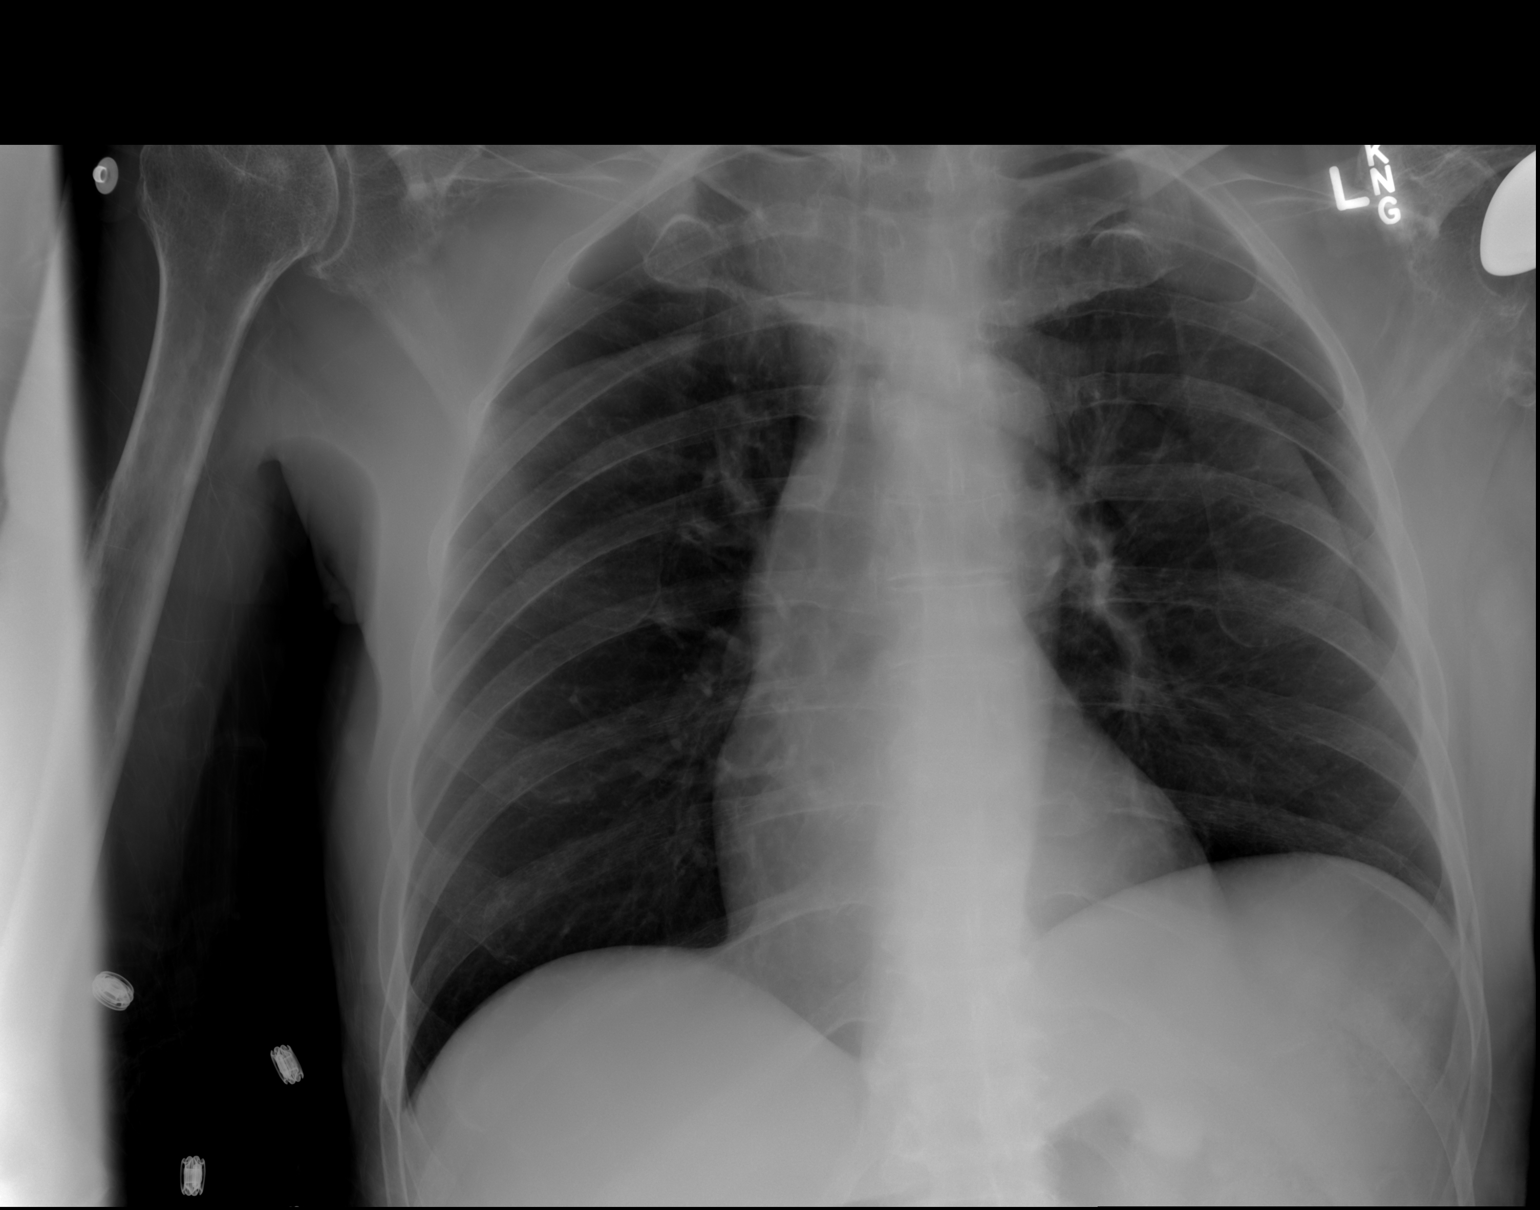

[1 of 1 positions shown; findings below may reference images not displayed]

FINDINGS: 2412 hr. The heart size and mediastinal contours are stable. The
lungs are clear with improved basilar aeration bilaterally. No
pleural effusion or pneumothorax is demonstrated. There are
prominent skin folds overlying the mid chest bilaterally. Patient is
status post left shoulder arthroplasty.
IMPRESSION: No active cardiopulmonary process.
